# Patient Record
Sex: Male | Born: 1955 | Race: White | Hispanic: No | Marital: Single | Smoking: Former smoker
Health system: Southern US, Community
[De-identification: ages and names within clinical notes are randomized; demographics above are authoritative.]

## PROBLEM LIST (undated history)

## (undated) DIAGNOSIS — I2510 Atherosclerotic heart disease of native coronary artery without angina pectoris: Secondary | ICD-10-CM

## (undated) DIAGNOSIS — I5042 Chronic combined systolic (congestive) and diastolic (congestive) heart failure: Secondary | ICD-10-CM

## (undated) DIAGNOSIS — R911 Solitary pulmonary nodule: Secondary | ICD-10-CM

## (undated) DIAGNOSIS — K8020 Calculus of gallbladder without cholecystitis without obstruction: Secondary | ICD-10-CM

## (undated) DIAGNOSIS — G459 Transient cerebral ischemic attack, unspecified: Secondary | ICD-10-CM

## (undated) DIAGNOSIS — Z972 Presence of dental prosthetic device (complete) (partial): Secondary | ICD-10-CM

## (undated) DIAGNOSIS — I779 Disorder of arteries and arterioles, unspecified: Secondary | ICD-10-CM

## (undated) DIAGNOSIS — J449 Chronic obstructive pulmonary disease, unspecified: Secondary | ICD-10-CM

## (undated) DIAGNOSIS — E785 Hyperlipidemia, unspecified: Secondary | ICD-10-CM

## (undated) DIAGNOSIS — I10 Essential (primary) hypertension: Secondary | ICD-10-CM

## (undated) DIAGNOSIS — I447 Left bundle-branch block, unspecified: Secondary | ICD-10-CM

## (undated) DIAGNOSIS — I739 Peripheral vascular disease, unspecified: Secondary | ICD-10-CM

## (undated) DIAGNOSIS — I219 Acute myocardial infarction, unspecified: Secondary | ICD-10-CM

## (undated) HISTORY — DX: Chronic combined systolic (congestive) and diastolic (congestive) heart failure: I50.42

## (undated) HISTORY — DX: Hyperlipidemia, unspecified: E78.5

## (undated) HISTORY — PX: MULTIPLE TOOTH EXTRACTIONS: SHX2053

## (undated) HISTORY — DX: Atherosclerotic heart disease of native coronary artery without angina pectoris: I25.10

## (undated) HISTORY — DX: Left bundle-branch block, unspecified: I44.7

## (undated) HISTORY — DX: Peripheral vascular disease, unspecified: I73.9

## (undated) HISTORY — DX: Calculus of gallbladder without cholecystitis without obstruction: K80.20

## (undated) HISTORY — DX: Essential (primary) hypertension: I10

## (undated) HISTORY — DX: Solitary pulmonary nodule: R91.1

## (undated) HISTORY — DX: Chronic obstructive pulmonary disease, unspecified: J44.9

## (undated) HISTORY — DX: Transient cerebral ischemic attack, unspecified: G45.9

## (undated) HISTORY — PX: CORONARY ANGIOPLASTY WITH STENT PLACEMENT: SHX49

## (undated) HISTORY — DX: Acute myocardial infarction, unspecified: I21.9

## (undated) HISTORY — DX: Disorder of arteries and arterioles, unspecified: I77.9

---

## 2017-10-15 DIAGNOSIS — J449 Chronic obstructive pulmonary disease, unspecified: Secondary | ICD-10-CM

## 2017-10-15 DIAGNOSIS — K219 Gastro-esophageal reflux disease without esophagitis: Secondary | ICD-10-CM

## 2017-10-15 DIAGNOSIS — I208 Other forms of angina pectoris: Secondary | ICD-10-CM

## 2017-10-15 DIAGNOSIS — I2510 Atherosclerotic heart disease of native coronary artery without angina pectoris: Secondary | ICD-10-CM

## 2017-10-15 DIAGNOSIS — I10 Essential (primary) hypertension: Secondary | ICD-10-CM

## 2017-10-15 DIAGNOSIS — R0609 Other forms of dyspnea: Secondary | ICD-10-CM

## 2017-10-21 ENCOUNTER — Encounter: Payer: Self-pay | Admitting: *Deleted

## 2017-10-21 DIAGNOSIS — R079 Chest pain, unspecified: Secondary | ICD-10-CM

## 2017-10-21 DIAGNOSIS — J449 Chronic obstructive pulmonary disease, unspecified: Secondary | ICD-10-CM | POA: Insufficient documentation

## 2017-10-21 DIAGNOSIS — R0789 Other chest pain: Secondary | ICD-10-CM | POA: Insufficient documentation

## 2017-10-21 DIAGNOSIS — I219 Acute myocardial infarction, unspecified: Secondary | ICD-10-CM | POA: Insufficient documentation

## 2017-10-21 DIAGNOSIS — I509 Heart failure, unspecified: Secondary | ICD-10-CM | POA: Insufficient documentation

## 2017-10-21 DIAGNOSIS — Z972 Presence of dental prosthetic device (complete) (partial): Secondary | ICD-10-CM | POA: Insufficient documentation

## 2017-10-21 DIAGNOSIS — I2510 Atherosclerotic heart disease of native coronary artery without angina pectoris: Secondary | ICD-10-CM

## 2017-10-21 DIAGNOSIS — I25119 Atherosclerotic heart disease of native coronary artery with unspecified angina pectoris: Secondary | ICD-10-CM

## 2017-10-21 DIAGNOSIS — I110 Hypertensive heart disease with heart failure: Secondary | ICD-10-CM

## 2017-10-21 DIAGNOSIS — I10 Essential (primary) hypertension: Secondary | ICD-10-CM

## 2017-10-21 HISTORY — DX: Atherosclerotic heart disease of native coronary artery without angina pectoris: I25.10

## 2017-10-21 HISTORY — DX: Essential (primary) hypertension: I10

## 2017-10-21 HISTORY — DX: Chronic obstructive pulmonary disease, unspecified: J44.9

## 2017-10-21 HISTORY — DX: Atherosclerotic heart disease of native coronary artery with unspecified angina pectoris: I25.119

## 2017-10-21 HISTORY — DX: Hypertensive heart disease with heart failure: I11.0

## 2017-10-21 HISTORY — DX: Chest pain, unspecified: R07.9

## 2017-10-21 HISTORY — DX: Acute myocardial infarction, unspecified: I21.9

## 2017-10-21 HISTORY — DX: Other chest pain: R07.89

## 2017-10-26 ENCOUNTER — Encounter: Payer: Self-pay | Admitting: Cardiology

## 2017-10-26 DIAGNOSIS — I5042 Chronic combined systolic (congestive) and diastolic (congestive) heart failure: Secondary | ICD-10-CM | POA: Insufficient documentation

## 2017-10-26 DIAGNOSIS — I447 Left bundle-branch block, unspecified: Secondary | ICD-10-CM | POA: Insufficient documentation

## 2017-10-26 DIAGNOSIS — I429 Cardiomyopathy, unspecified: Secondary | ICD-10-CM

## 2017-10-26 HISTORY — DX: Chronic combined systolic (congestive) and diastolic (congestive) heart failure: I50.42

## 2017-10-26 HISTORY — DX: Cardiomyopathy, unspecified: I42.9

## 2017-10-26 NOTE — H&P (View-Only) (Signed)
Cardiology Office Note:    Date:  10/27/2017    ID:  Lance Ayala, DOB 10/08/1956, MRN [id number]  PCP:  [name], Manjeet Kaur, MD  Cardiologist:  Brian Munley, MD   Referring MD: No ref. provider found  ASSESSMENT:    1. Chronic systolic heart failure (HCC)   2. Coronary artery disease involving native coronary artery of native heart, angina presence unspecified   3. LBBB (left bundle branch block)   4. Cardiomyopathy, unspecified type (HCC)    PLAN:    In order of problems listed above:  1. Heart failure is presently compensated continue current medications he will undergo coronary angiography for ischemia evaluation and subsequently consideration of both CRT and ICD therapy. 2. Referred for coronary angiography 3. Stable pattern on EKG 4. Severe cardiomyopathy I requested copies of his coronary angiography reports from [facility].  Next appointment   Medication Adjustments/Labs and Tests Ordered: Current medicines are reviewed at length with the patient today.  Concerns regarding medicines are outlined above.  No orders of the defined types were placed in this encounter.  No orders of the defined types were placed in this encounter.    Chief Complaint  Patient presents with  . Follow-up    after recent [facility] admission  . Congestive Heart Failure  . Cardiomyopathy  . Coronary Artery Disease  2 weeks  History of Present Illness:    Lance Ayala is a 61 y.o. male who is being seen today for the evaluation of chest pain and decompensated heart failutre with EF 25% after [facility] admission 10/15/17.  at the request of [facility] hospitalist  Dr Prashant Singh. He was seen by cardiology at [facility] underwent a myocardial perfusion study showing no ischemia and was advised a LifeVest he declined and arrangements made for outpatient cardiology follow-up.  I reviewed [facility] records prior to the visit including EKG echo myocardial perfusion study labs  chest x-ray emergency room admission note cardiology consultation and discharge summary.  There are no records in our electronic health system from previous cardiology evaluation although he does have a diagnosis of coronary artery disease.  He is seen in the office post recent hospitalization.  I do not have access to records but he tells me he was last seen by me 3-4 years ago in 4-5 years ago at heart attack heart catheterization and PCI with multiple stents at [facility].  He had done well until recently when he did orthopnea dyspnea on exertion and subsequently dyspnea at rest.  He presented to [facility] with decompensated heart failure left bundle branch block and severely reduced ejection fraction.  He had no evidence of acute coronary syndrome but he does acknowledge when he is short of breath he would have chest tightness.  A myocardial perfusion study did not show ischemia.  He feels improved now but has just admit that he is weakened and not yet ready to return to work.  He is having no edema orthopnea PND chest pain palpitation or syncope.  After discussion he understands the gravity of his severe cardiomyopathy and heart failure with CAD.  I asked him to undergo coronary angiography as he is at risk for balance ischemia and I am not willing to accept a perfusion study is not adequate ischemia evaluation.  If he does not require further revascularization he should be referred for consideration of CRT ICD ICD therapy and for now we will continue his current medical treatment including beta-blocker ACE   and diuretic.  We will see him back in the office will address upgrading to ARNI.  He was given instructions and daily weights compliance of medications and sodium restriction Past Medical History:  Diagnosis Date  . CAD (coronary artery disease) 10/21/2017  . Chest pain 10/21/2017  . Chest tightness 10/21/2017  . Cholelithiasis   . Congestive heart failure (CHF) (HCC)  10/21/2017  . COPD (chronic obstructive pulmonary disease) (HCC) 10/21/2017  . Hypertension 10/21/2017  . Lung nodule   . Myocardial infarction (HCC) 10/21/2017    Past Surgical History:  Procedure Laterality Date  . CORONARY ANGIOPLASTY WITH STENT PLACEMENT     2 stents    Current Medications: Current Meds  Medication Sig  . aspirin 325 MG tablet Take 325 mg by mouth daily.  . atorvastatin (LIPITOR) 80 MG tablet Take 80 mg by mouth daily.  . carvedilol (COREG) 3.125 MG tablet Take 3.125 mg by mouth 2 (two) times daily with a meal.  . furosemide (LASIX) 40 MG tablet Take 40 mg by mouth daily.  . lisinopril (PRINIVIL,ZESTRIL) 10 MG tablet Take 10 mg by mouth at bedtime.  . nitroGLYCERIN (NITROSTAT) 0.4 MG SL tablet Place 0.4 mg under the tongue every 5 (five) minutes as needed for chest pain.  . spironolactone (ALDACTONE) 25 MG tablet Take 25 mg by mouth daily.     Allergies:   Patient has no known allergies.   Social History   Socioeconomic History  . Marital status: Unknown    Spouse name: None  . Number of children: None  . Years of education: None  . Highest education level: None  Social Needs  . Financial resource strain: None  . Food insecurity - worry: None  . Food insecurity - inability: None  . Transportation needs - medical: None  . Transportation needs - non-medical: None  Occupational History  . None  Tobacco Use  . Smoking status: Former Smoker  . Smokeless tobacco: Never Used  Substance and Sexual Activity  . Alcohol use: No    Frequency: Never  . Drug use: No  . Sexual activity: None  Other Topics Concern  . None  Social History Narrative  . None     Family History: The patient's family history includes Hypertension in his mother; Stroke in his mother.  ROS:   ROS Please see the history of present illness.     All other systems reviewed and are negative.  EKGs/Labs/Other Studies Reviewed:    The following studies were reviewed  today:  EKG today SRTH LBBB QRSD 138 msecs EKG:  EKG 10/15/17 SRTH LBBB QRSD 136 msecs Echo 10/16/17 EF 25%and mild MR MPI: EF 25%, fixed defect no ischemia Recent Labs: K 4.3, Cr 1.2,troponin 0.04-0.08, BNP 2920, Chol 195, LDL 124CBC is normal   Physical Exam:    VS:  BP 110/78 (BP Location: Right Arm, Patient Position: Sitting, Cuff Size: Normal)   Pulse 78   Ht 5' 6" (1.676 m)   Wt 170 lb (77.1 kg)   SpO2 98%   BMI 27.44 kg/m     Wt Readings from Last 3 Encounters:  10/27/17 170 lb (77.1 kg)     GEN:  Well nourished, well developed in no acute distress HEENT: Normal NECK: No JVD; No carotid bruits LYMPHATICS: No lymphadenopathy CARDIAC: Paradoxical second heart sound no S3 no murmur S1 is soft RRR, no murmurs, rubs, gallops RESPIRATORY:  Clear to auscultation without rales, wheezing or rhonchi  ABDOMEN: Soft, non-tender, non-distended MUSCULOSKELETAL:    No edema; No deformity  SKIN: Warm and dry NEUROLOGIC:  Alert and oriented x 3 PSYCHIATRIC:  Normal affect     Signed, Brian Munley, MD  10/27/2017 8:44 AM    [facility] 

## 2017-10-26 NOTE — Progress Notes (Signed)
Cardiology Office Note:    Date:  10/27/2017    ID:  Lance Ayala, DOB 22-May-1956, MRN [id number]  PCP:  Anselmo Pickler, MD  Cardiologist:  Norman Herrlich, MD   Referring MD: No ref. provider found  ASSESSMENT:    1. Chronic systolic heart failure (HCC)   2. Coronary artery disease involving native coronary artery of native heart, angina presence unspecified   3. LBBB (left bundle branch block)   4. Cardiomyopathy, unspecified type (HCC)    PLAN:    In order of problems listed above:  1. Heart failure is presently compensated continue current medications he will undergo coronary angiography for ischemia evaluation and subsequently consideration of both CRT and ICD therapy. 2. Referred for coronary angiography 3. Stable pattern on EKG 4. Severe cardiomyopathy I requested copies of his coronary angiography reports from [facility] hospital.  Next appointment   Medication Adjustments/Labs and Tests Ordered: Current medicines are reviewed at length with the patient today.  Concerns regarding medicines are outlined above.  No orders of the defined types were placed in this encounter.  No orders of the defined types were placed in this encounter.    Chief Complaint  Patient presents with  . Follow-up    after recent [facility] admission  . Congestive Heart Failure  . Cardiomyopathy  . Coronary Artery Disease  2 weeks  History of Present Illness:    Lance Ayala is a 61 y.o. male who is being seen today for the evaluation of chest pain and decompensated heart failutre with EF 25% after [facility] admission 10/15/17.  at the request of [facility] hospitalist  Dr Susa Raring. He was seen by cardiology at [facility] underwent a myocardial perfusion study showing no ischemia and was advised a LifeVest he declined and arrangements made for outpatient cardiology follow-up.  I reviewed [facility] health records prior to the visit including EKG echo myocardial perfusion study labs  chest x-ray emergency room admission note cardiology consultation and discharge summary.  There are no records in our electronic health system from previous cardiology evaluation although he does have a diagnosis of coronary artery disease.  He is seen in the office post recent hospitalization.  I do not have access to records but he tells me he was last seen by me 3-4 years ago in 4-5 years ago at heart attack heart catheterization and PCI with multiple stents at [facility] hospital.  He had done well until recently when he did orthopnea dyspnea on exertion and subsequently dyspnea at rest.  He presented to [facility] with decompensated heart failure left bundle branch block and severely reduced ejection fraction.  He had no evidence of acute coronary syndrome but he does acknowledge when he is short of breath he would have chest tightness.  A myocardial perfusion study did not show ischemia.  He feels improved now but has just admit that he is weakened and not yet ready to return to work.  He is having no edema orthopnea PND chest pain palpitation or syncope.  After discussion he understands the gravity of his severe cardiomyopathy and heart failure with CAD.  I asked him to undergo coronary angiography as he is at risk for balance ischemia and I am not willing to accept a perfusion study is not adequate ischemia evaluation.  If he does not require further revascularization he should be referred for consideration of CRT ICD ICD therapy and for now we will continue his current medical treatment including beta-blocker ACE  and diuretic.  We will see him back in the office will address upgrading to ARNI.  He was given instructions and daily weights compliance of medications and sodium restriction Past Medical History:  Diagnosis Date  . CAD (coronary artery disease) 10/21/2017  . Chest pain 10/21/2017  . Chest tightness 10/21/2017  . Cholelithiasis   . Congestive heart failure (CHF) (HCC)  10/21/2017  . COPD (chronic obstructive pulmonary disease) (HCC) 10/21/2017  . Hypertension 10/21/2017  . Lung nodule   . Myocardial infarction (HCC) 10/21/2017    Past Surgical History:  Procedure Laterality Date  . CORONARY ANGIOPLASTY WITH STENT PLACEMENT     2 stents    Current Medications: Current Meds  Medication Sig  . aspirin 325 MG tablet Take 325 mg by mouth daily.  Marland Kitchen. atorvastatin (LIPITOR) 80 MG tablet Take 80 mg by mouth daily.  . carvedilol (COREG) 3.125 MG tablet Take 3.125 mg by mouth 2 (two) times daily with a meal.  . furosemide (LASIX) 40 MG tablet Take 40 mg by mouth daily.  Marland Kitchen. lisinopril (PRINIVIL,ZESTRIL) 10 MG tablet Take 10 mg by mouth at bedtime.  . nitroGLYCERIN (NITROSTAT) 0.4 MG SL tablet Place 0.4 mg under the tongue every 5 (five) minutes as needed for chest pain.  Marland Kitchen. spironolactone (ALDACTONE) 25 MG tablet Take 25 mg by mouth daily.     Allergies:   Patient has no known allergies.   Social History   Socioeconomic History  . Marital status: Unknown    Spouse name: None  . Number of children: None  . Years of education: None  . Highest education level: None  Social Needs  . Financial resource strain: None  . Food insecurity - worry: None  . Food insecurity - inability: None  . Transportation needs - medical: None  . Transportation needs - non-medical: None  Occupational History  . None  Tobacco Use  . Smoking status: Former Games developermoker  . Smokeless tobacco: Never Used  Substance and Sexual Activity  . Alcohol use: No    Frequency: Never  . Drug use: No  . Sexual activity: None  Other Topics Concern  . None  Social History Narrative  . None     Family History: The patient's family history includes Hypertension in his mother; Stroke in his mother.  ROS:   ROS Please see the history of present illness.     All other systems reviewed and are negative.  EKGs/Labs/Other Studies Reviewed:    The following studies were reviewed  today:  EKG today SRTH LBBB QRSD 138 msecs EKG:  EKG 10/15/17 SRTH LBBB QRSD 136 msecs Echo 10/16/17 EF 25%and mild MR MPI: EF 25%, fixed defect no ischemia Recent Labs: K 4.3, Cr 1.2,troponin 0.04-0.08, BNP 2920, Chol 195, LDL 124CBC is normal   Physical Exam:    VS:  BP 110/78 (BP Location: Right Arm, Patient Position: Sitting, Cuff Size: Normal)   Pulse 78   Ht 5\' 6"  (1.676 m)   Wt 170 lb (77.1 kg)   SpO2 98%   BMI 27.44 kg/m     Wt Readings from Last 3 Encounters:  10/27/17 170 lb (77.1 kg)     GEN:  Well nourished, well developed in no acute distress HEENT: Normal NECK: No JVD; No carotid bruits LYMPHATICS: No lymphadenopathy CARDIAC: Paradoxical second heart sound no S3 no murmur S1 is soft RRR, no murmurs, rubs, gallops RESPIRATORY:  Clear to auscultation without rales, wheezing or rhonchi  ABDOMEN: Soft, non-tender, non-distended MUSCULOSKELETAL:  No edema; No deformity  SKIN: Warm and dry NEUROLOGIC:  Alert and oriented x 3 PSYCHIATRIC:  Normal affect     Signed, Norman Herrlich, MD  10/27/2017 8:44 AM    [facility]

## 2017-10-27 ENCOUNTER — Ambulatory Visit (HOSPITAL_BASED_OUTPATIENT_CLINIC_OR_DEPARTMENT_OTHER)
Admission: RE | Admit: 2017-10-27 | Discharge: 2017-10-27 | Disposition: A | Discharge status: Home / Self Care | Payer: Self-pay | Source: Ambulatory Visit | Attending: Cardiology | Admitting: Cardiology

## 2017-10-27 ENCOUNTER — Encounter: Payer: Self-pay | Admitting: Cardiology

## 2017-10-27 ENCOUNTER — Telehealth: Payer: Self-pay

## 2017-10-27 ENCOUNTER — Ambulatory Visit: Payer: Self-pay | Admitting: Cardiology

## 2017-10-27 VITALS — BP 110/70 | HR 78 | Ht 66.0 in | Wt 170.0 lb

## 2017-10-27 DIAGNOSIS — I429 Cardiomyopathy, unspecified: Secondary | ICD-10-CM | POA: Insufficient documentation

## 2017-10-27 DIAGNOSIS — Z01810 Encounter for preprocedural cardiovascular examination: Secondary | ICD-10-CM

## 2017-10-27 DIAGNOSIS — I2510 Atherosclerotic heart disease of native coronary artery without angina pectoris: Secondary | ICD-10-CM

## 2017-10-27 DIAGNOSIS — I447 Left bundle-branch block, unspecified: Secondary | ICD-10-CM

## 2017-10-27 DIAGNOSIS — I5022 Chronic systolic (congestive) heart failure: Secondary | ICD-10-CM | POA: Insufficient documentation

## 2017-10-27 MED ORDER — ASPIRIN EC 81 MG PO TBEC: 81.0000 mg | DELAYED_RELEASE_TABLET | Freq: Every day | ORAL | 1 refills | Status: DC

## 2017-10-27 NOTE — Patient Instructions (Addendum)
Medication Instructions:  Your physician has recommended you make the following change in your medication:  DECREASE aspirin to 81 mg daily  Labwork: Your physician recommends that you return for lab work in: today. BMP, BNP, CBC, pt/inr  Testing/Procedures: You had an EKG today.  A chest x-ray takes a picture of the organs and structures inside the chest, including the heart, lungs, and blood vessels. This test can show several things, including, whether the heart is enlarges; whether fluid is building up in the lungs; and whether pacemaker / defibrillator leads are still in place.  Your physician has requested that you have a cardiac catheterization. Cardiac catheterization is used to diagnose and/or treat various heart conditions. Doctors may recommend this procedure for a number of different reasons. The most common reason is to evaluate chest pain. Chest pain can be a symptom of coronary artery disease (CAD), and cardiac catheterization can show whether plaque is narrowing or blocking your heart's arteries. This procedure is also used to evaluate the valves, as well as measure the blood flow and oxygen levels in different parts of your heart. For further information please visit [email or web address] Please follow instruction sheet, as given.  Follow-Up: Your physician recommends that you schedule a follow-up appointment in: 2 weeks.  Any Other Special Instructions Will Be Listed Below (If Applicable).     If you need a refill on your cardiac medications before your next appointment, please call your pharmacy.      [facility] CARDIOVASCULAR DIVISION [facility] [place] [address], [address] [place] [address] Dept: [phone] Loc: [phone]  Lance Ayala  10/27/2017  You are scheduled for a Cardiac Catheterization on Thursday, November 29 with Dr. Tonny Bollman.  1. Please arrive at the [facility] (Main Entrance A) at  [facility]: [address] [place], [place] [address] at 5:30 AM (two hours before your procedure to ensure your preparation). Free valet parking service is available.   Special note: Every effort is made to have your procedure done on time. Please understand that emergencies sometimes delay scheduled procedures.  2. Diet: Do not eat or drink anything after midnight prior to your procedure except sips of water to take medications.  3. Labs: None needed. You are having them today.  4. Medication instructions in preparation for your procedure:  On the morning of your procedure, take your Aspirin and any morning medicines NOT listed above.  You may use sips of water.  5. Plan for one night stay--bring personal belongings. 6. Bring a current list of your medications and current insurance cards. 7. You MUST have a responsible person to drive you home. 8. Someone MUST be with you the first 24 hours after you arrive home or your discharge will be delayed. 9. Please wear clothes that are easy to get on and off and wear slip-on shoes.  Thank you for allowing [place] to care for you!   -- [facility]     Heart Failure  Weigh yourself every morning when you first wake up and record on a calender or note pad, bring this to your office visits. Using a pill tender can help with taking your medications consistently.  Limit your fluid intake to 2 liters daily  Limit your sodium intake to less than 2-3 grams daily. Ask if you need dietary teaching.  If you gain more than 3 pounds (from your dry weight ), double your dose of diuretic for  the day.  If you gain more than 5 pounds (from your dry weight), double your dose of lasix and call your heart failure doctor.  Please do not smoke tobacco since it is very bad for your heart.  Please do not drink alcohol since it can worsen your heart failure.Also avoid OTC nonsteroidal drugs, such as advil, aleve and  motrin.  Try to exercise for at least 30 minutes every day because this will help your heart be more efficient. You may be eligible for supervised cardiac rehab, ask your physician.    DASH Diet: Care Instructions Your Care Instructions The DASH diet is an eating plan that can help lower your blood pressure. DASH stands for Dietary Approaches to Stop Hypertension. Hypertension is high blood pressure. The DASH diet focuses on eating foods that are high in calcium, potassium, and magnesium. These nutrients can lower blood pressure. The foods that are highest in these nutrients are fruits, vegetables, low-fat dairy products, nuts, seeds, and legumes. But taking calcium, potassium, and magnesium supplements instead of eating foods that are high in those nutrients does not have the same effect. The DASH diet also includes whole grains, fish, and poultry. The DASH diet is one of several lifestyle changes your doctor may recommend to lower your high blood pressure. Your doctor may also want you to decrease the amount of sodium in your diet. Lowering sodium while following the DASH diet can lower blood pressure even further than just the DASH diet alone. Follow-up care is a key part of your treatment and safety. Be sure to make and go to all appointments, and call your doctor if you are having problems. It's also a good idea to know your test results and keep a list of the medicines you take. How can you care for yourself at home? Following the DASH diet  Eat 4 to 5 servings of fruit each day. A serving is 1 medium-sized piece of fruit,  cup chopped or canned fruit, 1/4 cup dried fruit, or 4 ounces ( cup) of fruit juice. Choose fruit more often than fruit juice.  Eat 4 to 5 servings of vegetables each day. A serving is 1 cup of lettuce or raw leafy vegetables,  cup of chopped or cooked vegetables, or 4 ounces ( cup) of vegetable juice. Choose vegetables more often than vegetable juice.  Get 2 to 3  servings of low-fat and fat-free dairy each day. A serving is 8 ounces of milk, 1 cup of yogurt, or 1  ounces of cheese.  Eat 6 to 8 servings of grains each day. A serving is 1 slice of bread, 1 ounce of dry cereal, or  cup of cooked rice, pasta, or cooked cereal. Try to choose whole-grain products as much as possible.  Limit lean meat, poultry, and fish to 2 servings each day. A serving is 3 ounces, about the size of a deck of cards.  Eat 4 to 5 servings of nuts, seeds, and legumes (cooked dried beans, lentils, and split peas) each week. A serving is 1/3 cup of nuts, 2 tablespoons of seeds, or  cup of cooked beans or peas.  Limit fats and oils to 2 to 3 servings each day. A serving is 1 teaspoon of vegetable oil or 2 tablespoons of salad dressing.  Limit sweets and added sugars to 5 servings or less a week. A serving is 1 tablespoon jelly or jam,  cup sorbet, or 1 cup of lemonade.  Eat less than 2,300 milligrams (mg)  of sodium a day. If you limit your sodium to 1,500 mg a day, you can lower your blood pressure even more. Tips for success  Start small. Do not try to make dramatic changes to your diet all at once. You might feel that you are missing out on your favorite foods and then be more likely to not follow the plan. Make small changes, and stick with them. Once those changes become habit, add a few more changes.  Try some of the following:  Make it a goal to eat a fruit or vegetable at every meal and at snacks. This will make it easy to get the recommended amount of fruits and vegetables each day.  Try yogurt topped with fruit and nuts for a snack or healthy dessert.  Add lettuce, tomato, cucumber, and onion to sandwiches.  Combine a ready-made pizza crust with low-fat mozzarella cheese and lots of vegetable toppings. Try using tomatoes, squash, spinach, broccoli, carrots, cauliflower, and onions.  Have a variety of cut-up vegetables with a low-fat dip as an appetizer instead of  chips and dip.  Sprinkle sunflower seeds or chopped almonds over salads. Or try adding chopped walnuts or almonds to cooked vegetables.  Try some vegetarian meals using beans and peas. Add garbanzo or kidney beans to salads. Make burritos and tacos with mashed pinto beans or black beans. Where can you learn more? Go to https://carlson-fletcher.info/. Enter 276-492-9697 in the search box to learn more about "DASH Diet: Care Instructions." Current as of: February 21, 2015 Content Version: 11.1  2006-2016 [employer]. Care instructions adapted under license by [facility]. If you have questions about a medical condition or this instruction, always ask your healthcare professional. [employer] disclaims any warranty or liability for your use of this information.

## 2017-10-27 NOTE — Telephone Encounter (Signed)
Detailed message left per DPR:  Patient contacted pre-catheterization at [facility] scheduled for:  10/29/2017 @ 0730 Verified arrival time and place:  [facility] @ 0530 Confirmed AM meds to be taken pre-cath with sip of water: Take ASA Hold lasix/spironolactone Patient must have responsible person to drive home post procedure and observe patient for 24 hours Addl concerns:  Left this nurse name and # for call back if any questions

## 2017-10-28 LAB — CBC WITH DIFFERENTIAL/PLATELET
BASOS ABS: 0 10*3/uL (ref 0.0–0.2)
Basos: 0 %
EOS (ABSOLUTE): 0.1 10*3/uL (ref 0.0–0.4)
Eos: 1 %
HEMOGLOBIN: 15 g/dL (ref 13.0–17.7)
Hematocrit: 43.9 % (ref 37.5–51.0)
Immature Grans (Abs): 0 10*3/uL (ref 0.0–0.1)
Immature Granulocytes: 0 %
LYMPHS ABS: 2.1 10*3/uL (ref 0.7–3.1)
Lymphs: 22 %
MCH: 31.3 pg (ref 26.6–33.0)
MCHC: 34.2 g/dL (ref 31.5–35.7)
MCV: 92 fL (ref 79–97)
MONOCYTES: 9 %
Monocytes Absolute: 0.8 10*3/uL (ref 0.1–0.9)
NEUTROS ABS: 6.4 10*3/uL (ref 1.4–7.0)
Neutrophils: 68 %
PLATELETS: 291 10*3/uL (ref 150–379)
RBC: 4.79 x10E6/uL (ref 4.14–5.80)
RDW: 13.1 % (ref 12.3–15.4)
WBC: 9.4 10*3/uL (ref 3.4–10.8)

## 2017-10-28 LAB — BASIC METABOLIC PANEL
BUN/Creatinine Ratio: 15 (ref 10–24)
BUN: 19 mg/dL (ref 8–27)
CHLORIDE: 97 mmol/L (ref 96–106)
CO2: 28 mmol/L (ref 20–29)
CREATININE: 1.28 mg/dL — AB (ref 0.76–1.27)
Calcium: 9.9 mg/dL (ref 8.6–10.2)
GFR calc Af Amer: 69 mL/min/{1.73_m2} (ref >59)
GFR calc non Af Amer: 60 mL/min/{1.73_m2} (ref >59)
GLUCOSE: 115 mg/dL — AB (ref 65–99)
Potassium: 4.2 mmol/L (ref 3.5–5.2)
SODIUM: 139 mmol/L (ref 134–144)

## 2017-10-28 LAB — PROTIME-INR
INR: 1 (ref 0.8–1.2)
PROTHROMBIN TIME: 10.3 s (ref 9.1–12.0)

## 2017-10-28 LAB — PRO B NATRIURETIC PEPTIDE: NT-PRO BNP: 1072 pg/mL — AB (ref 0–210)

## 2017-10-29 ENCOUNTER — Encounter (HOSPITAL_COMMUNITY): Payer: Self-pay | Admitting: Cardiovascular Disease

## 2017-10-29 ENCOUNTER — Encounter (HOSPITAL_COMMUNITY)
Admission: RE | Disposition: A | Discharge status: Home / Self Care | Payer: Self-pay | Source: Ambulatory Visit | Attending: Cardiovascular Disease

## 2017-10-29 ENCOUNTER — Ambulatory Visit (HOSPITAL_COMMUNITY)
Admission: RE | Admit: 2017-10-29 | Discharge: 2017-10-29 | Disposition: A | Discharge status: Home / Self Care | Payer: Medicaid Other | Source: Ambulatory Visit | Attending: Cardiovascular Disease | Admitting: Cardiovascular Disease

## 2017-10-29 DIAGNOSIS — I110 Hypertensive heart disease with heart failure: Secondary | ICD-10-CM | POA: Diagnosis not present

## 2017-10-29 DIAGNOSIS — I2584 Coronary atherosclerosis due to calcified coronary lesion: Secondary | ICD-10-CM | POA: Insufficient documentation

## 2017-10-29 DIAGNOSIS — I447 Left bundle-branch block, unspecified: Secondary | ICD-10-CM | POA: Insufficient documentation

## 2017-10-29 DIAGNOSIS — I252 Old myocardial infarction: Secondary | ICD-10-CM | POA: Diagnosis not present

## 2017-10-29 DIAGNOSIS — Z87891 Personal history of nicotine dependence: Secondary | ICD-10-CM | POA: Insufficient documentation

## 2017-10-29 DIAGNOSIS — Z7982 Long term (current) use of aspirin: Secondary | ICD-10-CM | POA: Diagnosis not present

## 2017-10-29 DIAGNOSIS — J449 Chronic obstructive pulmonary disease, unspecified: Secondary | ICD-10-CM | POA: Diagnosis not present

## 2017-10-29 DIAGNOSIS — Z955 Presence of coronary angioplasty implant and graft: Secondary | ICD-10-CM | POA: Insufficient documentation

## 2017-10-29 DIAGNOSIS — I2510 Atherosclerotic heart disease of native coronary artery without angina pectoris: Secondary | ICD-10-CM | POA: Insufficient documentation

## 2017-10-29 DIAGNOSIS — I509 Heart failure, unspecified: Secondary | ICD-10-CM

## 2017-10-29 DIAGNOSIS — I5022 Chronic systolic (congestive) heart failure: Secondary | ICD-10-CM | POA: Insufficient documentation

## 2017-10-29 DIAGNOSIS — I429 Cardiomyopathy, unspecified: Secondary | ICD-10-CM | POA: Insufficient documentation

## 2017-10-29 HISTORY — PX: LEFT HEART CATH AND CORONARY ANGIOGRAPHY: CATH118249

## 2017-10-29 SURGERY — LEFT HEART CATH AND CORONARY ANGIOGRAPHY
Anesthesia: LOCAL

## 2017-10-29 MED ORDER — SODIUM CHLORIDE 0.9% FLUSH: 3.0000 mL | Freq: Two times a day (BID) | INTRAVENOUS | Status: DC

## 2017-10-29 MED ORDER — VERAPAMIL HCL 2.5 MG/ML IV SOLN
INTRAVENOUS | Status: DC | PRN
  Administered 2017-10-29: 10 mL via INTRA_ARTERIAL

## 2017-10-29 MED ORDER — SODIUM CHLORIDE 0.9 % IV SOLN: 250.0000 mL | INTRAVENOUS | Status: DC | PRN

## 2017-10-29 MED ORDER — VERAPAMIL HCL 2.5 MG/ML IV SOLN
INTRAVENOUS | Status: AC
  Filled 2017-10-29: qty 2

## 2017-10-29 MED ORDER — FENTANYL CITRATE (PF) 100 MCG/2ML IJ SOLN
INTRAMUSCULAR | Status: DC | PRN
  Administered 2017-10-29: 25 ug via INTRAVENOUS

## 2017-10-29 MED ORDER — HEPARIN SODIUM (PORCINE) 1000 UNIT/ML IJ SOLN
INTRAMUSCULAR | Status: AC
  Filled 2017-10-29: qty 1

## 2017-10-29 MED ORDER — ASPIRIN 81 MG PO CHEW: 81.0000 mg | CHEWABLE_TABLET | ORAL | Status: DC

## 2017-10-29 MED ORDER — FENTANYL CITRATE (PF) 100 MCG/2ML IJ SOLN
INTRAMUSCULAR | Status: AC
  Filled 2017-10-29: qty 2

## 2017-10-29 MED ORDER — IOPAMIDOL (ISOVUE-370) INJECTION 76%
INTRAVENOUS | Status: DC | PRN
  Administered 2017-10-29: 55 mL via INTRA_ARTERIAL

## 2017-10-29 MED ORDER — HEPARIN (PORCINE) IN NACL 2-0.9 UNIT/ML-% IJ SOLN
INTRAMUSCULAR | Status: AC | PRN
  Administered 2017-10-29: 1000 mL

## 2017-10-29 MED ORDER — HEPARIN SODIUM (PORCINE) 1000 UNIT/ML IJ SOLN
INTRAMUSCULAR | Status: DC | PRN
  Administered 2017-10-29: 4000 [IU] via INTRAVENOUS

## 2017-10-29 MED ORDER — MIDAZOLAM HCL 2 MG/2ML IJ SOLN
INTRAMUSCULAR | Status: DC | PRN
  Administered 2017-10-29: 2 mg via INTRAVENOUS

## 2017-10-29 MED ORDER — IOPAMIDOL (ISOVUE-370) INJECTION 76%
INTRAVENOUS | Status: AC
  Filled 2017-10-29: qty 100

## 2017-10-29 MED ORDER — LIDOCAINE HCL (PF) 1 % IJ SOLN
INTRAMUSCULAR | Status: DC | PRN
  Administered 2017-10-29: 2 mL via SUBCUTANEOUS

## 2017-10-29 MED ORDER — MIDAZOLAM HCL 2 MG/2ML IJ SOLN
INTRAMUSCULAR | Status: AC
  Filled 2017-10-29: qty 2

## 2017-10-29 MED ORDER — SODIUM CHLORIDE 0.9 % IV SOLN
INTRAVENOUS | Status: DC
  Administered 2017-10-29: 07:00:00 via INTRAVENOUS

## 2017-10-29 MED ORDER — HEPARIN (PORCINE) IN NACL 2-0.9 UNIT/ML-% IJ SOLN
INTRAMUSCULAR | Status: AC
  Filled 2017-10-29: qty 1000

## 2017-10-29 MED ORDER — LIDOCAINE HCL (PF) 1 % IJ SOLN
INTRAMUSCULAR | Status: AC
  Filled 2017-10-29: qty 30

## 2017-10-29 MED ORDER — SODIUM CHLORIDE 0.9% FLUSH: 3.0000 mL | INTRAVENOUS | Status: DC | PRN

## 2017-10-29 SURGICAL SUPPLY — 10 items

## 2017-10-29 NOTE — Interval H&P Note (Signed)
Cath Lab Visit (complete for each Cath Lab visit)  Clinical Evaluation Leading to the Procedure:   ACS: No.  Non-ACS:    Anginal Classification: CCS III  Anti-ischemic medical therapy: Minimal Therapy (1 class of medications)  Non-Invasive Test Results: High-risk stress test findings: cardiac mortality >3%/year  Prior CABG: No previous CABG      History and Physical Interval Note:  10/29/2017 7:41 AM  Lance Ayala  has presented today for surgery, with the diagnosis of hf - cad - cm  The various methods of treatment have been discussed with the patient and family. After consideration of risks, benefits and other options for treatment, the patient has consented to  Procedure(s): LEFT HEART CATH AND CORONARY ANGIOGRAPHY (N/A) as a surgical intervention .  The patient's history has been reviewed, patient examined, no change in status, stable for surgery.  I have reviewed the patient's chart and labs.  Questions were answered to the patient's satisfaction.     Tonny Bollman

## 2017-10-29 NOTE — Discharge Instructions (Signed)

## 2017-11-10 ENCOUNTER — Ambulatory Visit: Payer: Self-pay | Admitting: Cardiology

## 2017-11-12 NOTE — Progress Notes (Signed)
Cardiology Office Note:    Date:  11/13/2017   ID:  Lance Ayala, DOB 1956-10-27, MRN [id number]  PCP:  Anselmo Pickler, MD  Cardiologist:  Norman Herrlich, MD    Referring MD: Anselmo Pickler, *please do a BMP and BNP at her office visit   ASSESSMENT:    1. Coronary artery disease involving native coronary artery of native heart with angina pectoris (HCC)   2. Chronic systolic heart failure (HCC)   3. Hypertensive heart disease with heart failure (HCC)   4. Dyslipidemia    PLAN:    In order of problems listed above:  1. Continue medical treatment for now.  With severe multivessel CAD and left ventricular dysfunction he is referred to CTS has an appointment for this week regarding bypass surgery.  The patient is at peace with this and understands the thought process and agrees to intervention.  His ejection fraction was recovered will provide guidance if he needs to be considered for ICD. 2. Stable compensated continue current medical treatment with loop diuretic beta-blocker ACE inhibitor MRA and if ejection fraction remains depressed after recovery from bypass surgery will transition to ARNI.  Require referral to cardiac rehabilitation both for heart failure and CAD/CABG 3. Stable continue current medical treatment 4. Stable continue high intensity statin with his CAD, I will reassess a lipid profile once he recovers goal LDL less than 50.   Next appointment: 6 weeks anticipated to be after bypass surgery   Medication Adjustments/Labs and Tests Ordered: Current medicines are reviewed at length with the patient today.  Concerns regarding medicines are outlined above.  No orders of the defined types were placed in this encounter.  No orders of the defined types were placed in this encounter.   No chief complaint on file.   History of Present Illness:    Lance Ayala is a 61 y.o. male with a hx of heart failure EF 25% last seen 3 weeks ago and referred for coronary  angiography.. Conclusion     Previously placed Ost RCA to Prox RCA stent (unknown type) is widely patent.  Mid RCA lesion is 60% stenosed.  Dist RCA lesion is 80% stenosed.  Ost LAD lesion is 60% stenosed.  Ost Ramus to Ramus lesion is 90% stenosed.  Ost 1st Mrg to 1st Mrg lesion is 90% stenosed.  Ost 1st Diag to 1st Diag lesion is 75% stenosed.  Prox LAD to Mid LAD lesion is 75% stenosed.  Mid LM to Dist LM lesion is 50% stenosed.  The left ventricular ejection fraction is 25-35% by visual estimate.  LV end diastolic pressure is normal.  There is severe left ventricular systolic dysfunction.   1. Severe multivessel CAD as outlined above with moderate distal left main stenosis, severe ramus/OM1 stenoses, severe RCA stenosis, and severe LAD/diagonal stenosis 2. Severe segmental LV systolic dysfunction 3. Normal LVEDP  Recommend: cardiac surgical consultation for consideration of CABG    Compliance with diet, lifestyle and medications: Yes  Symptomatically he is improved he had further no further anginal discomfort he is weak and fatigued but no shortness of breath edema orthopnea PND palpitations syncope or TIA Past Medical History:  Diagnosis Date  . CAD (coronary artery disease) 10/21/2017  . Chest pain 10/21/2017  . Chest tightness 10/21/2017  . Cholelithiasis   . Congestive heart failure (CHF) (HCC) 10/21/2017  . COPD (chronic obstructive pulmonary disease) (HCC) 10/21/2017  . Hypertension 10/21/2017  . Lung nodule   . Myocardial infarction (HCC) 10/21/2017  Past Surgical History:  Procedure Laterality Date  . CORONARY ANGIOPLASTY WITH STENT PLACEMENT     2 stents  . LEFT HEART CATH AND CORONARY ANGIOGRAPHY N/A 10/29/2017   Procedure: LEFT HEART CATH AND CORONARY ANGIOGRAPHY;  Surgeon: Tonny Bollmanooper, Michael, MD;  [facility] LAB;  Service: Cardiovascular;  Laterality: N/A;    Current Medications: No outpatient medications have been marked as  taking for the 11/13/17 encounter (Appointment) with Baldo DaubMunley, Jerene Yeager J, MD.     Allergies:   Patient has no known allergies.   Social History   Socioeconomic History  . Marital status: Single    Spouse name: Not on file  . Number of children: Not on file  . Years of education: Not on file  . Highest education level: Not on file  Social Needs  . Financial resource strain: Not on file  . Food insecurity - worry: Not on file  . Food insecurity - inability: Not on file  . Transportation needs - medical: Not on file  . Transportation needs - non-medical: Not on file  Occupational History  . Not on file  Tobacco Use  . Smoking status: Former Games developermoker  . Smokeless tobacco: Never Used  Substance and Sexual Activity  . Alcohol use: No    Frequency: Never  . Drug use: No  . Sexual activity: Not on file  Other Topics Concern  . Not on file  Social History Narrative  . Not on file     Family History: The patient's family history includes Hypertension in his mother; Stroke in his mother. ROS:   Please see the history of present illness.    All other systems reviewed and are negative.  EKGs/Labs/Other Studies Reviewed:    The following studies were reviewed today:   Recent Labs: 10/27/2017: BUN 19; Creatinine, Ser 1.28; Hemoglobin 15.0; NT-Pro BNP 1,072; Platelets 291; Potassium 4.2; Sodium 139  Recent Lipid Panel No results found for: CHOL, TRIG, HDL, CHOLHDL, VLDL, LDLCALC, LDLDIRECT  Physical Exam:    VS:  There were no vitals taken for this visit.    Wt Readings from Last 3 Encounters:  10/29/17 170 lb (77.1 kg)  10/27/17 170 lb (77.1 kg)     GEN:  Well nourished, well developed in no acute distress HEENT: Normal NECK: No JVD; No carotid bruits LYMPHATICS: No lymphadenopathy CARDIAC: Soft S1 no S3 present RRR, no murmurs, rubs, gallops RESPIRATORY:  Clear to auscultation without rales, wheezing or rhonchi  ABDOMEN: Soft, non-tender, non-distended MUSCULOSKELETAL:   No edema; No deformity  SKIN: Warm and dry NEUROLOGIC:  Alert and oriented x 3 PSYCHIATRIC:  Normal affect    Signed, Norman HerrlichBrian Laneah Luft, MD  11/13/2017 2:38 PM    [facility]

## 2017-11-13 ENCOUNTER — Encounter: Payer: Self-pay | Admitting: Cardiology

## 2017-11-13 ENCOUNTER — Ambulatory Visit (INDEPENDENT_AMBULATORY_CARE_PROVIDER_SITE_OTHER): Payer: Self-pay | Admitting: Cardiology

## 2017-11-13 VITALS — BP 110/68 | HR 86 | Ht 66.0 in | Wt 170.0 lb

## 2017-11-13 DIAGNOSIS — I110 Hypertensive heart disease with heart failure: Secondary | ICD-10-CM

## 2017-11-13 DIAGNOSIS — E785 Hyperlipidemia, unspecified: Secondary | ICD-10-CM | POA: Insufficient documentation

## 2017-11-13 DIAGNOSIS — I25119 Atherosclerotic heart disease of native coronary artery with unspecified angina pectoris: Secondary | ICD-10-CM

## 2017-11-13 DIAGNOSIS — I5022 Chronic systolic (congestive) heart failure: Secondary | ICD-10-CM

## 2017-11-13 MED ORDER — ATORVASTATIN CALCIUM 80 MG PO TABS: 80.0000 mg | ORAL_TABLET | Freq: Every evening | ORAL | 1 refills | Status: DC

## 2017-11-13 MED ORDER — LISINOPRIL 10 MG PO TABS: 10.0000 mg | ORAL_TABLET | Freq: Every day | ORAL | 1 refills | Status: DC

## 2017-11-13 MED ORDER — SPIRONOLACTONE 25 MG PO TABS: 25.0000 mg | ORAL_TABLET | Freq: Every day | ORAL | 1 refills | Status: DC

## 2017-11-13 MED ORDER — FUROSEMIDE 40 MG PO TABS: 40.0000 mg | ORAL_TABLET | Freq: Every day | ORAL | 1 refills | Status: DC

## 2017-11-13 MED ORDER — CARVEDILOL 3.125 MG PO TABS: 3.1250 mg | ORAL_TABLET | Freq: Two times a day (BID) | ORAL | 2 refills | Status: DC

## 2017-11-13 NOTE — Patient Instructions (Signed)
Medication Instructions:  Your physician recommends that you continue on your current medications as directed. Please refer to the Current Medication list given to you today. Refills sent  Labwork: Your physician recommends that you have the following labs drawn: BNP and BMP  Testing/Procedures: None  Follow-Up: Your physician recommends that you schedule a follow-up appointment in: 6 weeks  Any Other Special Instructions Will Be Listed Below (If Applicable).     If you need a refill on your cardiac medications before your next appointment, please call your pharmacy.   [facility]  Garey Ham, RN, BSN

## 2017-11-14 LAB — BASIC METABOLIC PANEL
BUN / CREAT RATIO: 17 (ref 10–24)
BUN: 24 mg/dL (ref 8–27)
CHLORIDE: 101 mmol/L (ref 96–106)
CO2: 23 mmol/L (ref 20–29)
Calcium: 9.5 mg/dL (ref 8.6–10.2)
Creatinine, Ser: 1.42 mg/dL — ABNORMAL HIGH (ref 0.76–1.27)
GFR calc Af Amer: 61 mL/min/{1.73_m2} (ref >59)
GFR calc non Af Amer: 53 mL/min/{1.73_m2} — ABNORMAL LOW (ref >59)
GLUCOSE: 114 mg/dL — AB (ref 65–99)
POTASSIUM: 4.3 mmol/L (ref 3.5–5.2)
SODIUM: 142 mmol/L (ref 134–144)

## 2017-11-14 LAB — PRO B NATRIURETIC PEPTIDE: NT-PRO BNP: 614 pg/mL — AB (ref 0–210)

## 2017-11-18 ENCOUNTER — Institutional Professional Consult (permissible substitution) (INDEPENDENT_AMBULATORY_CARE_PROVIDER_SITE_OTHER): Payer: Self-pay | Admitting: Cardiothoracic Surgery

## 2017-11-18 ENCOUNTER — Other Ambulatory Visit: Payer: Self-pay

## 2017-11-18 ENCOUNTER — Encounter: Payer: Self-pay | Admitting: Cardiothoracic Surgery

## 2017-11-18 ENCOUNTER — Other Ambulatory Visit: Payer: Self-pay | Admitting: *Deleted

## 2017-11-18 VITALS — BP 149/81 | HR 75 | Ht 66.0 in | Wt 170.0 lb

## 2017-11-18 DIAGNOSIS — I255 Ischemic cardiomyopathy: Secondary | ICD-10-CM

## 2017-11-18 DIAGNOSIS — I25118 Atherosclerotic heart disease of native coronary artery with other forms of angina pectoris: Secondary | ICD-10-CM

## 2017-11-18 DIAGNOSIS — Z01818 Encounter for other preprocedural examination: Secondary | ICD-10-CM

## 2017-11-18 NOTE — Progress Notes (Signed)
PCP is [name], [name], MD Referring Provider is Tonny Bollman, MD  Chief Complaint  Patient presents with  . New Patient (Initial Visit)    cath 10/29/2017    patient examined , angiograms and CT scan of chest personally revieed HPI:  61 year old nondiabetic reformed smoker with recent diagnosis f moderate to severe three-vessel CAD with EF 25% and LVEDP 16 mmHg. Has history of previous PCi at [facility]. Echocardiogram not available but no history of valular disease.   Patient's symptoms mainly of fatigue and poor exercise tolerance. Weight stable. No chest pain. No significant edema. No history of significant alcohol   CT of chest images demonstrate moderate changes of COPD , moderate hilar adenopaty nodes 1.  Past Medical History:  Diagnosis Date  . CAD (coronary artery disease) 10/21/2017  . Chest pain 10/21/2017  . Chest tightness 10/21/2017  . Cholelithiasis   . Congestive heart failure (CHF) (HCC) 10/21/2017  . COPD (chronic obstructive pulmonary disease) (HCC) 10/21/2017  . Hypertension 10/21/2017  . Lung nodule   . Myocardial infarction (HCC) 10/21/2017    Past Surgical History:  Procedure Laterality Date  . CORONARY ANGIOPLASTY WITH STENT PLACEMENT     2 stents  . LEFT HEART CATH AND CORONARY ANGIOGRAPHY N/A 10/29/2017   Procedure: LEFT HEART CATH AND CORONARY ANGIOGRAPHY;  Surgeon: Tonny Bollman, MD;  Location: [facility] INVASIVE CV LAB;  Service: Cardiovascular;  Laterality: N/A;    Family History  Problem Relation Age of Onset  . Hypertension Mother   . Stroke Mother     Social History Social History   Tobacco Use  . Smoking status: Former Smoker    Packs/day: 1.00    Years: 20.00    Pack years: 20.00    Last attempt to quit: 12/01/1998    Years since quitting: 18.9  . Smokeless tobacco: Never Used  Substance Use Topics  . Alcohol use: No    Frequency: Never  . Drug use: No    Current Outpatient Medications  Medication Sig Dispense Refill   . aspirin EC 81 MG tablet Take 81 mg by mouth daily.    Marland Kitchen atorvastatin (LIPITOR) 80 MG tablet Take 1 tablet (80 mg total) by mouth every evening. 90 tablet 1  . carvedilol (COREG) 3.125 MG tablet Take 1 tablet (3.125 mg total) by mouth 2 (two) times daily with a meal. 120 tablet 2  . furosemide (LASIX) 40 MG tablet Take 1 tablet (40 mg total) by mouth daily. 90 tablet 1  . lisinopril (PRINIVIL,ZESTRIL) 10 MG tablet Take 1 tablet (10 mg total) by mouth at bedtime. 90 tablet 1  . nitroGLYCERIN (NITROSTAT) 0.4 MG SL tablet Place 0.4 mg under the tongue every 5 (five) minutes as needed for chest pain.    Marland Kitchen spironolactone (ALDACTONE) 25 MG tablet Take 1 tablet (25 mg total) by mouth daily. 90 tablet 1   No current facility-administered medications for this visit.     No Known Allergies  Review of Systems        Review of Systems :  [ y ] = yes, [  ] = no        General :  Weight gain [   ]    Weight loss  [   ]  Fatigue [ y ]  Fever [  ]  Chills  [  ]  Weakness  Cove.Etienne[y  ]           HEENT    Headache [  ]  Dizziness [  ]  Blurred vision [  ] Glaucoma  [  ]                          Nosebleeds [  ] Painful or loose teeth Cove.Etienne[y ]        Cardiac :  Chest pain/ pressure [  ]  Resting SOB [  ] exertional SOB Cove.Etienne[y  ]                        Orthopnea [  ]  Pedal edema  [  ]  Palpitations [  ] Syncope/presyncope [ ]                         Paroxysmal nocturnal dyspnea [  ]         Pulmonary : cough [  ]  wheezing [  ]  Hemoptysis [  ] Sputum [  ] Snoring [  ]                              Pneumothorax [  ]  Sleep apnea [  ]        GI : Vomiting [  ]  Dysphagia [  ]  Melena  [  ]  Abdominal pain [  ] BRBPR [  ]              Heart burn [  ]  Constipation [ y ] Diarrhea  [  ] Colonoscopy [   ]        GU : Hematuria [  ]  Dysuria [  ]  Nocturia [  ] UTI's [  ]        Vascular : Claudication [  ]  Rest pain [  ]  DVT [  ] Vein stripping [  ] leg ulcers [  ]                           TIA [  ] Stroke Cove.Etienne[y  ]  Varicose veins [  ]        NEURO :  Headaches  Cove.Etienne[y  ] Seizures [  ] Vision changes [  ] Paresthesias [  ]                                       Seizures [  ]        Musculoskeletal :  Arthritis Cove.Etienne[y  ] Gout  [  ]  Back pain [  ]  Joint pain [  ]        Skin :  Rash [  ]  Melanoma [  ] Sores [  ]        Heme : Bleeding problems [  ]Clotting Disorders [  ] Anemia [  ]Blood Transfusion [ ]         Endocrine : Diabetes [  ] Heat or Cold intolerance [  ] Polyuria [  ]excessive thirst [ ]         Psych : Depression [  ]  Anxiety [  ]  Psych hospitalizations [  ] Memory change [  ]       Right hnd dominant        No history of previous thoracic trauma         No history of previous surgery                                         BP (!) 149/81 (BP Location: Left Arm, Patient Position: Sitting, Cuff Size: Normal)   Pulse 75   Ht 5\' 6"  (1.676 m)   Wt 170 lb (77.1 kg)   SpO2 96%   BMI 27.44 kg/m  Physical Exam       Physical Exam  General: short overweight WM NaD HEENT: Normocephalic pupils equal , dentition adequate Neck: Supple without JVD, adenopathy, loud right carotid bruit Chest: Clear to auscultation, symmetrical breath sounds, no rhonchi, no tenderness             or deformity Cardiovascular: Regular rate and rhythm, no murmur, no gallop, peripheral pulses             palpable in all extremities Abdomen:  Soft, nontender, no palpable mass or organomegaly Extremities: Warm, well-perfused, no clubbing cyanosis edema or tenderness,              no venous stasis changes of the legs Rectal/GU: Deferred Neuro: Grossly non--focal and symmetrical throughout Skin: Clean and dry without rash or ulceration       Diagnostic Tests: Three vessel CAD, graftable vessels EF .25  Impression: Will need echocardiogram and carotid dopplers the return to office to set date for CABG  Plan:  return early Jan  Mikey Bussing, MD [facility] [phone]

## 2017-11-27 ENCOUNTER — Ambulatory Visit (HOSPITAL_BASED_OUTPATIENT_CLINIC_OR_DEPARTMENT_OTHER): Payer: Medicaid Other

## 2017-11-27 ENCOUNTER — Ambulatory Visit (HOSPITAL_COMMUNITY)
Admission: RE | Admit: 2017-11-27 | Discharge: 2017-11-27 | Disposition: A | Discharge status: Home / Self Care | Payer: Medicaid Other | Source: Ambulatory Visit | Attending: Cardiothoracic Surgery | Admitting: Cardiothoracic Surgery

## 2017-11-27 ENCOUNTER — Other Ambulatory Visit: Payer: Self-pay

## 2017-11-27 DIAGNOSIS — I6523 Occlusion and stenosis of bilateral carotid arteries: Secondary | ICD-10-CM | POA: Diagnosis not present

## 2017-11-27 DIAGNOSIS — J449 Chronic obstructive pulmonary disease, unspecified: Secondary | ICD-10-CM | POA: Insufficient documentation

## 2017-11-27 DIAGNOSIS — I255 Ischemic cardiomyopathy: Secondary | ICD-10-CM | POA: Diagnosis not present

## 2017-11-27 DIAGNOSIS — I25118 Atherosclerotic heart disease of native coronary artery with other forms of angina pectoris: Secondary | ICD-10-CM

## 2017-11-27 DIAGNOSIS — Z01818 Encounter for other preprocedural examination: Secondary | ICD-10-CM

## 2017-11-27 DIAGNOSIS — I2510 Atherosclerotic heart disease of native coronary artery without angina pectoris: Secondary | ICD-10-CM | POA: Insufficient documentation

## 2017-11-27 DIAGNOSIS — R0789 Other chest pain: Secondary | ICD-10-CM | POA: Diagnosis not present

## 2017-11-27 DIAGNOSIS — I10 Essential (primary) hypertension: Secondary | ICD-10-CM | POA: Insufficient documentation

## 2017-11-27 DIAGNOSIS — R079 Chest pain, unspecified: Secondary | ICD-10-CM | POA: Diagnosis not present

## 2017-11-27 DIAGNOSIS — Z87891 Personal history of nicotine dependence: Secondary | ICD-10-CM | POA: Insufficient documentation

## 2017-11-27 LAB — VAS US CAROTID
LEFT ECA DIAS: -34 cm/s
Left CCA dist dias: 34 cm/s
Left CCA dist sys: 110 cm/s
Left CCA prox dias: 34 cm/s
Left CCA prox sys: 106 cm/s
Left ICA dist dias: -23 cm/s
Left ICA dist sys: -91 cm/s
Left ICA prox dias: -92 cm/s
Left ICA prox sys: -309 cm/s
RIGHT CCA MID DIAS: 30 cm/s
RIGHT ECA DIAS: -56 cm/s
Right CCA prox dias: 22 cm/s
Right CCA prox sys: 100 cm/s
Right cca dist sys: -79 cm/s

## 2017-11-27 MED ORDER — PERFLUTREN LIPID MICROSPHERE
1.0000 mL | INTRAVENOUS | Status: AC | PRN
  Administered 2017-11-27: 3 mL via INTRAVENOUS

## 2017-12-09 ENCOUNTER — Ambulatory Visit (INDEPENDENT_AMBULATORY_CARE_PROVIDER_SITE_OTHER): Payer: Self-pay | Admitting: Cardiothoracic Surgery

## 2017-12-09 ENCOUNTER — Encounter: Payer: Self-pay | Admitting: Cardiothoracic Surgery

## 2017-12-09 ENCOUNTER — Other Ambulatory Visit: Payer: Self-pay

## 2017-12-09 VITALS — BP 113/69 | HR 68 | Resp 18 | Ht 66.0 in | Wt 161.6 lb

## 2017-12-09 DIAGNOSIS — I6521 Occlusion and stenosis of right carotid artery: Secondary | ICD-10-CM

## 2017-12-09 DIAGNOSIS — I2510 Atherosclerotic heart disease of native coronary artery without angina pectoris: Secondary | ICD-10-CM

## 2017-12-09 NOTE — Progress Notes (Signed)
PCP is [name], [name], MD Referring Provider is Tonny Bollman, MD  Chief Complaint  Patient presents with  . Follow-up    Echo & Carotid US 11/27/2017, Discuss CABG    HPI: 62 year old patient returns to discuss scheduling CABG. The patient has history of ischemic cardiomyopathy with ejection fraction of 25-30 percent and shortness of breath with exertion. The patient is not having chest pain. The patient returns after a updated echocardiogram and pre-CABG carotid Doppler studies. The patient was noted to have a loud right carotid bruit on exam as well as a previous history of left sided weakness transient TIA.  The patient's echocardiogram shows EF of 30% without significant valvular disease. Overall LV function is satisfactory for surgical coronary revascularization operation.  The patient's carotid Doppler however shows a tight right ICA stenosis probably 95% with a diastolic velocity of 144.  The patient denies any recent TIAs.  The patient's coronary artery disease is not critical but moderate. He is a candidate for CABG because of his LV dysfunction and three-vessel CAD.  The patient will be referred for evaluation of carotid endarterectomy either prior to CABG or simultaneously with CABG.  The patient will continue his current medications until his vascular surgical evaluation. Past Medical History:  Diagnosis Date  . CAD (coronary artery disease) 10/21/2017  . Chest pain 10/21/2017  . Chest tightness 10/21/2017  . Cholelithiasis   . Congestive heart failure (CHF) (HCC) 10/21/2017  . COPD (chronic obstructive pulmonary disease) (HCC) 10/21/2017  . Hypertension 10/21/2017  . Lung nodule   . Myocardial infarction (HCC) 10/21/2017    Past Surgical History:  Procedure Laterality Date  . CORONARY ANGIOPLASTY WITH STENT PLACEMENT     2 stents  . LEFT HEART CATH AND CORONARY ANGIOGRAPHY N/A 10/29/2017   Procedure: LEFT HEART CATH AND CORONARY ANGIOGRAPHY;  Surgeon:  Tonny Bollman, MD;  Location: [facility] INVASIVE CV LAB;  Service: Cardiovascular;  Laterality: N/A;    Family History  Problem Relation Age of Onset  . Hypertension Mother   . Stroke Mother     Social History Social History   Tobacco Use  . Smoking status: Former Smoker    Packs/day: 1.00    Years: 20.00    Pack years: 20.00    Last attempt to quit: 12/01/1998    Years since quitting: 19.0  . Smokeless tobacco: Never Used  Substance Use Topics  . Alcohol use: No    Frequency: Never  . Drug use: No    Current Outpatient Medications  Medication Sig Dispense Refill  . aspirin EC 81 MG tablet Take 81 mg by mouth daily.    Marland Kitchen atorvastatin (LIPITOR) 80 MG tablet Take 1 tablet (80 mg total) by mouth every evening. 90 tablet 1  . carvedilol (COREG) 3.125 MG tablet Take 1 tablet (3.125 mg total) by mouth 2 (two) times daily with a meal. 120 tablet 2  . furosemide (LASIX) 40 MG tablet Take 1 tablet (40 mg total) by mouth daily. 90 tablet 1  . lisinopril (PRINIVIL,ZESTRIL) 10 MG tablet Take 1 tablet (10 mg total) by mouth at bedtime. 90 tablet 1  . nitroGLYCERIN (NITROSTAT) 0.4 MG SL tablet Place 0.4 mg under the tongue every 5 (five) minutes as needed for chest pain.    Marland Kitchen spironolactone (ALDACTONE) 25 MG tablet Take 1 tablet (25 mg total) by mouth daily. 90 tablet 1   No current facility-administered medications for this visit.     No Known Allergies  Review of Systems  Dyspnea on exertion No chest pain  BP 113/69 (BP Location: Right Arm, Patient Position: Sitting, Cuff Size: Normal)   Pulse 68   Resp 18   Ht 5\' 6"  (1.676 m)   Wt 161 lb 9.6 oz (73.3 kg)   SpO2 97% Comment: RA  BMI 26.08 kg/m  Physical Exam      Exam    General- alert and comfortable    Neck- right carotid bruit   Lungs- clear without rales, wheezes   Cor- regular rate and rhythm, no murmur , gallop   Abdomen- soft, non-tender   Extremities - warm, non-tender, minimal edema   Neuro- oriented, appropriate,  no focal weakness   Diagnostic Tests: Results of carotid ultrasound and echocardiogram discussed with patient. Images of echocardiogram personally reviewed.  Impression:  Patient has ischemic cardiomyopathy with moderate 3 vessel CAD and would benefit from surgical coronary revascularization for improved survival and preservation of LV function. However he may have a critical carotid stenosis which needs evaluation by vascular surgery to determine timing of CABG. l Plan: We'll review scheduling CABG with patient after recommendation is made by vascular surgery regarding right carotid artery stenosis.  Mikey Bussing, MD [facility] [phone]

## 2017-12-17 ENCOUNTER — Other Ambulatory Visit: Payer: Self-pay

## 2017-12-17 ENCOUNTER — Other Ambulatory Visit: Payer: Self-pay | Admitting: *Deleted

## 2017-12-17 ENCOUNTER — Ambulatory Visit (INDEPENDENT_AMBULATORY_CARE_PROVIDER_SITE_OTHER): Payer: Self-pay | Admitting: Vascular Surgery

## 2017-12-17 ENCOUNTER — Encounter: Payer: Self-pay | Admitting: Vascular Surgery

## 2017-12-17 ENCOUNTER — Encounter: Payer: Self-pay | Admitting: *Deleted

## 2017-12-17 VITALS — BP 130/74 | HR 76 | Temp 98.7°F | Resp 18 | Ht 66.0 in | Wt 159.0 lb

## 2017-12-17 DIAGNOSIS — I6523 Occlusion and stenosis of bilateral carotid arteries: Secondary | ICD-10-CM

## 2017-12-17 NOTE — Progress Notes (Signed)
Vitals:   12/17/17 0942 12/17/17 0943 12/17/17 0945  BP: (!) 154/71 (!) 147/76 130/74  Pulse: 75 75 76  Resp: 18    Temp: 98.7 F (37.1 C)    TempSrc: Oral    Weight: 159 lb (72.1 kg)    Height: 5\' 6"  (1.676 m)

## 2017-12-17 NOTE — Progress Notes (Signed)
[facility] [place]  Patient name: Lance Ayala MRN: [id number] DOB: [birth year] Sex: male  REASON FOR CONSULT: Valuation asymptomatic critical right carotid stenosis  HPI: Lance Ayala is a 62 y.o. male, who is here today for discussion of his critical carotid stenosis.  He is a very pleasant 62 year old gentleman who has history of heart failure.  He underwent cardiac catheterization revealing three-vessel disease.  No evidence of critical narrowing.  Has been recommended that he undergo coronary artery bypass grafting with Dr. Donata Clay.  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  He is right-handed.  He does report an episode around 2000 of left-sided weakness.  He reports a workup at that time including carotid duplex showed no cause.  He has had no new episodes in the nearly 20 years since then.  He quit smoking around this same period of time.  Does report a strong family history of premature cardiac disease and stroke.  Claudication  Past Medical History:  Diagnosis Date  . CAD (coronary artery disease) 10/21/2017  . Chest pain 10/21/2017  . Chest tightness 10/21/2017  . Cholelithiasis   . Congestive heart failure (CHF) (HCC) 10/21/2017  . COPD (chronic obstructive pulmonary disease) (HCC) 10/21/2017  . Hypertension 10/21/2017  . Lung nodule   . Myocardial infarction (HCC) 10/21/2017    Family History  Problem Relation Age of Onset  . Hypertension Mother   . Stroke Mother     SOCIAL HISTORY: Social History   Socioeconomic History  . Marital status: Single    Spouse name: Not on file  . Number of children: Not on file  . Years of education: Not on file  . Highest education level: Not on file  Social Needs  . Financial resource strain: Not on file  . Food insecurity - worry: Not on file  . Food insecurity - inability:  Not on file  . Transportation needs - medical: Not on file  . Transportation needs - non-medical: Not on file  Occupational History  . Not on file  Tobacco Use  . Smoking status: Former Smoker    Packs/day: 1.00    Years: 20.00    Pack years: 20.00    Last attempt to quit: 12/01/1998    Years since quitting: 19.0  . Smokeless tobacco: Never Used  Substance and Sexual Activity  . Alcohol use: No    Frequency: Never  . Drug use: No  . Sexual activity: Not on file  Other Topics Concern  . Not on file  Social History Narrative  . Not on file    No Known Allergies  Current Outpatient Medications  Medication Sig Dispense Refill  . aspirin EC 81 MG tablet Take 81 mg by mouth daily.    Marland Kitchen atorvastatin (LIPITOR) 80 MG tablet Take 1 tablet (80 mg total) by mouth every evening. 90 tablet 1  . carvedilol (COREG) 3.125 MG tablet Take 1 tablet (3.125 mg total) by mouth 2 (two) times daily with a meal. 120 tablet 2  . furosemide (LASIX) 40 MG tablet Take 1 tablet (40 mg total) by mouth daily. 90 tablet 1  . lisinopril (PRINIVIL,ZESTRIL) 10 MG tablet Take 1 tablet (10 mg total) by mouth at bedtime. 90 tablet 1  . nitroGLYCERIN (NITROSTAT) 0.4 MG SL tablet Place 0.4 mg under the tongue every 5 (five) minutes as needed for chest pain.    Marland Kitchen spironolactone (ALDACTONE) 25  MG tablet Take 1 tablet (25 mg total) by mouth daily. 90 tablet 1   No current facility-administered medications for this visit.     REVIEW OF SYSTEMS:  [X]  denotes positive finding, [ ]  denotes negative finding Cardiac  Comments:  Chest pain or chest pressure:    Shortness of breath upon exertion: x   Short of breath when lying flat:    Irregular heart rhythm:        Vascular    Pain in calf, thigh, or hip brought on by ambulation:    Pain in feet at night that wakes you up from your sleep:     Blood clot in your veins:    Leg swelling:         Pulmonary    Oxygen at home:    Productive cough:     Wheezing:           Neurologic    Sudden weakness in arms or legs:     Sudden numbness in arms or legs:     Sudden onset of difficulty speaking or slurred speech:    Temporary loss of vision in one eye:     Problems with dizziness:         Gastrointestinal    Blood in stool:     Vomited blood:         Genitourinary    Burning when urinating:     Blood in urine:        Psychiatric    Major depression:         Hematologic    Bleeding problems:    Problems with blood clotting too easily:        Skin    Rashes or ulcers:        Constitutional    Fever or chills:      PHYSICAL EXAM: Vitals:   12/17/17 0942 12/17/17 0943 12/17/17 0945  BP: (!) 154/71 (!) 147/76 130/74  Pulse: 75 75 76  Resp: 18    Temp: 98.7 F (37.1 C)    TempSrc: Oral    Weight: 159 lb (72.1 kg)    Height: 5\' 6"  (1.676 m)      GENERAL: The patient is a well-nourished male, in no acute distress. The vital signs are documented above. CARDIOVASCULAR: Right carotid bruit I do not appreciate a left carotid bruit.  He has 2+ dorsalis pedis pulses.  He has a palpable right dorsalis pedis and palpable left posterior tibial pulse PULMONARY: There is good air exchange  ABDOMEN: Soft and non-tender  MUSCULOSKELETAL: There are no major deformities or cyanosis. NEUROLOGIC: No focal weakness or paresthesias are detected. SKIN: There are no ulcers or rashes noted. PSYCHIATRIC: The patient has a normal affect.  DATA:  Reviewed his carotid duplex.  This reveals critical stenosis with markedly elevated end diastolic velocities right he does have a moderate stenosis on the left  MEDICAL ISSUES: I discussed this case with Dr. [name].  We feel that it is most appropriate for him to proceed with initial right carotid endarterectomy and then staged coronary artery bypass grafting for reduction of stroke risk and progressive cardiac difficulties.  I discussed this at length with the patient including the expected admission the day of  surgery and expected discharge the day following.  I discussed 1-2% risk of stroke with surgery and the potential for nerve injury.  He understands and wishes to schedule within the next 1-2 weeks.  We will coordinate this with the  patient   [name], MD [facility] Tel [phone] Pager [phone]

## 2017-12-17 NOTE — H&P (View-Only) (Signed)
  [facility] [place]  Patient name: Lance Ayala MRN: [id number] DOB: 04/12/1956 Sex: male  REASON FOR CONSULT: Valuation asymptomatic critical right carotid stenosis  HPI: Lance Ayala is a 62 y.o. male, who is here today for discussion of his critical carotid stenosis.  He is a very pleasant 62-year-old gentleman who has history of heart failure.  He underwent cardiac catheterization revealing three-vessel disease.  No evidence of critical narrowing.  Has been recommended that he undergo coronary artery bypass grafting with Dr. Van Trigt.  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  He is right-handed.  He does report an episode around 2000 of left-sided weakness.  He reports a workup at that time including carotid duplex showed no cause.  He has had no new episodes in the nearly 20 years since then.  He quit smoking around this same period of time.  Does report a strong family history of premature cardiac disease and stroke.  Claudication  Past Medical History:  Diagnosis Date  . CAD (coronary artery disease) 10/21/2017  . Chest pain 10/21/2017  . Chest tightness 10/21/2017  . Cholelithiasis   . Congestive heart failure (CHF) (HCC) 10/21/2017  . COPD (chronic obstructive pulmonary disease) (HCC) 10/21/2017  . Hypertension 10/21/2017  . Lung nodule   . Myocardial infarction (HCC) 10/21/2017    Family History  Problem Relation Age of Onset  . Hypertension Mother   . Stroke Mother     SOCIAL HISTORY: Social History   Socioeconomic History  . Marital status: Single    Spouse name: Not on file  . Number of children: Not on file  . Years of education: Not on file  . Highest education level: Not on file  Social Needs  . Financial resource strain: Not on file  . Food insecurity - worry: Not on file  . Food insecurity - inability:  Not on file  . Transportation needs - medical: Not on file  . Transportation needs - non-medical: Not on file  Occupational History  . Not on file  Tobacco Use  . Smoking status: Former Smoker    Packs/day: 1.00    Years: 20.00    Pack years: 20.00    Last attempt to quit: 12/01/1998    Years since quitting: 19.0  . Smokeless tobacco: Never Used  Substance and Sexual Activity  . Alcohol use: No    Frequency: Never  . Drug use: No  . Sexual activity: Not on file  Other Topics Concern  . Not on file  Social History Narrative  . Not on file    No Known Allergies  Current Outpatient Medications  Medication Sig Dispense Refill  . aspirin EC 81 MG tablet Take 81 mg by mouth daily.    . atorvastatin (LIPITOR) 80 MG tablet Take 1 tablet (80 mg total) by mouth every evening. 90 tablet 1  . carvedilol (COREG) 3.125 MG tablet Take 1 tablet (3.125 mg total) by mouth 2 (two) times daily with a meal. 120 tablet 2  . furosemide (LASIX) 40 MG tablet Take 1 tablet (40 mg total) by mouth daily. 90 tablet 1  . lisinopril (PRINIVIL,ZESTRIL) 10 MG tablet Take 1 tablet (10 mg total) by mouth at bedtime. 90 tablet 1  . nitroGLYCERIN (NITROSTAT) 0.4 MG SL tablet Place 0.4 mg under the tongue every 5 (five) minutes as needed for chest pain.    . spironolactone (ALDACTONE) 25   MG tablet Take 1 tablet (25 mg total) by mouth daily. 90 tablet 1   No current facility-administered medications for this visit.     REVIEW OF SYSTEMS:  [X] denotes positive finding, [ ] denotes negative finding Cardiac  Comments:  Chest pain or chest pressure:    Shortness of breath upon exertion: x   Short of breath when lying flat:    Irregular heart rhythm:        Vascular    Pain in calf, thigh, or hip brought on by ambulation:    Pain in feet at night that wakes you up from your sleep:     Blood clot in your veins:    Leg swelling:         Pulmonary    Oxygen at home:    Productive cough:     Wheezing:           Neurologic    Sudden weakness in arms or legs:     Sudden numbness in arms or legs:     Sudden onset of difficulty speaking or slurred speech:    Temporary loss of vision in one eye:     Problems with dizziness:         Gastrointestinal    Blood in stool:     Vomited blood:         Genitourinary    Burning when urinating:     Blood in urine:        Psychiatric    Major depression:         Hematologic    Bleeding problems:    Problems with blood clotting too easily:        Skin    Rashes or ulcers:        Constitutional    Fever or chills:      PHYSICAL EXAM: Vitals:   12/17/17 0942 12/17/17 0943 12/17/17 0945  BP: (!) 154/71 (!) 147/76 130/74  Pulse: 75 75 76  Resp: 18    Temp: 98.7 F (37.1 C)    TempSrc: Oral    Weight: 159 lb (72.1 kg)    Height: 5' 6" (1.676 m)      GENERAL: The patient is a well-nourished male, in no acute distress. The vital signs are documented above. CARDIOVASCULAR: Right carotid bruit I do not appreciate a left carotid bruit.  He has 2+ dorsalis pedis pulses.  He has a palpable right dorsalis pedis and palpable left posterior tibial pulse PULMONARY: There is good air exchange  ABDOMEN: Soft and non-tender  MUSCULOSKELETAL: There are no major deformities or cyanosis. NEUROLOGIC: No focal weakness or paresthesias are detected. SKIN: There are no ulcers or rashes noted. PSYCHIATRIC: The patient has a normal affect.  DATA:  Reviewed his carotid duplex.  This reveals critical stenosis with markedly elevated end diastolic velocities right he does have a moderate stenosis on the left  MEDICAL ISSUES: I discussed this case with Dr. Vantrigt.  We feel that it is most appropriate for him to proceed with initial right carotid endarterectomy and then staged coronary artery bypass grafting for reduction of stroke risk and progressive cardiac difficulties.  I discussed this at length with the patient including the expected admission the day of  surgery and expected discharge the day following.  I discussed 1-2% risk of stroke with surgery and the potential for nerve injury.  He understands and wishes to schedule within the next 1-2 weeks.  We will coordinate this with the   patient   Gertrude Bucks F. Lorin Gawron, MD FACS [facility] [place] Office Tel [phone] Pager [phone]    

## 2017-12-21 ENCOUNTER — Encounter: Payer: Self-pay | Admitting: Vascular Surgery

## 2017-12-24 NOTE — Pre-Procedure Instructions (Signed)
    Jhonatan Taing  12/24/2017      [facility] [place], [place] - [address] [place] [address] Phone: [phone] Fax: [phone]    Your procedure is scheduled on Monday, December 28, 2017  Report to [facility] Admitting at 5:30 A.M.  Call this number if you have problems the morning of surgery:  [phone]   Remember:  Do not eat food or drink liquids after midnight Sunday, December 27, 2017  Take these medicines the morning of surgery with A SIP OF WATER : aspirin,  carvedilol (COREG),  if needed: acetaminophen (TYLENOL) for pain,  nitroGLYCERIN (NITROSTAT) for chest pain Stop taking vitamins, fish oil and herbal medications. Do not take any NSAIDs ie: Ibuprofen, Advil, Naproxen (Aleve), Motrin, BC and Goody Powder; stop now.  Do not wear jewelry, make-up or nail polish.  Do not wear lotions, powders, or perfumes, or deodorant.  Do not shave 48 hours prior to surgery.  Men may shave face and neck.  Do not bring valuables to the hospital.  [facility] is not responsible for any belongings or valuables.  Contacts, dentures or bridgework may not be worn into surgery.  Leave your suitcase in the car.  After surgery it may be brought to your room. For patients admitted to the hospital, discharge time will be determined by your treatment team. Patients discharged the day of surgery will not be allowed to drive home.  Special instructions: Shower the night before surgery and the morning of surgery with CHG. Please read over the following fact sheets that you were given. Pain Booklet, Coughing and Deep Breathing, Blood Transfusion Information, MRSA Information and Surgical Site Infection Prevention

## 2017-12-25 ENCOUNTER — Encounter (HOSPITAL_COMMUNITY)
Admission: RE | Admit: 2017-12-25 | Discharge: 2017-12-25 | Disposition: A | Discharge status: Home / Self Care | Payer: Medicaid Other | Source: Ambulatory Visit | Attending: Vascular Surgery | Admitting: Vascular Surgery

## 2017-12-25 ENCOUNTER — Encounter (HOSPITAL_COMMUNITY): Payer: Self-pay

## 2017-12-25 ENCOUNTER — Ambulatory Visit: Payer: Self-pay | Admitting: Cardiology

## 2017-12-25 ENCOUNTER — Other Ambulatory Visit: Payer: Self-pay

## 2017-12-25 DIAGNOSIS — I6521 Occlusion and stenosis of right carotid artery: Secondary | ICD-10-CM | POA: Diagnosis not present

## 2017-12-25 DIAGNOSIS — Z01812 Encounter for preprocedural laboratory examination: Secondary | ICD-10-CM | POA: Insufficient documentation

## 2017-12-25 HISTORY — DX: Presence of dental prosthetic device (complete) (partial): Z97.2

## 2017-12-25 LAB — URINALYSIS, ROUTINE W REFLEX MICROSCOPIC
Bilirubin Urine: NEGATIVE
Glucose, UA: NEGATIVE mg/dL
Hgb urine dipstick: NEGATIVE
KETONES UR: NEGATIVE mg/dL
LEUKOCYTES UA: NEGATIVE
NITRITE: NEGATIVE
PROTEIN: NEGATIVE mg/dL
Specific Gravity, Urine: 1.023 (ref 1.005–1.030)
pH: 5 (ref 5.0–8.0)

## 2017-12-25 LAB — COMPREHENSIVE METABOLIC PANEL
ALK PHOS: 82 U/L (ref 38–126)
ALT: 21 U/L (ref 17–63)
AST: 21 U/L (ref 15–41)
Albumin: 4 g/dL (ref 3.5–5.0)
Anion gap: 14 (ref 5–15)
BUN: 21 mg/dL — ABNORMAL HIGH (ref 6–20)
CALCIUM: 9.3 mg/dL (ref 8.9–10.3)
CO2: 24 mmol/L (ref 22–32)
Chloride: 99 mmol/L — ABNORMAL LOW (ref 101–111)
Creatinine, Ser: 1.07 mg/dL (ref 0.61–1.24)
GFR calc non Af Amer: >60 mL/min (ref >60)
GLUCOSE: 109 mg/dL — AB (ref 65–99)
Potassium: 3.6 mmol/L (ref 3.5–5.1)
SODIUM: 137 mmol/L (ref 135–145)
Total Bilirubin: 0.6 mg/dL (ref 0.3–1.2)
Total Protein: 7.4 g/dL (ref 6.5–8.1)

## 2017-12-25 LAB — CBC
HCT: 43.9 % (ref 39.0–52.0)
HEMOGLOBIN: 14.5 g/dL (ref 13.0–17.0)
MCH: 31 pg (ref 26.0–34.0)
MCHC: 33 g/dL (ref 30.0–36.0)
MCV: 94 fL (ref 78.0–100.0)
Platelets: 201 10*3/uL (ref 150–400)
RBC: 4.67 MIL/uL (ref 4.22–5.81)
RDW: 13.1 % (ref 11.5–15.5)
WBC: 9.3 10*3/uL (ref 4.0–10.5)

## 2017-12-25 LAB — TYPE AND SCREEN
ABO/RH(D): A NEG
Antibody Screen: NEGATIVE

## 2017-12-25 LAB — SURGICAL PCR SCREEN
MRSA, PCR: NEGATIVE
STAPHYLOCOCCUS AUREUS: NEGATIVE

## 2017-12-25 LAB — ABO/RH: ABO/RH(D): A NEG

## 2017-12-25 LAB — PROTIME-INR
INR: 0.94
Prothrombin Time: 12.4 seconds (ref 11.4–15.2)

## 2017-12-25 LAB — APTT: aPTT: 29 seconds (ref 24–36)

## 2017-12-25 NOTE — Progress Notes (Signed)
Anesthesia Chart Review:  Pt is a 62 year old male scheduled for R CEA on 12/28/2017 with Gretta Began, MD  Note pt needs CEA prior to planned CABG based on Dr. Bosie Helper note 12/17/17 (in discussion with Dr. Donata Clay).   - PCP is Keane Police, MD - Cardiologist is Norman Herrlich, MD - CT surgeon is Kerin Perna, MD  PMH includes:  CAD, CHF, HTN, stroke, COPD, lung nodule. Former smoker. BMI 25.5  Medications include: ASA 81mg , lipitor, carvedilol, lasix, lisinopril, spironolactone  BP (!) 119/47 Comment: right arm  Pulse 67   Temp 37.1 C   Resp 18   Ht 5\' 6"  (1.676 m)   Wt 158 lb 1.6 oz (71.7 kg)   SpO2 98%   BMI 25.52 kg/m   Preoperative labs reviewed.    CXR 10/27/17:  - Findings most consistent with pleural-parenchymal scarring. No acute cardiopulmonary disease identified.  EKG 10/27/17: NSR. LBBB.   Echo 11/27/17:  - Left ventricle: The cavity size was normal. Wall thickness was normal. Systolic function was moderately reduced. The estimated ejection fraction was in the range of 35% to 40%. No mural thrombus with Definity contrast. Diffuse hypokinesis. Diffusely abnormal GLS at -12%. Doppler parameters are consistent with abnormal left ventricular relaxation (grade 1 diastolic dysfunction). The E/e&' ratio is >15, suggesting elevated LV filling pressure. - Aortic valve: Sclerosis without stenosis. Transvalvular velocity was minimally increased. - Mitral valve: Mildly thickened leaflets . There was trivial regurgitation. - Left atrium: The atrium was normal in size. - Inferior vena cava: The vessel was normal in size. The respirophasic diameter changes were in the normal range (= 50%), consistent with normal central venous pressure. - Impressions: LVEF 35-40%, normal wall thickness, global hypokinesis, no mural thrombus, abnormal GLS at -12%, grade 1 DD, elevated LV filling pressure, aortic valve sclerosis, trivial MR, normal LA size, normal IVC.  Carotid duplex 11/27/17:   1.  R ICA 80-99% stenosis. 2.  L ICA 60-79% stenosis. 3.  Bilateral external carotid arteries <50% stenosis. 4.  Velocities may be underestimated bilaterally due to acoustic shadowing  Cardiac cath 10/29/17:   Previously placed Ost RCA to Prox RCA stent (unknown type) is widely patent.  Mid RCA lesion is 60% stenosed.  Dist RCA lesion is 80% stenosed.  Ost LAD lesion is 60% stenosed.  Ost Ramus to Ramus lesion is 90% stenosed.  Ost 1st Mrg to 1st Mrg lesion is 90% stenosed.  Ost 1st Diag to 1st Diag lesion is 75% stenosed.  Prox LAD to Mid LAD lesion is 75% stenosed.  Mid LM to Dist LM lesion is 50% stenosed.  The left ventricular ejection fraction is 25-35% by visual estimate.  LV end diastolic pressure is normal.  There is severe left ventricular systolic dysfunction. 1. Severe multivessel CAD as outlined above with moderate distal left main stenosis, severe ramus/OM1 stenoses, severe RCA stenosis, and severe LAD/diagonal stenosis 2. Severe segmental LV systolic dysfunction 3. Normal LVEDP - Recommend: cardiac surgical consultation for consideration of CABG  If no changes, I anticipate pt can proceed with surgery as scheduled.   Rica Mast, FNP-BC [facility]/Anesthesiology Phone: [phone] 12/25/2017 12:13 PM

## 2017-12-25 NOTE — Progress Notes (Signed)
Pt denies any acute cardiopulmonary issues,. Pt under the care of Dr. Dulce Sellar, Cardiology. Pt denies recent labs. Anesthesia asked to review pt history.

## 2017-12-28 ENCOUNTER — Inpatient Hospital Stay (HOSPITAL_COMMUNITY): Payer: Medicaid Other | Admitting: Emergency Medicine

## 2017-12-28 ENCOUNTER — Encounter (HOSPITAL_COMMUNITY)
Admission: RE | Disposition: A | Discharge status: Home / Self Care | Payer: Self-pay | Source: Ambulatory Visit | Attending: Vascular Surgery

## 2017-12-28 ENCOUNTER — Encounter (HOSPITAL_COMMUNITY): Payer: Self-pay

## 2017-12-28 ENCOUNTER — Inpatient Hospital Stay (HOSPITAL_COMMUNITY)
Admission: RE | Admit: 2017-12-28 | Discharge: 2017-12-29 | DRG: 039 | Disposition: A | Discharge status: Home / Self Care | Payer: Medicaid Other | Source: Ambulatory Visit | Attending: Vascular Surgery | Admitting: Vascular Surgery

## 2017-12-28 ENCOUNTER — Telehealth: Payer: Self-pay | Admitting: Vascular Surgery

## 2017-12-28 ENCOUNTER — Inpatient Hospital Stay (HOSPITAL_COMMUNITY): Payer: Medicaid Other | Admitting: Certified Registered"

## 2017-12-28 DIAGNOSIS — Z87891 Personal history of nicotine dependence: Secondary | ICD-10-CM

## 2017-12-28 DIAGNOSIS — Z79899 Other long term (current) drug therapy: Secondary | ICD-10-CM

## 2017-12-28 DIAGNOSIS — Z8249 Family history of ischemic heart disease and other diseases of the circulatory system: Secondary | ICD-10-CM | POA: Diagnosis not present

## 2017-12-28 DIAGNOSIS — Z7982 Long term (current) use of aspirin: Secondary | ICD-10-CM | POA: Diagnosis not present

## 2017-12-28 DIAGNOSIS — I6529 Occlusion and stenosis of unspecified carotid artery: Secondary | ICD-10-CM | POA: Diagnosis present

## 2017-12-28 DIAGNOSIS — J449 Chronic obstructive pulmonary disease, unspecified: Secondary | ICD-10-CM | POA: Diagnosis present

## 2017-12-28 DIAGNOSIS — I252 Old myocardial infarction: Secondary | ICD-10-CM | POA: Diagnosis not present

## 2017-12-28 DIAGNOSIS — I509 Heart failure, unspecified: Secondary | ICD-10-CM | POA: Diagnosis present

## 2017-12-28 DIAGNOSIS — Z8673 Personal history of transient ischemic attack (TIA), and cerebral infarction without residual deficits: Secondary | ICD-10-CM | POA: Diagnosis not present

## 2017-12-28 DIAGNOSIS — I2510 Atherosclerotic heart disease of native coronary artery without angina pectoris: Secondary | ICD-10-CM | POA: Diagnosis present

## 2017-12-28 DIAGNOSIS — I110 Hypertensive heart disease with heart failure: Secondary | ICD-10-CM | POA: Diagnosis present

## 2017-12-28 DIAGNOSIS — I6521 Occlusion and stenosis of right carotid artery: Secondary | ICD-10-CM

## 2017-12-28 HISTORY — PX: ENDARTERECTOMY: SHX5162

## 2017-12-28 HISTORY — PX: PATCH ANGIOPLASTY: SHX6230

## 2017-12-28 HISTORY — DX: Occlusion and stenosis of unspecified carotid artery: I65.29

## 2017-12-28 LAB — CBC
HCT: 33.1 % — ABNORMAL LOW (ref 39.0–52.0)
HEMOGLOBIN: 11.2 g/dL — AB (ref 13.0–17.0)
MCH: 31.5 pg (ref 26.0–34.0)
MCHC: 33.8 g/dL (ref 30.0–36.0)
MCV: 93.2 fL (ref 78.0–100.0)
Platelets: 171 10*3/uL (ref 150–400)
RBC: 3.55 MIL/uL — AB (ref 4.22–5.81)
RDW: 13.2 % (ref 11.5–15.5)
WBC: 11.1 10*3/uL — ABNORMAL HIGH (ref 4.0–10.5)

## 2017-12-28 LAB — CREATININE, SERUM
CREATININE: 1.11 mg/dL (ref 0.61–1.24)
GFR calc Af Amer: >60 mL/min (ref >60)

## 2017-12-28 SURGERY — ENDARTERECTOMY, CAROTID
Anesthesia: General | Site: Neck | Laterality: Right

## 2017-12-28 MED ORDER — ONDANSETRON HCL 4 MG/2ML IJ SOLN
INTRAMUSCULAR | Status: DC | PRN
  Administered 2017-12-28: 4 mg via INTRAVENOUS

## 2017-12-28 MED ORDER — ALUM & MAG HYDROXIDE-SIMETH 200-200-20 MG/5ML PO SUSP: 15.0000 mL | ORAL | Status: DC | PRN

## 2017-12-28 MED ORDER — FENTANYL CITRATE (PF) 250 MCG/5ML IJ SOLN
INTRAMUSCULAR | Status: AC
  Filled 2017-12-28: qty 5

## 2017-12-28 MED ORDER — SODIUM CHLORIDE 0.9 % IV SOLN
INTRAVENOUS | Status: DC
  Administered 2017-12-28: 15:00:00 via INTRAVENOUS

## 2017-12-28 MED ORDER — DOCUSATE SODIUM 100 MG PO CAPS: 100.0000 mg | ORAL_CAPSULE | Freq: Every day | ORAL | Status: DC

## 2017-12-28 MED ORDER — PROPOFOL 10 MG/ML IV BOLUS
INTRAVENOUS | Status: DC | PRN
  Administered 2017-12-28: 130 mg via INTRAVENOUS
  Administered 2017-12-28: 20 mg via INTRAVENOUS

## 2017-12-28 MED ORDER — PROMETHAZINE HCL 25 MG/ML IJ SOLN: 6.2500 mg | INTRAMUSCULAR | Status: DC | PRN

## 2017-12-28 MED ORDER — LIDOCAINE 2% (20 MG/ML) 5 ML SYRINGE
INTRAMUSCULAR | Status: AC
  Filled 2017-12-28: qty 5

## 2017-12-28 MED ORDER — NITROGLYCERIN 0.4 MG SL SUBL: 0.4000 mg | SUBLINGUAL_TABLET | SUBLINGUAL | Status: DC | PRN

## 2017-12-28 MED ORDER — DEXTROSE 5 % IV SOLN
1.5000 g | Freq: Two times a day (BID) | INTRAVENOUS | Status: AC
  Administered 2017-12-28 – 2017-12-29 (×2): 1.5 g via INTRAVENOUS
  Filled 2017-12-28 (×2): qty 1.5

## 2017-12-28 MED ORDER — LIDOCAINE HCL (CARDIAC) 20 MG/ML IV SOLN
INTRAVENOUS | Status: DC | PRN
  Administered 2017-12-28: 80 mg via INTRAVENOUS
  Administered 2017-12-28: 20 mg via INTRAVENOUS

## 2017-12-28 MED ORDER — HYDROMORPHONE HCL 1 MG/ML IJ SOLN
INTRAMUSCULAR | Status: AC
  Filled 2017-12-28: qty 1

## 2017-12-28 MED ORDER — SODIUM CHLORIDE 0.9 % IV SOLN
500.0000 mL | Freq: Once | INTRAVENOUS | Status: AC | PRN
  Administered 2017-12-28 (×2): 500 mL via INTRAVENOUS

## 2017-12-28 MED ORDER — BISACODYL 5 MG PO TBEC: 5.0000 mg | DELAYED_RELEASE_TABLET | Freq: Every day | ORAL | Status: DC | PRN

## 2017-12-28 MED ORDER — CHLORHEXIDINE GLUCONATE 4 % EX LIQD: 60.0000 mL | Freq: Once | CUTANEOUS | Status: DC

## 2017-12-28 MED ORDER — CARVEDILOL 3.125 MG PO TABS
3.1250 mg | ORAL_TABLET | Freq: Two times a day (BID) | ORAL | Status: DC
  Administered 2017-12-28 – 2017-12-29 (×2): 3.125 mg via ORAL
  Filled 2017-12-28 (×2): qty 1

## 2017-12-28 MED ORDER — PROPOFOL 10 MG/ML IV BOLUS
INTRAVENOUS | Status: AC
  Filled 2017-12-28: qty 20

## 2017-12-28 MED ORDER — SENNOSIDES-DOCUSATE SODIUM 8.6-50 MG PO TABS: 1.0000 | ORAL_TABLET | Freq: Every evening | ORAL | Status: DC | PRN

## 2017-12-28 MED ORDER — MORPHINE SULFATE (PF) 4 MG/ML IV SOLN: 1.0000 mg | INTRAVENOUS | Status: DC | PRN

## 2017-12-28 MED ORDER — LACTATED RINGERS IV SOLN
INTRAVENOUS | Status: DC | PRN
  Administered 2017-12-28: 07:00:00 via INTRAVENOUS

## 2017-12-28 MED ORDER — HEPARIN SODIUM (PORCINE) 1000 UNIT/ML IJ SOLN
INTRAMUSCULAR | Status: AC
  Filled 2017-12-28: qty 2

## 2017-12-28 MED ORDER — SUGAMMADEX SODIUM 200 MG/2ML IV SOLN
INTRAVENOUS | Status: AC
  Filled 2017-12-28: qty 2

## 2017-12-28 MED ORDER — PHENYLEPHRINE HCL 10 MG/ML IJ SOLN
INTRAVENOUS | Status: DC | PRN
  Administered 2017-12-28: 40 ug/min via INTRAVENOUS

## 2017-12-28 MED ORDER — FUROSEMIDE 40 MG PO TABS
40.0000 mg | ORAL_TABLET | Freq: Every day | ORAL | Status: DC
  Administered 2017-12-28 – 2017-12-29 (×2): 40 mg via ORAL
  Filled 2017-12-28 (×2): qty 1

## 2017-12-28 MED ORDER — DEXAMETHASONE SODIUM PHOSPHATE 10 MG/ML IJ SOLN
INTRAMUSCULAR | Status: DC | PRN
  Administered 2017-12-28: 10 mg via INTRAVENOUS

## 2017-12-28 MED ORDER — SODIUM CHLORIDE 0.9 % IV SOLN
INTRAVENOUS | Status: DC | PRN
  Administered 2017-12-28: 07:00:00

## 2017-12-28 MED ORDER — 0.9 % SODIUM CHLORIDE (POUR BTL) OPTIME
TOPICAL | Status: DC | PRN
  Administered 2017-12-28: 2000 mL

## 2017-12-28 MED ORDER — PROTAMINE SULFATE 10 MG/ML IV SOLN
INTRAVENOUS | Status: DC | PRN
  Administered 2017-12-28 (×2): 5 mg via INTRAVENOUS
  Administered 2017-12-28: 4 mg via INTRAVENOUS
  Administered 2017-12-28 (×3): 5 mg via INTRAVENOUS
  Administered 2017-12-28: 2 mg via INTRAVENOUS
  Administered 2017-12-28: 4 mg via INTRAVENOUS
  Administered 2017-12-28 (×2): 5 mg via INTRAVENOUS

## 2017-12-28 MED ORDER — METOPROLOL TARTRATE 5 MG/5ML IV SOLN: 2.0000 mg | INTRAVENOUS | Status: DC | PRN

## 2017-12-28 MED ORDER — SUCCINYLCHOLINE CHLORIDE 200 MG/10ML IV SOSY
PREFILLED_SYRINGE | INTRAVENOUS | Status: AC
  Filled 2017-12-28: qty 10

## 2017-12-28 MED ORDER — LIDOCAINE HCL (PF) 1 % IJ SOLN
INTRAMUSCULAR | Status: AC
  Filled 2017-12-28: qty 30

## 2017-12-28 MED ORDER — DEXTROSE 5 % IV SOLN
INTRAVENOUS | Status: AC
  Filled 2017-12-28: qty 1.5

## 2017-12-28 MED ORDER — HYDRALAZINE HCL 20 MG/ML IJ SOLN: 5.0000 mg | INTRAMUSCULAR | Status: DC | PRN

## 2017-12-28 MED ORDER — PANTOPRAZOLE SODIUM 40 MG PO TBEC
40.0000 mg | DELAYED_RELEASE_TABLET | Freq: Every day | ORAL | Status: DC
  Administered 2017-12-28 – 2017-12-29 (×2): 40 mg via ORAL
  Filled 2017-12-28 (×2): qty 1

## 2017-12-28 MED ORDER — PHENYLEPHRINE 40 MCG/ML (10ML) SYRINGE FOR IV PUSH (FOR BLOOD PRESSURE SUPPORT)
PREFILLED_SYRINGE | INTRAVENOUS | Status: AC
  Filled 2017-12-28: qty 30

## 2017-12-28 MED ORDER — ONDANSETRON HCL 4 MG/2ML IJ SOLN: 4.0000 mg | Freq: Four times a day (QID) | INTRAMUSCULAR | Status: DC | PRN

## 2017-12-28 MED ORDER — PHENOL 1.4 % MT LIQD
1.0000 | OROMUCOSAL | Status: DC | PRN
Start: 2017-12-28 — End: 2017-12-29

## 2017-12-28 MED ORDER — ATORVASTATIN CALCIUM 80 MG PO TABS
80.0000 mg | ORAL_TABLET | Freq: Every evening | ORAL | Status: DC
  Administered 2017-12-28: 80 mg via ORAL
  Filled 2017-12-28: qty 1

## 2017-12-28 MED ORDER — ROCURONIUM BROMIDE 10 MG/ML (PF) SYRINGE
PREFILLED_SYRINGE | INTRAVENOUS | Status: AC
  Filled 2017-12-28: qty 5

## 2017-12-28 MED ORDER — FENTANYL CITRATE (PF) 100 MCG/2ML IJ SOLN
INTRAMUSCULAR | Status: DC | PRN
  Administered 2017-12-28: 100 ug via INTRAVENOUS
  Administered 2017-12-28: 50 ug via INTRAVENOUS

## 2017-12-28 MED ORDER — DEXAMETHASONE SODIUM PHOSPHATE 10 MG/ML IJ SOLN
INTRAMUSCULAR | Status: AC
  Filled 2017-12-28: qty 1

## 2017-12-28 MED ORDER — HEPARIN SODIUM (PORCINE) 1000 UNIT/ML IJ SOLN
INTRAMUSCULAR | Status: DC | PRN
  Administered 2017-12-28: 7000 [IU] via INTRAVENOUS

## 2017-12-28 MED ORDER — ACETAMINOPHEN 325 MG PO TABS
325.0000 mg | ORAL_TABLET | ORAL | Status: DC | PRN
  Administered 2017-12-28: 650 mg via ORAL
  Filled 2017-12-28: qty 2

## 2017-12-28 MED ORDER — SODIUM CHLORIDE 0.9 % IV SOLN: INTRAVENOUS | Status: DC

## 2017-12-28 MED ORDER — ROCURONIUM BROMIDE 100 MG/10ML IV SOLN
INTRAVENOUS | Status: DC | PRN
  Administered 2017-12-28: 10 mg via INTRAVENOUS
  Administered 2017-12-28: 60 mg via INTRAVENOUS

## 2017-12-28 MED ORDER — SPIRONOLACTONE 25 MG PO TABS
25.0000 mg | ORAL_TABLET | Freq: Every day | ORAL | Status: DC
  Administered 2017-12-28 – 2017-12-29 (×2): 25 mg via ORAL
  Filled 2017-12-28 (×2): qty 1

## 2017-12-28 MED ORDER — ASPIRIN EC 81 MG PO TBEC
81.0000 mg | DELAYED_RELEASE_TABLET | Freq: Every day | ORAL | Status: DC
  Administered 2017-12-29: 81 mg via ORAL
  Filled 2017-12-28: qty 1

## 2017-12-28 MED ORDER — DEXTROSE 5 % IV SOLN
1.5000 g | INTRAVENOUS | Status: AC
  Administered 2017-12-28: 1.5 g via INTRAVENOUS

## 2017-12-28 MED ORDER — ENOXAPARIN SODIUM 40 MG/0.4ML ~~LOC~~ SOLN: 40.0000 mg | SUBCUTANEOUS | Status: DC

## 2017-12-28 MED ORDER — POTASSIUM CHLORIDE CRYS ER 20 MEQ PO TBCR: 20.0000 meq | EXTENDED_RELEASE_TABLET | Freq: Every day | ORAL | Status: DC | PRN

## 2017-12-28 MED ORDER — HEPARIN SODIUM (PORCINE) 1000 UNIT/ML IJ SOLN
INTRAMUSCULAR | Status: AC
  Filled 2017-12-28: qty 1

## 2017-12-28 MED ORDER — PHENYLEPHRINE HCL 10 MG/ML IJ SOLN
INTRAMUSCULAR | Status: DC | PRN
  Administered 2017-12-28: 80 ug via INTRAVENOUS
  Administered 2017-12-28: 120 ug via INTRAVENOUS
  Administered 2017-12-28: 80 ug via INTRAVENOUS

## 2017-12-28 MED ORDER — PHENYLEPHRINE 40 MCG/ML (10ML) SYRINGE FOR IV PUSH (FOR BLOOD PRESSURE SUPPORT)
PREFILLED_SYRINGE | INTRAVENOUS | Status: AC
  Filled 2017-12-28: qty 10

## 2017-12-28 MED ORDER — HYDROMORPHONE HCL 1 MG/ML IJ SOLN
0.2500 mg | INTRAMUSCULAR | Status: DC | PRN
  Administered 2017-12-28: 0.5 mg via INTRAVENOUS

## 2017-12-28 MED ORDER — OXYCODONE HCL 5 MG PO TABS
5.0000 mg | ORAL_TABLET | ORAL | Status: DC | PRN
  Administered 2017-12-28: 10 mg via ORAL
  Administered 2017-12-29: 5 mg via ORAL
  Filled 2017-12-28: qty 1
  Filled 2017-12-28: qty 2

## 2017-12-28 MED ORDER — SUGAMMADEX SODIUM 200 MG/2ML IV SOLN
INTRAVENOUS | Status: DC | PRN
  Administered 2017-12-28: 140 mg via INTRAVENOUS

## 2017-12-28 MED ORDER — MAGNESIUM SULFATE 2 GM/50ML IV SOLN: 2.0000 g | Freq: Every day | INTRAVENOUS | Status: DC | PRN

## 2017-12-28 MED ORDER — LABETALOL HCL 5 MG/ML IV SOLN: 10.0000 mg | INTRAVENOUS | Status: DC | PRN

## 2017-12-28 MED ORDER — ACETAMINOPHEN 650 MG RE SUPP: 325.0000 mg | RECTAL | Status: DC | PRN

## 2017-12-28 MED ORDER — LISINOPRIL 10 MG PO TABS
10.0000 mg | ORAL_TABLET | Freq: Every day | ORAL | Status: DC
  Administered 2017-12-28: 10 mg via ORAL
  Filled 2017-12-28: qty 1

## 2017-12-28 MED ORDER — EPHEDRINE SULFATE 50 MG/ML IJ SOLN
INTRAMUSCULAR | Status: DC | PRN
  Administered 2017-12-28 (×3): 5 mg via INTRAVENOUS

## 2017-12-28 MED ORDER — ONDANSETRON HCL 4 MG/2ML IJ SOLN
INTRAMUSCULAR | Status: AC
  Filled 2017-12-28: qty 2

## 2017-12-28 MED ORDER — GUAIFENESIN-DM 100-10 MG/5ML PO SYRP: 15.0000 mL | ORAL_SOLUTION | ORAL | Status: DC | PRN

## 2017-12-28 SURGICAL SUPPLY — 44 items
CANISTER SUCT 3000ML PPV (MISCELLANEOUS) ×3 IMPLANT
CANNULA VESSEL 3MM 2 BLNT TIP (CANNULA) ×6 IMPLANT
CATH ROBINSON RED A/P 18FR (CATHETERS) ×3 IMPLANT
CLIP LIGATING EXTRA MED SLVR (CLIP) ×3 IMPLANT
CLIP LIGATING EXTRA SM BLUE (MISCELLANEOUS) ×3 IMPLANT
CRADLE DONUT ADULT HEAD (MISCELLANEOUS) ×3 IMPLANT
DECANTER SPIKE VIAL GLASS SM (MISCELLANEOUS) IMPLANT
DERMABOND ADVANCED (GAUZE/BANDAGES/DRESSINGS) ×2
DERMABOND ADVANCED .7 DNX12 (GAUZE/BANDAGES/DRESSINGS) ×1 IMPLANT
DRAIN HEMOVAC 1/8 X 5 (WOUND CARE) ×3 IMPLANT
ELECT REM PT RETURN 9FT ADLT (ELECTROSURGICAL) ×3
ELECTRODE REM PT RTRN 9FT ADLT (ELECTROSURGICAL) ×1 IMPLANT
EVACUATOR SILICONE 100CC (DRAIN) IMPLANT
GLOVE BIO SURGEON STRL SZ7.5 (GLOVE) ×3 IMPLANT
GLOVE BIOGEL PI IND STRL 6.5 (GLOVE) ×1 IMPLANT
GLOVE BIOGEL PI IND STRL 8 (GLOVE) ×1 IMPLANT
GLOVE BIOGEL PI INDICATOR 6.5 (GLOVE) ×2
GLOVE BIOGEL PI INDICATOR 8 (GLOVE) ×2
GLOVE SS BIOGEL STRL SZ 7.5 (GLOVE) ×1 IMPLANT
GLOVE SUPERSENSE BIOGEL SZ 7.5 (GLOVE) ×2
GLOVE SURG SS PI 7.0 STRL IVOR (GLOVE) ×3 IMPLANT
GOWN STRL REUS W/ TWL LRG LVL3 (GOWN DISPOSABLE) ×5 IMPLANT
GOWN STRL REUS W/TWL LRG LVL3 (GOWN DISPOSABLE) ×10
GOWN STRL REUS W/TWL XL LVL3 (GOWN DISPOSABLE) ×3 IMPLANT
KIT BASIN OR (CUSTOM PROCEDURE TRAY) ×3 IMPLANT
KIT ROOM TURNOVER OR (KITS) ×3 IMPLANT
KIT SHUNT ARGYLE CAROTID ART 6 (VASCULAR PRODUCTS) IMPLANT
NEEDLE 22X1 1/2 (OR ONLY) (NEEDLE) IMPLANT
NS IRRIG 1000ML POUR BTL (IV SOLUTION) ×6 IMPLANT
PACK CAROTID (CUSTOM PROCEDURE TRAY) ×3 IMPLANT
PAD ARMBOARD 7.5X6 YLW CONV (MISCELLANEOUS) ×6 IMPLANT
PATCH HEMASHIELD 8X75 (Vascular Products) ×3 IMPLANT
SHUNT CAROTID BYPASS 10 (VASCULAR PRODUCTS) IMPLANT
SHUNT CAROTID BYPASS 12FRX15.5 (VASCULAR PRODUCTS) IMPLANT
SUT ETHILON 3 0 PS 1 (SUTURE) IMPLANT
SUT PROLENE 6 0 CC (SUTURE) ×6 IMPLANT
SUT SILK 3 0 (SUTURE)
SUT SILK 3-0 18XBRD TIE 12 (SUTURE) IMPLANT
SUT VIC AB 3-0 SH 27 (SUTURE) ×4
SUT VIC AB 3-0 SH 27X BRD (SUTURE) ×2 IMPLANT
SUT VICRYL 4-0 PS2 18IN ABS (SUTURE) ×3 IMPLANT
SYR CONTROL 10ML LL (SYRINGE) IMPLANT
TOWEL GREEN STERILE (TOWEL DISPOSABLE) ×3 IMPLANT
WATER STERILE IRR 1000ML POUR (IV SOLUTION) ×3 IMPLANT

## 2017-12-28 NOTE — Discharge Instructions (Signed)
   [facility] [place]  Discharge Instructions   Carotid Endarterectomy (CEA)  Please refer to the following instructions for your post-procedure care. Your surgeon or physician assistant will discuss any changes with you.  Activity  You are encouraged to walk as much as you can. You can slowly return to normal activities but must avoid strenuous activity and heavy lifting until your doctor tell you it's OK. Avoid activities such as vacuuming or swinging a golf club. You can drive after one week if you are comfortable and you are no longer taking prescription pain medications. It is normal to feel tired for serval weeks after your surgery. It is also normal to have difficulty with sleep habits, eating, and bowel movements after surgery. These will go away with time.  Bathing/Showering  You may shower after you come home. Do not soak in a bathtub, hot tub, or swim until the incision heals completely.  Incision Care  Shower every day. Clean your incision with mild soap and water. Pat the area dry with a clean towel. You do not need a bandage unless otherwise instructed. Do not apply any ointments or creams to your incision. You may have skin glue on your incision. Do not peel it off. It will come off on its own in about one week. Your incision may feel thickened and raised for several weeks after your surgery. This is normal and the skin will soften over time. For Men Only: It's OK to shave around the incision but do not shave the incision itself for 2 weeks. It is common to have numbness under your chin that could last for several months.  Diet  Resume your normal diet. There are no special food restrictions following this procedure. A low fat/low cholesterol diet is recommended for all patients with vascular disease. In order to heal from your surgery, it is CRITICAL to get adequate nutrition. Your body requires vitamins, minerals, and protein. Vegetables are the best  source of vitamins and minerals. Vegetables also provide the perfect balance of protein. Processed food has little nutritional value, so try to avoid this.        Medications  Resume taking all of your medications unless your doctor or physician assistant tells you not to. If your incision is causing pain, you may take over-the- counter pain relievers such as acetaminophen (Tylenol). If you were prescribed a stronger pain medication, please be aware these medications can cause nausea and constipation. Prevent nausea by taking the medication with a snack or meal. Avoid constipation by drinking plenty of fluids and eating foods with a high amount of fiber, such as fruits, vegetables, and grains. Do not take Tylenol if you are taking prescription pain medications.  Follow Up  Our office will schedule a follow up appointment 2-3 weeks following discharge.  Please call us immediately for any of the following conditions  Increased pain, redness, drainage (pus) from your incision site. Fever of 101 degrees or higher. If you should develop stroke (slurred speech, difficulty swallowing, weakness on one side of your body, loss of vision) you should call 911 and go to the nearest emergency room.  Reduce your risk of vascular disease:  Stop smoking. If you would like help call [facility] at 1-800-QUIT-NOW (1-800-784-8669) or [place] at 336-586-4000. Manage your cholesterol Maintain a desired weight Control your diabetes Keep your blood pressure down  If you have any questions, please call the office at [phone].   

## 2017-12-28 NOTE — Op Note (Signed)
    OPERATIVE REPORT  DATE OF SURGERY: 12/28/2017  PATIENT: Lance Ayala, 62 y.o. male MRN: [id number]  DOB: December 16, 1955  PRE-OPERATIVE DIAGNOSIS: Severe asymptomatic right internal carotid artery stenosis  POST-OPERATIVE DIAGNOSIS:  Same  PROCEDURE: Right carotid endarterectomy and Dacron patch angioplasty  SURGEON:  Gretta Began, M.D.  PHYSICIAN ASSISTANT: Dr. Woodfin Ganja, Lianne Cure PA-C  ANESTHESIA: General  EBL: 25 ml  Total I/O In: 864.8 [I.V.:814.8; IV Piggyback:50] Out: 25 [Blood:25]  BLOOD ADMINISTERED: None  DRAINS: None  SPECIMEN: None  COUNTS CORRECT:  YES  PLAN OF CARE: PACU  PATIENT DISPOSITION:  PACU - hemodynamically stable  PROCEDURE DETAILS: The patient was taken to the operating placed in supine position where the area of the right next prepped and draped you sterile fashion.  Incision was made anterior cervical sternocleidomastoid and carried down through the platysma with electrocautery.  Sternocleidomastoid reflected posteriorly and the carotid sheath was opened.  The facial vein was ligated with 2-0 silk ties and divided.  The vagus and hypoglossal nerves were identified and preserved.  Dissection was continued onto the bifurcation.  The superior thyroid artery was encircled with a 2-0 silk Potts tie.  The external carotid was encircled with a blue vessel loop and the internal and common carotid arteries were encircled with umbilical tape and Rummel tourniquet.  The patient was given 7000 units of intravenous heparin.  After adequate circulation time the internal/external and common carotid artery was opened with an 11 blade and extended longitudinally with Potts scissors onto the internal carotid artery with Potts scissors.  The patient did have a critical stenosis in the internal carotid artery with a very irregular plaque.  A 10 shunt was passed up the internal carotid and allowed to backbleed and then down the common carotid where it was secured with  a Rummel tourniquet.  The endarterectomy was again on the common carotid artery and the plaque was divided proximally with Potts scissors.  The endarterectomy was continued onto the bifurcation.  The external carotid was endarterectomized with an eversion technique in the internal carotid in an open fashion.  Remaining atheromatous debris was removed from the carotid.  A Finesse Hemashield Dacron patch was brought into the field and was sewn as a patch angioplasty with a running 6-0 Prolene suture.  Prior to completion of the closure the shunt was removed in the usual flushing maneuvers of the anastomosis was then completed and flow was restored first to the external then the internal carotid artery.  Excellent flow characteristics were noted with hand-held Doppler in the internal and external carotid artery.  The patient was given 50 mg of protamine to reverse the heparin.  The wounds irrigated with saline.  Hemostasis obtained with cautery.  Wounds were closed with several 3-0 Vicryl sutures to reapproximate the sternocleidomastoid over the carotid sheath.  The platysma was then run with a 3-0 Vicryl suture and the skin was closed with a 4-0 subcuticular Vicryl stitch.  The patient was awake and neurologically intact in the operating room and was transferred to the recovery room in stable condition   [name], M.D., [facility] 12/28/2017 11:34 AM

## 2017-12-28 NOTE — Progress Notes (Signed)
[facility] [place]  Subjective  - Doing well.   Objective (!) 109/43 (!) 58 (!) 97.5 F (36.4 C) 10 95%  Intake/Output Summary (Last 24 hours) at 12/28/2017 1151 Last data filed at 12/28/2017 0942 Gross per 24 hour  Intake 864.75 ml  Output 25 ml  Net 839.75 ml    No tongue deviation and smile is symmetric without mandibular nerve neuropraxia.   Right palpable radial and grip 5/5 grossly Right neck incision soft wihtout hematoma  Assessment/Planning: PACU visit post op right CEA  Disposition stable neurovascularly intact. Pending 4E room.  Lance Ayala 12/28/2017 11:51 AM --  Laboratory Lab Results: Recent Labs    12/28/17 1100  WBC 11.1*  HGB 11.2*  HCT 33.1*  PLT 171   BMET No results for input(s): NA, K, CL, CO2, GLUCOSE, BUN, CREATININE, CALCIUM in the last 72 hours.  COAG Lab Results  Component Value Date   INR 0.94 12/25/2017   INR 1.0 10/27/2017   No results found for: PTT

## 2017-12-28 NOTE — Transfer of Care (Signed)
Immediate Anesthesia Transfer of Care Note  Patient: Lance Ayala  Procedure(s) Performed: RIGHT CAROTID ENDARTERECTOMY (Right Neck) WITH  HEMASHIELD  0.3 X 3 IN  USE AS PATCH ANGIOPLASTY (Right Neck)  Patient Location: PACU  Anesthesia Type:General  Level of Consciousness: awake, oriented and patient cooperative  Airway & Oxygen Therapy: Patient Spontanous Breathing and Patient connected to face mask oxygen  Post-op Assessment: Report given to RN, Post -op Vital signs reviewed and stable and Patient moving all extremities  Post vital signs: Reviewed and stable  Last Vitals:  Vitals:   12/28/17 0542 12/28/17 0938  BP: (!) 115/49 (!) 102/44  Pulse: 65 78  Resp: 20 18  Temp: 36.5 C (!) 36.2 C  SpO2: 99% 98%    Last Pain:  Vitals:   12/28/17 0542  TempSrc: Oral      Patients Stated Pain Goal: 0 (12/28/17 0553)  Complications: No apparent anesthesia complications

## 2017-12-28 NOTE — Telephone Encounter (Signed)
-----   Message from Sharee Pimple, RN sent at 12/28/2017  9:48 AM EST ----- Regarding: 2-3 weeks post CEA   ----- Message ----- From: Lars Mage, PA-C Sent: 12/28/2017   8:58 AM To: Vvs Charge Pool  F/U with Dr. Arbie Cookey in 2-3 weeks s/p right CEA

## 2017-12-28 NOTE — Interval H&P Note (Signed)
History and Physical Interval Note:  12/28/2017 7:14 AM  Lance Ayala  has presented today for surgery, with the diagnosis of RIGHT CAROTID STENOSIS  The various methods of treatment have been discussed with the patient and family. After consideration of risks, benefits and other options for treatment, the patient has consented to  Procedure(s): ENDARTERECTOMY CAROTID RIGHT (Right) as a surgical intervention .  The patient's history has been reviewed, patient examined, no change in status, stable for surgery.  I have reviewed the patient's chart and labs.  Questions were answered to the patient's satisfaction.     Gretta Began

## 2017-12-28 NOTE — Anesthesia Preprocedure Evaluation (Signed)
Anesthesia Evaluation  Patient identified by MRN, date of birth, ID band Patient awake    Reviewed: Allergy & Precautions, NPO status , Patient's Chart, lab work & pertinent test results  Airway Mallampati: II  TM Distance: >3 FB Neck ROM: Full    Dental no notable dental hx.    Pulmonary COPD, former smoker,    Pulmonary exam normal breath sounds clear to auscultation + decreased breath sounds      Cardiovascular hypertension, + CAD, + Past MI and +CHF  Normal cardiovascular exam Rhythm:Regular Rate:Normal  Cath:  There is severe left ventricular systolic dysfunction. LV end diastolic pressure is normal. The left ventricular ejection fraction is 25-35% by visual estimate. There are LV function abnormalities due to segmental dysfunction  ECHO: LVEF 35-40%, normal wall thickness, global hypokinesis, no mural   thrombus, abnormal GLS at -12%, grade 1 DD, elevated LV filling   pressure, aortic valve sclerosis, trivial MR, normal LA size,   normal IVC.   Neuro/Psych CVA negative psych ROS   GI/Hepatic negative GI ROS, Neg liver ROS,   Endo/Other  negative endocrine ROS  Renal/GU negative Renal ROS  negative genitourinary   Musculoskeletal negative musculoskeletal ROS (+)   Abdominal   Peds negative pediatric ROS (+)  Hematology negative hematology ROS (+)   Anesthesia Other Findings   Reproductive/Obstetrics negative OB ROS                             Anesthesia Physical Anesthesia Plan  ASA: III  Anesthesia Plan: General   Post-op Pain Management:    Induction: Intravenous  PONV Risk Score and Plan: 2 and Ondansetron and Dexamethasone  Airway Management Planned: Oral ETT  Additional Equipment: Arterial line  Intra-op Plan:   Post-operative Plan: Extubation in OR  Informed Consent: I have reviewed the patients History and Physical, chart, labs and discussed the procedure  including the risks, benefits and alternatives for the proposed anesthesia with the patient or authorized representative who has indicated his/her understanding and acceptance.   Dental advisory given  Plan Discussed with: CRNA and Surgeon  Anesthesia Plan Comments:         Anesthesia Quick Evaluation

## 2017-12-28 NOTE — Anesthesia Procedure Notes (Signed)
Arterial Line Insertion Start/End1/28/2019 7:00 AM, 12/28/2017 7:10 AM Performed by: Shireen Quan, CRNA, CRNA  Patient location: Pre-op. Preanesthetic checklist: patient identified, IV checked, site marked, risks and benefits discussed, surgical consent, monitors and equipment checked, pre-op evaluation, timeout performed and anesthesia consent Lidocaine 1% used for infiltration Left, radial was placed Catheter size: 20 G Hand hygiene performed , maximum sterile barriers used  and Seldinger technique used  Attempts: 1 Procedure performed without using ultrasound guided technique. Following insertion, dressing applied and Biopatch. Post procedure assessment: normal  Patient tolerated the procedure well with no immediate complications. Additional procedure comments: T Reed SRNA place Arterial line under supervision of D Corporate treasurer.

## 2017-12-28 NOTE — Anesthesia Procedure Notes (Addendum)
Procedure Name: Intubation Date/Time: 12/28/2017 7:38 AM Performed by: Moshe Salisbury, CRNA Pre-anesthesia Checklist: Patient identified, Emergency Drugs available, Suction available and Patient being monitored Patient Re-evaluated:Patient Re-evaluated prior to induction Oxygen Delivery Method: Circle system utilized Preoxygenation: Pre-oxygenation with 100% oxygen Induction Type: IV induction Ventilation: Mask ventilation without difficulty Laryngoscope Size: Mac and 4 Grade View: Grade I Tube type: Oral Tube size: 7.5 mm Number of attempts: 1 Airway Equipment and Method: Stylet Placement Confirmation: ETT inserted through vocal cords under direct vision,  positive ETCO2,  breath sounds checked- equal and bilateral and CO2 detector Secured at: 21 (lip) cm Tube secured with: Tape Dental Injury: Teeth and Oropharynx as per pre-operative assessment  Comments: DVL and intubation performed by Sherie Don, SRNA

## 2017-12-28 NOTE — Telephone Encounter (Signed)
Sched appt 01/19/18 at 3:15. Lm on hm# to inform pt of appt.

## 2017-12-28 NOTE — Anesthesia Postprocedure Evaluation (Signed)
Anesthesia Post Note  Patient: Lance Ayala  Procedure(s) Performed: RIGHT CAROTID ENDARTERECTOMY (Right Neck) WITH  HEMASHIELD  0.3 X 3 IN  USE AS PATCH ANGIOPLASTY (Right Neck)     Patient location during evaluation: PACU Anesthesia Type: General Level of consciousness: awake and alert Pain management: pain level controlled Vital Signs Assessment: post-procedure vital signs reviewed and stable Respiratory status: spontaneous breathing, nonlabored ventilation, respiratory function stable and patient connected to nasal cannula oxygen Cardiovascular status: blood pressure returned to baseline and stable Postop Assessment: no apparent nausea or vomiting Anesthetic complications: no    Last Vitals:  Vitals:   12/28/17 0953 12/28/17 1007  BP: (!) 93/50 (!) 112/44  Pulse: 71 65  Resp: 11 13  Temp:    SpO2: 98% 95%    Last Pain:  Vitals:   12/28/17 0542  TempSrc: Oral    LLE Motor Response: Purposeful movement;Responds to commands (12/28/17 1007) LLE Sensation: Full sensation (12/28/17 1007) RLE Motor Response: Purposeful movement;Responds to commands (12/28/17 1007) RLE Sensation: Full sensation (12/28/17 1007)      Aston Lieske S

## 2017-12-29 ENCOUNTER — Encounter (HOSPITAL_COMMUNITY): Payer: Self-pay | Admitting: Vascular Surgery

## 2017-12-29 LAB — BASIC METABOLIC PANEL
Anion gap: 10 (ref 5–15)
BUN: 19 mg/dL (ref 6–20)
CALCIUM: 8.5 mg/dL — AB (ref 8.9–10.3)
CO2: 21 mmol/L — ABNORMAL LOW (ref 22–32)
CREATININE: 1.13 mg/dL (ref 0.61–1.24)
Chloride: 106 mmol/L (ref 101–111)
Glucose, Bld: 129 mg/dL — ABNORMAL HIGH (ref 65–99)
Potassium: 4.3 mmol/L (ref 3.5–5.1)
SODIUM: 137 mmol/L (ref 135–145)

## 2017-12-29 LAB — CBC
HCT: 36.2 % — ABNORMAL LOW (ref 39.0–52.0)
Hemoglobin: 12.1 g/dL — ABNORMAL LOW (ref 13.0–17.0)
MCH: 31.7 pg (ref 26.0–34.0)
MCHC: 33.4 g/dL (ref 30.0–36.0)
MCV: 94.8 fL (ref 78.0–100.0)
PLATELETS: 182 10*3/uL (ref 150–400)
RBC: 3.82 MIL/uL — ABNORMAL LOW (ref 4.22–5.81)
RDW: 12.9 % (ref 11.5–15.5)
WBC: 13.3 10*3/uL — ABNORMAL HIGH (ref 4.0–10.5)

## 2017-12-29 MED ORDER — OXYCODONE HCL 5 MG PO TABS: 5.0000 mg | ORAL_TABLET | Freq: Four times a day (QID) | ORAL | 0 refills | Status: DC | PRN

## 2017-12-29 NOTE — Progress Notes (Addendum)
[facility]  Subjective  - Doing well, ambulated, tolerating PO's and voided.   Objective (!) 123/50 62 98.4 F (36.9 C) (Oral) 18 98%  Intake/Output Summary (Last 24 hours) at 12/29/2017 0707 Last data filed at 12/29/2017 0423 Gross per 24 hour  Intake 1274.75 ml  Output 905 ml  Net 369.75 ml    Right neck incision healing well without hematoma No tongue deviation and smile  Grip 5/5 B, sensation intact Heart RRR Lungs non labored breathing Gen NAD  Assessment/Planning: POD # 1 right CEA  Doing well, no complaints other than a little sore around the incision. Plan discharge home today f/u in 2-3 weeks with Dr. Arbie Cookey Left ICA 60-79%.  We will continue close observation on B carotids on a regular basis.  Right ICA stenosis found during cardiac work up with remote history of left sided weakness 20 years ago and no other episodes.    Mosetta Pigeon 12/29/2017 7:07 AM --  Laboratory Lab Results: Recent Labs    12/28/17 1100 12/29/17 0236  WBC 11.1* 13.3*  HGB 11.2* 12.1*  HCT 33.1* 36.2*  PLT 171 182   BMET Recent Labs    12/28/17 1100 12/29/17 0236  NA  --  137  K  --  4.3  CL  --  106  CO2  --  21*  GLUCOSE  --  129*  BUN  --  19  CREATININE 1.11 1.13  CALCIUM  --  8.5*    COAG Lab Results  Component Value Date   INR 0.94 12/25/2017   INR 1.0 10/27/2017   No results found for: PTT

## 2017-12-29 NOTE — Care Management Note (Signed)
Case Management Note Donn Pierini RN, BSN Unit 4E-Case Manager [phone]  Patient Details  Name: Lance Ayala MRN: [id number] Date of Birth: 1956/07/07  Subjective/Objective:  Pt admitted s/p CEA                  Action/Plan: PTA pt lived at home alone- independent- plan to return home, son to be with pt on discharge to assist- no CM needs noted for transition to home  Expected Discharge Date:  12/29/17               Expected Discharge Plan:  Home/Self Care  In-House Referral:  NA  Discharge planning Services  NA  Post Acute Care Choice:  NA Choice offered to:     DME Arranged:    DME Agency:     HH Arranged:    HH Agency:     Status of Service:  Completed, signed off  If discussed at Long Length of Stay Meetings, dates discussed:    Discharge Disposition: home/self care   Additional Comments:  Darrold Span, RN 12/29/2017, 10:45 AM

## 2017-12-29 NOTE — Progress Notes (Signed)
Pt discharged per MD order. Discharge instructions reviewed with pt and son. Verbalized understanding and has no questions at this time. Upcoming appts discussed. Oxycodone script given to son. IV and tele removed. Pt discharged to personal home accompanied by son.

## 2017-12-30 NOTE — Discharge Summary (Signed)
[facility] Discharge Summary   Patient ID:  Lance Ayala MRN: [id number] DOB/AGE: [birth year] 62 y.o.  Admit date: 12/28/2017 Discharge date: 12/29/2017 Date of Surgery: 12/28/2017 Surgeon: Surgeon(s): Early, Kristen Loader, MD Chuck Hint, MD  Admission Diagnosis: RIGHT CAROTID STENOSIS  Discharge Diagnoses:  RIGHT CAROTID STENOSIS  Secondary Diagnoses: Past Medical History:  Diagnosis Date  . CAD (coronary artery disease) 10/21/2017  . Chest pain 10/21/2017  . Chest tightness 10/21/2017  . Cholelithiasis   . Congestive heart failure (CHF) (HCC) 10/21/2017  . COPD (chronic obstructive pulmonary disease) (HCC) 10/21/2017  . Hypertension 10/21/2017  . Lung nodule   . Myocardial infarction (HCC) 10/21/2017  . Stroke [name]    " Mini stroke in 2000"  . Wears dentures     Procedure(s): RIGHT CAROTID ENDARTERECTOMY WITH  HEMASHIELD  0.3 X 3 IN  USE AS PATCH ANGIOPLASTY  Discharged Condition: good  HPI:  62 y/o male who under went preoperative evaluation for up coming coronary artery bypass grafting with Dr. Donata Clay revealed critical stenosis of his right internal carotid artery and moderate stenosis of his left carotid artery.   Dr. Arbie Cookey and Dr. Alexander Mt decided the most appropriate course was to proceed with right CEA prior to his coronary bypass surgery.    Hospital Course:  Lance Ayala is a 62 y.o. male is S/P Procedure(s): RIGHT CAROTID ENDARTERECTOMY WITH  HEMASHIELD  0.3 X 3 IN  USE AS PATCH ANGIOPLASTY POD # 1 right CEA  Doing well, no complaints other than a little sore around the incision. Plan discharge home today f/u in 2-3 weeks with Dr. Arbie Cookey Left ICA 60-79%.  We will continue close observation on B carotids on a regular basis.  Right ICA stenosis found during cardiac work up with remote history of left sided weakness 20 years ago and no other episodes.      Significant Diagnostic Studies: CBC Lab Results  Component Value  Date   WBC 13.3 (H) 12/29/2017   HGB 12.1 (L) 12/29/2017   HCT 36.2 (L) 12/29/2017   MCV 94.8 12/29/2017   PLT 182 12/29/2017    BMET    Component Value Date/Time   NA 137 12/29/2017 0236   NA 142 11/13/2017 1649   K 4.3 12/29/2017 0236   CL 106 12/29/2017 0236   CO2 21 (L) 12/29/2017 0236   GLUCOSE 129 (H) 12/29/2017 0236   BUN 19 12/29/2017 0236   BUN 24 11/13/2017 1649   CREATININE 1.13 12/29/2017 0236   CALCIUM 8.5 (L) 12/29/2017 0236   GFRNONAA >60 12/29/2017 0236   GFRAA >60 12/29/2017 0236   COAG Lab Results  Component Value Date   INR 0.94 12/25/2017   INR 1.0 10/27/2017     Disposition:  Discharge to :Home Discharge Instructions    Call MD for:  redness, tenderness, or signs of infection (pain, swelling, bleeding, redness, odor or green/yellow discharge around incision site)   Complete by:  As directed    Call MD for:  severe or increased pain, loss or decreased feeling  in affected limb(s)   Complete by:  As directed    Call MD for:  temperature >100.5   Complete by:  As directed    Discharge patient   Complete by:  As directed    Discharge disposition:  01-Home or Self Care   Discharge patient date:  12/29/2017   Resume previous diet   Complete by:  As directed      Allergies as of  12/29/2017   No Known Allergies     Medication List    TAKE these medications   acetaminophen 325 MG tablet Commonly known as:  TYLENOL Take 325-650 mg by mouth every 6 (six) hours as needed for moderate pain or headache.   aspirin EC 81 MG tablet Take 81 mg by mouth daily.   atorvastatin 80 MG tablet Commonly known as:  LIPITOR Take 1 tablet (80 mg total) by mouth every evening.   carvedilol 3.125 MG tablet Commonly known as:  COREG Take 1 tablet (3.125 mg total) by mouth 2 (two) times daily with a meal.   furosemide 40 MG tablet Commonly known as:  LASIX Take 1 tablet (40 mg total) by mouth daily.   lisinopril 10 MG tablet Commonly known as:   PRINIVIL,ZESTRIL Take 1 tablet (10 mg total) by mouth at bedtime.   nitroGLYCERIN 0.4 MG SL tablet Commonly known as:  NITROSTAT Place 0.4 mg under the tongue every 5 (five) minutes as needed for chest pain.   oxyCODONE 5 MG immediate release tablet Commonly known as:  Oxy IR/ROXICODONE Take 1 tablet (5 mg total) by mouth every 6 (six) hours as needed for moderate pain.   spironolactone 25 MG tablet Commonly known as:  ALDACTONE Take 1 tablet (25 mg total) by mouth daily.      Verbal and written Discharge instructions given to the patient. Wound care per Discharge AVS Follow-up Information    Kerin Perna, MD On 01/13/2018.   Specialty:  Cardiothoracic Surgery Why:  Office will call you to arrange your appt (sent) aapt. at 10:30 am Contact information: [address] [place] [address] [phone]        [name], MD In 2 weeks.   Specialties:  Vascular Surgery, Cardiology Why:  Office will call you to arrange your appt (sent) Contact information: [address] [place] [address] [phone]           Signed: Mosetta Pigeon 12/30/2017, 10:14 AM --- For VQI Registry use --- Instructions: Press F2 to tab through selections.  Delete question if not applicable.   Modified Rankin score at D/C (0-6): Rankin Score=0  IV medication needed for:  1. Hypertension: No 2. Hypotension: No  Post-op Complications: No  1. Post-op CVA or TIA: No  If yes: Event classification (right eye, left eye, right cortical, left cortical, verterobasilar, other):   If yes: Timing of event (intra-op, <6 hrs post-op, >=6 hrs post-op, unknown):   2. CN injury: No  If yes: CN  injuried   3. Myocardial infarction: No  If yes: Dx by (EKG or clinical, Troponin):   4.  CHF: No  5.  Dysrhythmia (new): No  6. Wound infection: No  7. Reperfusion symptoms: No  8. Return to OR: No  If yes: return to OR for (bleeding, neurologic, other CEA  incision, other):   Discharge medications: Statin use:  Yes ASA use:  Yes Beta blocker use:  Yes ACE-Inhibitor use:  Yes P2Y12 Antagonist use: [x ] None, [ ]  Plavix, [ ]  Plasugrel, [ ]  Ticlopinine, [ ]  Ticagrelor, [ ]  Other, [ ]  No for medical reason, [ ]  Non-compliant, [x ] Not-indicated Anti-coagulant use:  [x ] None, [ ]  Warfarin, [ ]  Rivaroxaban, [ ]  Dabigatran, [ ]  Other, [ ]  No for medical reason, [ ]  Non-compliant, [x ] Not-indicated

## 2018-01-11 NOTE — Progress Notes (Signed)
Cardiology Office Note:    Date:  01/12/2018   ID:  Lance Ayala, DOB [birth year], MRN [id number]  PCP:  Anselmo Pickler, MD  Cardiologist:  Norman Herrlich, MD    Referring MD: Anselmo Pickler, *    ASSESSMENT:    1. Coronary artery disease involving native coronary artery of native heart with angina pectoris (HCC)   2. Hypertensive heart disease with heart failure (HCC)   3. Chronic systolic heart failure (HCC)   4. Stenosis of right carotid artery    PLAN:    In order of problems listed above:  1. He will continue current medical treatment pending elective bypass surgery he is doing to be seen by CTS in the next week now that he has had successful carotid endarterectomy 2. Stable continue current diuretic beta-blocker ACE inhibitor and MRA pending bypass surgery.  Postoperatively if EF remains reduced I will transition him to ARNI 3. Stable compensated continue current diuretic 4. Improved recent successful CEA   Next appointment: After bypass surgery   Medication Adjustments/Labs and Tests Ordered: Current medicines are reviewed at length with the patient today.  Concerns regarding medicines are outlined above.  No orders of the defined types were placed in this encounter.  No orders of the defined types were placed in this encounter.   Chief Complaint  Patient presents with  . Follow-up    6 week flup appt   . Congestive Heart Failure  . Coronary Artery Disease    History of Present Illness:    Lance Ayala is a 62 y.o. male with a hx of heart failure, EF 25%, Severe multivessel CAD  with moderate distal left main stenosis, severe ramus/OM1 stenoses, severe RCA stenosis, and severe LAD/diagonal stenosis awaiting CABG after CEA right 12/28/17 last seen by me  11/13/17..  Compliance with diet, lifestyle and medications: yes  he has recovered nicely from carotid endarterectomy Is not short of breath no chest pain palpitation or syncope but feels fatigued.   He is awaiting CABG.  If EF remains less than 35 he will need to be considered for ICD after convalescence from bypass surgery Past Medical History:  Diagnosis Date  . CAD (coronary artery disease) 10/21/2017  . Chest pain 10/21/2017  . Chest tightness 10/21/2017  . Cholelithiasis   . Congestive heart failure (CHF) (HCC) 10/21/2017  . COPD (chronic obstructive pulmonary disease) (HCC) 10/21/2017  . Hypertension 10/21/2017  . Lung nodule   . Myocardial infarction (HCC) 10/21/2017  . Stroke [facility])    " Mini stroke in 2000"  . Wears dentures     Past Surgical History:  Procedure Laterality Date  . CORONARY ANGIOPLASTY WITH STENT PLACEMENT     2 stents  . ENDARTERECTOMY Right 12/28/2017   Procedure: RIGHT CAROTID ENDARTERECTOMY;  Surgeon: [name], MD;  Location: [facility] OR;  Service: Vascular;  Laterality: Right;  . LEFT HEART CATH AND CORONARY ANGIOGRAPHY N/A 10/29/2017   Procedure: LEFT HEART CATH AND CORONARY ANGIOGRAPHY;  Surgeon: Tonny Bollman, MD;  Location: [facility] INVASIVE CV LAB;  Service: Cardiovascular;  Laterality: N/A;  . MULTIPLE TOOTH EXTRACTIONS    . PATCH ANGIOPLASTY Right 12/28/2017   Procedure: WITH  HEMASHIELD  0.3 X 3 IN  USE AS PATCH ANGIOPLASTY;  Surgeon: [name], MD;  Location: [facility] OR;  Service: Vascular;  Laterality: Right;    Current Medications: Current Meds  Medication Sig  . acetaminophen (TYLENOL) 325 MG tablet Take 325-650 mg by mouth every 6 (six) hours  as needed for moderate pain or headache.  Marland Kitchen aspirin EC 81 MG tablet Take 81 mg by mouth daily.  Marland Kitchen atorvastatin (LIPITOR) 80 MG tablet Take 1 tablet (80 mg total) by mouth every evening.  . carvedilol (COREG) 3.125 MG tablet Take 1 tablet (3.125 mg total) by mouth 2 (two) times daily with a meal.  . furosemide (LASIX) 40 MG tablet Take 1 tablet (40 mg total) by mouth daily.  Marland Kitchen lisinopril (PRINIVIL,ZESTRIL) 10 MG tablet Take 1 tablet (10 mg total) by mouth at bedtime.  . nitroGLYCERIN (NITROSTAT) 0.4 MG  SL tablet Place 0.4 mg under the tongue every 5 (five) minutes as needed for chest pain.  Marland Kitchen spironolactone (ALDACTONE) 25 MG tablet Take 1 tablet (25 mg total) by mouth daily.     Allergies:   Patient has no known allergies.   Social History   Socioeconomic History  . Marital status: Single    Spouse name: None  . Number of children: None  . Years of education: None  . Highest education level: None  Social Needs  . Financial resource strain: None  . Food insecurity - worry: None  . Food insecurity - inability: None  . Transportation needs - medical: None  . Transportation needs - non-medical: None  Occupational History  . None  Tobacco Use  . Smoking status: Former Smoker    Packs/day: 1.00    Years: 20.00    Pack years: 20.00    Last attempt to quit: 12/01/1998    Years since quitting: 19.1  . Smokeless tobacco: Never Used  Substance and Sexual Activity  . Alcohol use: No    Frequency: Never  . Drug use: No  . Sexual activity: None  Other Topics Concern  . None  Social History Narrative  . None     Family History: The patient's family history includes Heart attack in his father; Hypertension in his mother; Lung disease in his brother; Stroke in his mother. ROS:   Please see the history of present illness.    All other systems reviewed and are negative.  EKGs/Labs/Other Studies Reviewed:    The following studies were reviewed today:    Recent Labs: 11/13/2017: NT-Pro BNP 614 12/25/2017: ALT 21 12/29/2017: BUN 19; Creatinine, Ser 1.13; Hemoglobin 12.1; Platelets 182; Potassium 4.3; Sodium 137  Recent Lipid Panel No results found for: CHOL, TRIG, HDL, CHOLHDL, VLDL, LDLCALC, LDLDIRECT  Physical Exam:    VS:  BP 134/88 (BP Location: Right Arm, Patient Position: Sitting, Cuff Size: Normal)   Pulse 86   Ht 5\' 6"  (1.676 m)   Wt 159 lb 6.4 oz (72.3 kg)   SpO2 98%   BMI 25.73 kg/m     Wt Readings from Last 3 Encounters:  01/12/18 159 lb 6.4 oz (72.3 kg)    12/25/17 158 lb 1.6 oz (71.7 kg)  12/17/17 159 lb (72.1 kg)    GEN:  Well nourished, well developed in no acute distress HEENT: Normal NECK: No JVD; No carotid bruits LYMPHATICS: No lymphadenopathy CARDIAC: RRR, no murmurs, rubs, gallops RESPIRATORY:  Clear to auscultation without rales, wheezing or rhonchi  ABDOMEN: Soft, non-tender, non-distended MUSCULOSKELETAL:  No edema; No deformity  SKIN: Warm and dry NEUROLOGIC:  Alert and oriented x 3 PSYCHIATRIC:  Normal affect    Signed, Norman Herrlich, MD  01/12/2018 2:23 PM    [facility]

## 2018-01-12 ENCOUNTER — Ambulatory Visit: Payer: Self-pay | Admitting: Cardiology

## 2018-01-12 ENCOUNTER — Encounter: Payer: Self-pay | Admitting: Cardiology

## 2018-01-12 VITALS — BP 134/88 | HR 86 | Ht 66.0 in | Wt 159.4 lb

## 2018-01-12 DIAGNOSIS — I6521 Occlusion and stenosis of right carotid artery: Secondary | ICD-10-CM

## 2018-01-12 DIAGNOSIS — I5022 Chronic systolic (congestive) heart failure: Secondary | ICD-10-CM

## 2018-01-12 DIAGNOSIS — I110 Hypertensive heart disease with heart failure: Secondary | ICD-10-CM

## 2018-01-12 DIAGNOSIS — I25119 Atherosclerotic heart disease of native coronary artery with unspecified angina pectoris: Secondary | ICD-10-CM

## 2018-01-12 NOTE — Patient Instructions (Signed)
Medication Instructions:  Your physician recommends that you continue on your current medications as directed. Please refer to the Current Medication list given to you today.  Labwork: None  Testing/Procedures: None  Follow-Up: Your physician recommends that you schedule a follow-up appointment as needed after CABG.  Any Other Special Instructions Will Be Listed Below (If Applicable).     If you need a refill on your cardiac medications before your next appointment, please call your pharmacy.

## 2018-01-13 ENCOUNTER — Ambulatory Visit (INDEPENDENT_AMBULATORY_CARE_PROVIDER_SITE_OTHER): Payer: Self-pay | Admitting: Cardiothoracic Surgery

## 2018-01-13 ENCOUNTER — Encounter: Payer: Self-pay | Admitting: Cardiothoracic Surgery

## 2018-01-13 ENCOUNTER — Other Ambulatory Visit: Payer: Self-pay | Admitting: *Deleted

## 2018-01-13 ENCOUNTER — Other Ambulatory Visit: Payer: Self-pay

## 2018-01-13 VITALS — BP 95/61 | HR 84 | Resp 18 | Ht 66.0 in | Wt 159.6 lb

## 2018-01-13 DIAGNOSIS — I25119 Atherosclerotic heart disease of native coronary artery with unspecified angina pectoris: Secondary | ICD-10-CM

## 2018-01-13 DIAGNOSIS — I2510 Atherosclerotic heart disease of native coronary artery without angina pectoris: Secondary | ICD-10-CM

## 2018-01-13 NOTE — Progress Notes (Signed)
PCP is [name], [name], MD Referring Provider is Tonny Bollman, MD  Chief Complaint  Patient presents with  . Coronary Artery Disease    f/u after carotid surgery, CABG evaluation and scheduling     HPI: The patient is a 62 year old reformed smoker with known 3 vessel CAD and moderate LV dysfunction with exertional chest pain. He was evaluated for CABG weight was found have a tight right 90% carotid stenosis. He underwent endarterectomy 2 weeks ago by Dr. early and did well. He is now recovered and ready to schedule his CABG.  The patient is nondiabetic former smoker with ejection fraction of approximately 30 %. He is in sinus rhythm. He had previous PCI at [facility] a few years ago. His coronary arteriograms show 75-80% stenosis the LAD diagonal, 80% stenosis of the distal RCA and 8090% stenosis of the ramus and circumflex marginal. LVEDP is 16 mmHg.  Echocardiogram images show moderate global dysfunction EF 35-40% without significant valvular disease. RV function is satisfactory.  CT scan shows moderate changes of COPD with mild mediastinal adenopathy  Past Medical History:  Diagnosis Date  . CAD (coronary artery disease) 10/21/2017  . Chest pain 10/21/2017  . Chest tightness 10/21/2017  . Cholelithiasis   . Congestive heart failure (CHF) (HCC) 10/21/2017  . COPD (chronic obstructive pulmonary disease) (HCC) 10/21/2017  . Hypertension 10/21/2017  . Lung nodule   . Myocardial infarction (HCC) 10/21/2017  . Stroke [facility])    " Mini stroke in 2000"  . Wears dentures     Past Surgical History:  Procedure Laterality Date  . CORONARY ANGIOPLASTY WITH STENT PLACEMENT     2 stents  . ENDARTERECTOMY Right 12/28/2017   Procedure: RIGHT CAROTID ENDARTERECTOMY;  Surgeon: [name], MD;  Location: [facility] OR;  Service: Vascular;  Laterality: Right;  . LEFT HEART CATH AND CORONARY ANGIOGRAPHY N/A 10/29/2017   Procedure: LEFT HEART CATH AND CORONARY ANGIOGRAPHY;  Surgeon:  Tonny Bollman, MD;  Location: [facility] INVASIVE CV LAB;  Service: Cardiovascular;  Laterality: N/A;  . MULTIPLE TOOTH EXTRACTIONS    . PATCH ANGIOPLASTY Right 12/28/2017   Procedure: WITH  HEMASHIELD  0.3 X 3 IN  USE AS PATCH ANGIOPLASTY;  Surgeon: [name], MD;  Location: [facility] OR;  Service: Vascular;  Laterality: Right;    Family History  Problem Relation Age of Onset  . Hypertension Mother   . Stroke Mother   . Heart attack Father   . Lung disease Brother     Social History Social History   Tobacco Use  . Smoking status: Former Smoker    Packs/day: 1.00    Years: 20.00    Pack years: 20.00    Last attempt to quit: 12/01/1998    Years since quitting: 19.1  . Smokeless tobacco: Never Used  Substance Use Topics  . Alcohol use: No    Frequency: Never  . Drug use: No    Current Outpatient Medications  Medication Sig Dispense Refill  . acetaminophen (TYLENOL) 325 MG tablet Take 325-650 mg by mouth every 6 (six) hours as needed for moderate pain or headache.    Marland Kitchen aspirin EC 81 MG tablet Take 81 mg by mouth daily.    Marland Kitchen atorvastatin (LIPITOR) 80 MG tablet Take 1 tablet (80 mg total) by mouth every evening. 90 tablet 1  . carvedilol (COREG) 3.125 MG tablet Take 1 tablet (3.125 mg total) by mouth 2 (two) times daily with a meal. 120 tablet 2  . furosemide (LASIX)  40 MG tablet Take 1 tablet (40 mg total) by mouth daily. 90 tablet 1  . lisinopril (PRINIVIL,ZESTRIL) 10 MG tablet Take 1 tablet (10 mg total) by mouth at bedtime. 90 tablet 1  . nitroGLYCERIN (NITROSTAT) 0.4 MG SL tablet Place 0.4 mg under the tongue every 5 (five) minutes as needed for chest pain.    Marland Kitchen spironolactone (ALDACTONE) 25 MG tablet Take 1 tablet (25 mg total) by mouth daily. 90 tablet 1   No current facility-administered medications for this visit.     No Known Allergies  Review of Systems  No weight loss or fever No headaches or blurred vision after carotid endarterectomy Shortness of breath with exertion no  resting symptoms of chest pain or shortness of breath No nausea vomiting [him No nocturia or hematuria Positive history of arthritis in his hands, no bleeding problems with carotid surgery. Patient is right-hand dominant  BP 95/61 (BP Location: Right Arm, Patient Position: Sitting, Cuff Size: Normal)   Pulse 84   Resp 18   Ht 5\' 6"  (1.676 m)   Wt 159 lb 9.6 oz (72.4 kg)   SpO2 95% Comment: RA  BMI 25.76 kg/m  Physical Exam      Physical Exam  General: Short or weight middle-aged male no acute distress HEENT: Normocephalic pupils equal , dentition adequate Neck: Supple without JVD, adenopathy, or bruit           Right neck incision is well-healed. Chest: Clear to auscultation, symmetrical breath sounds, no rhonchi, no tenderness             or deformity Cardiovascular: Regular rate and rhythm, no murmur, no gallop, peripheral pulses             palpable in all extremities Abdomen:  Soft, nontender, no palpable mass or organomegaly Extremities: Warm, well-perfused, no clubbing cyanosis edema or tenderness,              no venous stasis changes of the legs Rectal/GU: Deferred Neuro: Grossly non--focal and symmetrical throughout Skin: Clean and dry without rash or ulceration    Diagnostic Tests: Coronary angiogram showed three-vessel CAD with moderate LV dysfunction Echocardiogram shows no significant valvular disease  Impression: Three-vessel CAD with moderate LV dysfunction  Plan: Patient be scheduled for multivessel CABG on Monday, February 25 at [facility] hospital. I discussed the indications benefits and risks associated with surgery and he understands and agrees to proceed.  Mikey Bussing, MD [facility] [phone]

## 2018-01-19 ENCOUNTER — Encounter: Payer: Self-pay | Admitting: Vascular Surgery

## 2018-01-19 ENCOUNTER — Ambulatory Visit (INDEPENDENT_AMBULATORY_CARE_PROVIDER_SITE_OTHER): Payer: Self-pay | Admitting: Vascular Surgery

## 2018-01-19 ENCOUNTER — Other Ambulatory Visit: Payer: Self-pay

## 2018-01-19 VITALS — BP 107/70 | HR 79 | Temp 97.6°F | Resp 16 | Ht 66.0 in | Wt 162.2 lb

## 2018-01-19 DIAGNOSIS — I6523 Occlusion and stenosis of bilateral carotid arteries: Secondary | ICD-10-CM

## 2018-01-19 NOTE — Progress Notes (Signed)
    Postoperative Visit   History of Present Illness   Lance Ayala is a 62 y.o. male who presents for postoperative follow-up for: right CEA (Date: 12/28/17).  The patient's neck incision is well healed.  The patient has had not stroke or TIA symptoms such as slurring speech, changes in vision, or one sided weakness.  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  He is scheduled for coronary bypass grafting by Dr. Donata Clay on 01/25/18.  He is taking his aspirin and statin daily.  He denies tobacco use.  Current Outpatient Medications  Medication Sig Dispense Refill  . acetaminophen (TYLENOL) 325 MG tablet Take 325-650 mg by mouth every 6 (six) hours as needed for moderate pain or headache.    Marland Kitchen aspirin EC 81 MG tablet Take 81 mg by mouth daily.    Marland Kitchen atorvastatin (LIPITOR) 80 MG tablet Take 1 tablet (80 mg total) by mouth every evening. 90 tablet 1  . carvedilol (COREG) 3.125 MG tablet Take 1 tablet (3.125 mg total) by mouth 2 (two) times daily with a meal. 120 tablet 2  . furosemide (LASIX) 40 MG tablet Take 1 tablet (40 mg total) by mouth daily. 90 tablet 1  . lisinopril (PRINIVIL,ZESTRIL) 10 MG tablet Take 1 tablet (10 mg total) by mouth at bedtime. 90 tablet 1  . nitroGLYCERIN (NITROSTAT) 0.4 MG SL tablet Place 0.4 mg under the tongue every 5 (five) minutes as needed for chest pain.    Marland Kitchen spironolactone (ALDACTONE) 25 MG tablet Take 1 tablet (25 mg total) by mouth daily. 90 tablet 1   No current facility-administered medications for this visit.     For VQI Use Only   PRE-ADM LIVING: Home  AMB STATUS: Ambulatory   Physical Examination   Vitals:   01/19/18 1519 01/19/18 1520  BP: 136/72 107/70  Pulse: 79   Resp: 16   Temp: 97.6 F (36.4 C)   TempSrc: Oral   SpO2: 97%   Weight: 162 lb 3.2 oz (73.6 kg)   Height: 5\' 6"  (1.676 m)     right Neck: Incision is well healed without dehiscence or sign of infection  Neuro: CN 2-12 are intact, Motor strength is 5/5  bilaterally,  sensation is grossly intact   Medical Decision Making   Lance Ayala is a 62 y.o. male who presents s/p right CEA.   Well healed R CEA surgical site  Follow up in 6 months with bilateral carotid duplex per protocol  Continue aspirin and statin therapy  Patient is scheduled for coronary bypass surgery with Dr. Donata Clay 01/25/18  Emilie Rutter, PA-C [facility] Office: [phone]  I have examined the patient, reviewed and agree with above.  Uneventful recovery from right carotid endarterectomy for asymptomatic disease.  Is stable for coronary bypass grafting as planned for this coming Monday  Gretta Began, MD 01/19/2018 4:06 PM

## 2018-01-20 NOTE — Addendum Note (Signed)
Addended by: Nickola Lenig A on: 01/20/2018 12:28 PM   Modules accepted: Orders  

## 2018-01-20 NOTE — Pre-Procedure Instructions (Signed)
Lance Ayala  01/20/2018      [facility] [place], [place] - [address] [place] [address] Phone: [phone] Fax: [phone]    Your procedure is scheduled on Feb 25  Report to [facility] Admitting at 530 A.M.  Call this number if you have problems the morning of surgery:  [phone]   Remember:  Do not eat food or drink liquids after midnight.  Take these medicines the morning of surgery with A SIP OF WATER Carvedilol (Coreg), Nitrostat If needed  Stop taking aspirin as directed by your Dr. Stop taking BC's, Goody's, Herbal medications, Fish Oil, Ibuprofen, Advil, Motrin, Aleve   Do not wear jewelry, make-up or nail polish.  Do not wear lotions, powders, or perfumes, or deodorant.  Do not shave 48 hours prior to surgery.  Men may shave face and neck.  Do not bring valuables to the hospital.  [facility] is not responsible for any belongings or valuables.  Contacts, dentures or bridgework may not be worn into surgery.  Leave your suitcase in the car.  After surgery it may be brought to your room.  For patients admitted to the hospital, discharge time will be determined by your treatment team.  Patients discharged the day of surgery will not be allowed to drive home.    Special instructions:  [facility] - Preparing for Surgery  Before surgery, you can play an important role.  Because skin is not sterile, your skin needs to be as free of germs as possible.  You can reduce the number of germs on you skin by washing with CHG (chlorahexidine gluconate) soap before surgery.  CHG is an antiseptic cleaner which kills germs and bonds with the skin to continue killing germs even after washing.  Please DO NOT use if you have an allergy to CHG or antibacterial soaps.  If your skin becomes reddened/irritated stop using the CHG and inform your nurse when you arrive at Short Stay.  Do not shave (including legs and  underarms) for at least 48 hours prior to the first CHG shower.  You may shave your face.  Please follow these instructions carefully:   1.  Shower with CHG Soap the night before surgery and the   morning of Surgery.  2.  If you choose to wash your hair, wash your hair first as usual with your normal shampoo.  3.  After you shampoo, rinse your hair and body thoroughly to remove the   Shampoo.  4.  Use CHG as you would any other liquid soap.  You can apply chg directly to the skin and wash gently with scrungie or a clean washcloth.  5.  Apply the CHG Soap to your body ONLY FROM THE NECK DOWN.  Do not use on open wounds or open sores.  Avoid contact with your eyes,  ears, mouth and genitals (private parts).  Wash genitals (private parts)       with your normal soap.  6.  Wash thoroughly, paying special attention to the area where your surgery  will be performed.  7.  Thoroughly rinse your body with warm water from the neck down.  8.  DO NOT shower/wash with your normal soap after using and rinsing off the CHG Soap.  9.  Pat yourself dry with a clean towel.            10.  Wear clean pajamas.  11.  Place clean sheets on your bed the night of your first shower and do not  sleep with pets.  Day of Surgery  Do not apply any lotions/deoderants the morning of surgery.  Please wear clean clothes to the hospital/surgery center.     Please read over the following fact sheets that you were given. Pain Booklet, Coughing and Deep Breathing, MRSA Information and Surgical Site Infection Prevention

## 2018-01-21 ENCOUNTER — Ambulatory Visit (HOSPITAL_COMMUNITY)
Admission: RE | Admit: 2018-01-21 | Discharge: 2018-01-21 | Disposition: A | Discharge status: Home / Self Care | Payer: Medicaid Other | Source: Ambulatory Visit | Attending: Cardiothoracic Surgery | Admitting: Cardiothoracic Surgery

## 2018-01-21 ENCOUNTER — Encounter (HOSPITAL_COMMUNITY): Payer: Self-pay

## 2018-01-21 ENCOUNTER — Ambulatory Visit (HOSPITAL_BASED_OUTPATIENT_CLINIC_OR_DEPARTMENT_OTHER)
Admission: RE | Admit: 2018-01-21 | Discharge: 2018-01-21 | Disposition: A | Discharge status: Home / Self Care | Payer: Medicaid Other | Source: Ambulatory Visit | Attending: Cardiothoracic Surgery | Admitting: Cardiothoracic Surgery

## 2018-01-21 ENCOUNTER — Other Ambulatory Visit: Payer: Self-pay

## 2018-01-21 ENCOUNTER — Encounter (HOSPITAL_COMMUNITY)
Admission: RE | Admit: 2018-01-21 | Discharge: 2018-01-21 | Disposition: A | Discharge status: Home / Self Care | Payer: Medicaid Other | Source: Ambulatory Visit | Attending: Cardiothoracic Surgery | Admitting: Cardiothoracic Surgery

## 2018-01-21 DIAGNOSIS — I6522 Occlusion and stenosis of left carotid artery: Secondary | ICD-10-CM | POA: Insufficient documentation

## 2018-01-21 DIAGNOSIS — Z01818 Encounter for other preprocedural examination: Secondary | ICD-10-CM | POA: Insufficient documentation

## 2018-01-21 DIAGNOSIS — J984 Other disorders of lung: Secondary | ICD-10-CM | POA: Insufficient documentation

## 2018-01-21 DIAGNOSIS — R942 Abnormal results of pulmonary function studies: Secondary | ICD-10-CM | POA: Insufficient documentation

## 2018-01-21 DIAGNOSIS — J449 Chronic obstructive pulmonary disease, unspecified: Secondary | ICD-10-CM | POA: Insufficient documentation

## 2018-01-21 DIAGNOSIS — I2510 Atherosclerotic heart disease of native coronary artery without angina pectoris: Secondary | ICD-10-CM

## 2018-01-21 DIAGNOSIS — I447 Left bundle-branch block, unspecified: Secondary | ICD-10-CM | POA: Diagnosis not present

## 2018-01-21 DIAGNOSIS — R9431 Abnormal electrocardiogram [ECG] [EKG]: Secondary | ICD-10-CM | POA: Insufficient documentation

## 2018-01-21 LAB — COMPREHENSIVE METABOLIC PANEL
ALT: 22 U/L (ref 17–63)
AST: 20 U/L (ref 15–41)
Albumin: 3.8 g/dL (ref 3.5–5.0)
Alkaline Phosphatase: 87 U/L (ref 38–126)
Anion gap: 10 (ref 5–15)
BUN: 24 mg/dL — ABNORMAL HIGH (ref 6–20)
CO2: 22 mmol/L (ref 22–32)
Calcium: 9.2 mg/dL (ref 8.9–10.3)
Chloride: 104 mmol/L (ref 101–111)
Creatinine, Ser: 1.14 mg/dL (ref 0.61–1.24)
GFR calc Af Amer: >60 mL/min (ref >60)
GFR calc non Af Amer: >60 mL/min (ref >60)
Glucose, Bld: 118 mg/dL — ABNORMAL HIGH (ref 65–99)
Potassium: 4.7 mmol/L (ref 3.5–5.1)
Sodium: 136 mmol/L (ref 135–145)
Total Bilirubin: 0.7 mg/dL (ref 0.3–1.2)
Total Protein: 6.6 g/dL (ref 6.5–8.1)

## 2018-01-21 LAB — URINALYSIS, ROUTINE W REFLEX MICROSCOPIC
Bilirubin Urine: NEGATIVE
Glucose, UA: NEGATIVE mg/dL
Hgb urine dipstick: NEGATIVE
Ketones, ur: NEGATIVE mg/dL
Leukocytes, UA: NEGATIVE
Nitrite: NEGATIVE
Protein, ur: NEGATIVE mg/dL
Specific Gravity, Urine: 1.023 (ref 1.005–1.030)
pH: 5 (ref 5.0–8.0)

## 2018-01-21 LAB — CBC
HCT: 38.7 % — ABNORMAL LOW (ref 39.0–52.0)
Hemoglobin: 12.9 g/dL — ABNORMAL LOW (ref 13.0–17.0)
MCH: 31.2 pg (ref 26.0–34.0)
MCHC: 33.3 g/dL (ref 30.0–36.0)
MCV: 93.5 fL (ref 78.0–100.0)
Platelets: 235 10*3/uL (ref 150–400)
RBC: 4.14 MIL/uL — ABNORMAL LOW (ref 4.22–5.81)
RDW: 13.1 % (ref 11.5–15.5)
WBC: 9.7 10*3/uL (ref 4.0–10.5)

## 2018-01-21 LAB — PULMONARY FUNCTION TEST
DL/VA % pred: 68 %
DL/VA: 2.98 ml/min/mmHg/L
DLCO cor % pred: 54 %
DLCO cor: 14.71 ml/min/mmHg
DLCO unc % pred: 51 %
DLCO unc: 13.96 ml/min/mmHg
FEF 25-75 Post: 0.69 L/s
FEF 25-75 Pre: 0.51 L/s
FEF2575-%Change-Post: 35 %
FEF2575-%Pred-Post: 27 %
FEF2575-%Pred-Pre: 20 %
FEV1-%Change-Post: 16 %
FEV1-%Pred-Post: 52 %
FEV1-%Pred-Pre: 44 %
FEV1-Post: 1.59 L
FEV1-Pre: 1.37 L
FEV1FVC-%Change-Post: 4 %
FEV1FVC-%Pred-Pre: 59 %
FEV6-%Change-Post: 9 %
FEV6-%Pred-Post: 77 %
FEV6-%Pred-Pre: 70 %
FEV6-Post: 2.97 L
FEV6-Pre: 2.71 L
FEV6FVC-%Change-Post: -1 %
FEV6FVC-%Pred-Post: 91 %
FEV6FVC-%Pred-Pre: 92 %
FVC-%Change-Post: 11 %
FVC-%Pred-Post: 84 %
FVC-%Pred-Pre: 75 %
FVC-Post: 3.43 L
FVC-Pre: 3.08 L
Post FEV1/FVC ratio: 46 %
Post FEV6/FVC ratio: 87 %
Pre FEV1/FVC ratio: 44 %
Pre FEV6/FVC Ratio: 88 %
RV % pred: 204 %
RV: 4.21 L
TLC % pred: 122 %
TLC: 7.58 L

## 2018-01-21 LAB — BLOOD GAS, ARTERIAL
Acid-Base Excess: 1 mmol/L (ref 0.0–2.0)
Bicarbonate: 24.9 mmol/L (ref 20.0–28.0)
Drawn by: 421801
FIO2: 21
O2 Saturation: 97.5 %
Patient temperature: 98.6
pCO2 arterial: 38.3 mmHg (ref 32.0–48.0)
pH, Arterial: 7.428 (ref 7.350–7.450)
pO2, Arterial: 98.5 mmHg (ref 83.0–108.0)

## 2018-01-21 LAB — PROTIME-INR
INR: 0.97
Prothrombin Time: 12.8 seconds (ref 11.4–15.2)

## 2018-01-21 LAB — APTT: aPTT: 23 s — ABNORMAL LOW (ref 24–36)

## 2018-01-21 LAB — HEMOGLOBIN A1C
Hgb A1c MFr Bld: 5.6 % (ref 4.8–5.6)
Mean Plasma Glucose: 114.02 mg/dL

## 2018-01-21 MED ORDER — ALBUTEROL SULFATE (2.5 MG/3ML) 0.083% IN NEBU
2.5000 mg | INHALATION_SOLUTION | Freq: Once | RESPIRATORY_TRACT | Status: AC
  Administered 2018-01-21: 2.5 mg via RESPIRATORY_TRACT

## 2018-01-21 NOTE — Progress Notes (Signed)
Pre-op Cardiac Surgery  Carotid Findings:  Right CEA is patent. Left 60-79% ICA stenosis.  Right vertebral artery flow presents "bunny sign." Left vertebral artery flow is antegrade.   Upper Extremity Right Left  Brachial Pressures 102B 126T  Radial Waveforms B B  Ulnar Waveforms B T  Palmar Arch (Allen's Test) Doppler signal remains normal with radial compression and obliterates with ulnar compression Doppler signal remains normal with radial compression and obliterates with ulnar compression   Findings:      Lower  Extremity Right Left  Dorsalis Pedis    Anterior Tibial T T  Posterior Tibial T T  Ankle/Brachial Indices      Findings:

## 2018-01-21 NOTE — Progress Notes (Addendum)
PCP is Dr Leonette Monarch Cardiologist is Dr. Dulce Sellar Denies chest pain, Cough, or fever. Echo 11-27-17 Stress test 10-16-17 Card cath 10-29-17 States he will hold aspirin on the day of surgery.  See Marylene Land Kabbe's note 12-25-17

## 2018-01-21 NOTE — Progress Notes (Signed)
Ryan at Dr Zenaida Niece Trigt's office called and informed to have Dr Donata Clay look over CXR report.

## 2018-01-22 ENCOUNTER — Other Ambulatory Visit: Payer: Self-pay | Admitting: Cardiology

## 2018-01-22 NOTE — Progress Notes (Signed)
Anesthesia Chart Review: Patient is a 62 year old male scheduled for CABG on 01/25/2018 by Dr. Kathlee Nations Trigt. He is s/p right CEA 12/28/17. See anesthesia note by Rica Mast, NP on 12/25/17 which outlines history and recent cardiac studies.   BP (!) 118/50   Pulse 74   Temp 36.7 C   Resp 18   Ht 5\' 6"  (1.676 m)   Wt 160 lb (72.6 kg)   SpO2 98%   BMI 25.82 kg/m   Meds include ASA 81 mg, Lipitor, Coreg, Lasix, lisinopril, Nitro.  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  Carotid U/S (Preliminary) 01/21/18: Carotid Findings:  Right CEA is patent. Left 60-79% ICA stenosis.  Right vertebral artery flow presents "bunny sign." Left vertebral artery flow is antegrade.   CXR 01/21/18: IMPRESSION: Mild left base subsegmental atelectasis and/or scarring exam is otherwise negative.  CT Chest 10/15/17 [facility]): Impression: 1.  Resolution of much of the previously noted groundglass densities within the right greater than left lungs.  There are at least 2 pulmonary nodules on the right, one within the middle lobe and the other within the lower lobe, the larger of the 2 lesions measures 8 mm and is unchanged.  Future CT at 18-24 months is considered optional for lobe risk patients, but is recommended for high risk patients. 2.  Slight interval increase in left axillary mediastinal adenopathy since the previous CT.  Findings are still nonspecific and could be reactive, if lymphoproliferative abnormality suspected, correlation with PET-CT could be considered. 3.  Small bilateral pleural effusions. 4.  Small gallstones.  Preoperative labs noted. Cr 1.14. Glucose 118. H/H 12.9/38.7. PLT 235. PT 12.8, INR 0.97, PTT 23. A1c 5.6.   Anesthesiologist to evaluate on the day of surgery.  Velna Ochs [facility] Short Stay Center/Anesthesiology Phone [phone] 01/22/2018 5:32 PM

## 2018-01-23 MED ORDER — HEPARIN SODIUM (PORCINE) 1000 UNIT/ML IJ SOLN
INTRAMUSCULAR | Status: DC
  Filled 2018-01-23: qty 30

## 2018-01-23 MED ORDER — SODIUM CHLORIDE 0.9 % IV SOLN
INTRAVENOUS | Status: AC
  Administered 2018-01-25: 1 [IU]/h via INTRAVENOUS
  Filled 2018-01-23: qty 1

## 2018-01-23 MED ORDER — SODIUM CHLORIDE 0.9 % IV SOLN
1.5000 g | INTRAVENOUS | Status: AC
  Administered 2018-01-25: 1.5 g via INTRAVENOUS
  Filled 2018-01-23: qty 1.5

## 2018-01-23 MED ORDER — TRANEXAMIC ACID (OHS) BOLUS VIA INFUSION
15.0000 mg/kg | INTRAVENOUS | Status: AC
  Administered 2018-01-25: 1089 mg via INTRAVENOUS
  Filled 2018-01-23: qty 1089

## 2018-01-23 MED ORDER — EPINEPHRINE PF 1 MG/ML IJ SOLN
0.0000 ug/min | INTRAMUSCULAR | Status: DC
  Filled 2018-01-23: qty 4

## 2018-01-23 MED ORDER — SODIUM CHLORIDE 0.9 % IV SOLN
750.0000 mg | INTRAVENOUS | Status: AC
  Administered 2018-01-25: .75 g via INTRAVENOUS
  Filled 2018-01-23: qty 750

## 2018-01-23 MED ORDER — PLASMA-LYTE 148 IV SOLN
INTRAVENOUS | Status: DC
  Filled 2018-01-23: qty 2.5

## 2018-01-23 MED ORDER — TRANEXAMIC ACID 1000 MG/10ML IV SOLN
1.5000 mg/kg/h | INTRAVENOUS | Status: AC
  Administered 2018-01-25: 1.5 mg/kg/h via INTRAVENOUS
  Filled 2018-01-23: qty 25

## 2018-01-23 MED ORDER — TRANEXAMIC ACID (OHS) PUMP PRIME SOLUTION
2.0000 mg/kg | INTRAVENOUS | Status: DC
  Filled 2018-01-23: qty 1.45

## 2018-01-23 MED ORDER — DEXMEDETOMIDINE HCL IN NACL 400 MCG/100ML IV SOLN
0.1000 ug/kg/h | INTRAVENOUS | Status: AC
  Administered 2018-01-25: .5 ug/kg/h via INTRAVENOUS
  Filled 2018-01-23: qty 100

## 2018-01-23 MED ORDER — DOPAMINE-DEXTROSE 3.2-5 MG/ML-% IV SOLN
0.0000 ug/kg/min | INTRAVENOUS | Status: DC
  Filled 2018-01-23: qty 250

## 2018-01-23 MED ORDER — MAGNESIUM SULFATE 50 % IJ SOLN
40.0000 meq | INTRAMUSCULAR | Status: DC
  Filled 2018-01-23 (×2): qty 9.85

## 2018-01-23 MED ORDER — VANCOMYCIN HCL 10 G IV SOLR
1250.0000 mg | INTRAVENOUS | Status: AC
  Administered 2018-01-25: 1250 mg via INTRAVENOUS
  Filled 2018-01-23: qty 1250

## 2018-01-23 MED ORDER — NITROGLYCERIN IN D5W 200-5 MCG/ML-% IV SOLN
2.0000 ug/min | INTRAVENOUS | Status: DC
  Filled 2018-01-23: qty 250

## 2018-01-23 MED ORDER — POTASSIUM CHLORIDE 2 MEQ/ML IV SOLN
80.0000 meq | INTRAVENOUS | Status: DC
  Filled 2018-01-23: qty 40

## 2018-01-23 MED ORDER — PHENYLEPHRINE HCL 10 MG/ML IJ SOLN
30.0000 ug/min | INTRAMUSCULAR | Status: DC
  Filled 2018-01-23: qty 2

## 2018-01-24 NOTE — Anesthesia Preprocedure Evaluation (Addendum)
Anesthesia Evaluation  Patient identified by MRN, date of birth, ID band Patient awake    Reviewed: Allergy & Precautions, H&P , NPO status , Patient's Chart, lab work & pertinent test results, reviewed documented beta blocker date and time   Airway Mallampati: III  TM Distance: >3 FB Neck ROM: Full    Dental  (+) Edentulous Upper, Missing, Poor Dentition,    Pulmonary COPD (Patient denies), former smoker,    Pulmonary exam normal breath sounds clear to auscultation       Cardiovascular hypertension, Pt. on medications and Pt. on home beta blockers + angina + CAD, + Cardiac Stents (x 2. Last 4-5 years ago), + Peripheral Vascular Disease and +CHF  Normal cardiovascular exam Rhythm:Regular Rate:Normal  ECG: NSR, rate 70  ECHO: Left ventricle: The cavity size was normal. Wall thickness was normal. Systolic function was moderately reduced. The estimated ejection fraction was in the range of 35% to 40%. No mural thrombus with Definity contrast. Diffuse hypokinesis. Diffusely abnormal GLS at -12%. Doppler parameters are consistent with abnormal left ventricular relaxation (grade 1 diastolic dysfunction). The E/e&' ratio is >15, suggesting elevated LV filling pressure. Aortic valve: Sclerosis without stenosis. Transvalvular velocity was minimally increased. Mitral valve: Mildly thickened leaflets . There was trivial regurgitation. Left atrium: The atrium was normal in size. Inferior vena cava: The vessel was normal in size. The respirophasic diameter changes were in the normal range (= 50%), consistent with normal central venous pressure.  LVEF 35-40%, normal wall thickness, global hypokinesis, no mural thrombus, abnormal GLS at -12%, grade 1 DD, elevated LV filling pressure, aortic valve sclerosis, trivial MR, normal LA size, normal IVC.  Sees cardiologist [facility])  Previously placed Ost RCA to Prox RCA stent (unknown type) is widely  patent. Mid RCA lesion is 60% stenosed. Dist RCA lesion is 80% stenosed. Ost LAD lesion is 60% stenosed. Ost Ramus to Ramus lesion is 90% stenosed. Ost 1st Mrg to 1st Mrg lesion is 90% stenosed. Ost 1st Diag to 1st Diag lesion is 75% stenosed. Prox LAD to Mid LAD lesion is 75% stenosed. Mid LM to Dist LM lesion is 50% stenosed. The left ventricular ejection fraction is 25-35% by visual estimate. LV end diastolic pressure is normal. There is severe left ventricular systolic dysfunction.   1. Severe multivessel CAD as outlined above with moderate distal left main stenosis, severe ramus/OM1 stenoses, severe RCA stenosis, and severe LAD/diagonal stenosis 2. Severe segmental LV systolic dysfunction 3. 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), No Residual Symptoms negative psych ROS   GI/Hepatic negative GI ROS, Neg liver ROS,   Endo/Other  negative endocrine ROS  Renal/GU negative Renal ROS     Musculoskeletal   Abdominal   Peds  Hematology  (+) anemia , HLD   Anesthesia Other Findings   Reproductive/Obstetrics                            Anesthesia Physical Anesthesia Plan  ASA: IV  Anesthesia Plan: General   Post-op Pain Management:    Induction: Intravenous  PONV Risk Score and Plan: 2 and Treatment may vary due to age or medical condition  Airway Management Planned: Oral ETT  Additional Equipment: Arterial line, CVP, PA Cath, TEE and Ultrasound Guidance Line Placement  Intra-op Plan:   Post-operative Plan: Post-operative intubation/ventilation  Informed Consent: I have reviewed the patients History and Physical, chart, labs and discussed  the procedure including the risks, benefits and alternatives for the proposed anesthesia with the patient or authorized representative who has indicated his/her understanding and acceptance.   Dental advisory given  Plan Discussed with:  CRNA  Anesthesia Plan Comments:         Anesthesia Quick Evaluation

## 2018-01-25 ENCOUNTER — Inpatient Hospital Stay (HOSPITAL_COMMUNITY): Payer: Medicaid Other | Admitting: Anesthesiology

## 2018-01-25 ENCOUNTER — Inpatient Hospital Stay (HOSPITAL_COMMUNITY): Payer: Medicaid Other

## 2018-01-25 ENCOUNTER — Inpatient Hospital Stay (HOSPITAL_COMMUNITY)
Admission: RE | Disposition: A | Discharge status: Home / Self Care | Payer: Self-pay | Source: Ambulatory Visit | Attending: Cardiothoracic Surgery

## 2018-01-25 ENCOUNTER — Inpatient Hospital Stay (HOSPITAL_COMMUNITY): Payer: Medicaid Other | Admitting: Vascular Surgery

## 2018-01-25 ENCOUNTER — Encounter (HOSPITAL_COMMUNITY): Payer: Self-pay | Admitting: *Deleted

## 2018-01-25 ENCOUNTER — Other Ambulatory Visit: Payer: Self-pay

## 2018-01-25 ENCOUNTER — Inpatient Hospital Stay (HOSPITAL_COMMUNITY)
Admission: RE | Admit: 2018-01-25 | Discharge: 2018-01-31 | DRG: 236 | Disposition: A | Discharge status: Home / Self Care | Payer: Medicaid Other | Source: Ambulatory Visit | Attending: Cardiothoracic Surgery | Admitting: Cardiothoracic Surgery

## 2018-01-25 DIAGNOSIS — I110 Hypertensive heart disease with heart failure: Secondary | ICD-10-CM | POA: Diagnosis present

## 2018-01-25 DIAGNOSIS — Z8249 Family history of ischemic heart disease and other diseases of the circulatory system: Secondary | ICD-10-CM | POA: Diagnosis not present

## 2018-01-25 DIAGNOSIS — I2510 Atherosclerotic heart disease of native coronary artery without angina pectoris: Principal | ICD-10-CM | POA: Diagnosis present

## 2018-01-25 DIAGNOSIS — E785 Hyperlipidemia, unspecified: Secondary | ICD-10-CM | POA: Diagnosis present

## 2018-01-25 DIAGNOSIS — I471 Supraventricular tachycardia: Secondary | ICD-10-CM | POA: Diagnosis not present

## 2018-01-25 DIAGNOSIS — I454 Nonspecific intraventricular block: Secondary | ICD-10-CM | POA: Diagnosis present

## 2018-01-25 DIAGNOSIS — E8770 Fluid overload, unspecified: Secondary | ICD-10-CM | POA: Diagnosis present

## 2018-01-25 DIAGNOSIS — I5022 Chronic systolic (congestive) heart failure: Secondary | ICD-10-CM | POA: Diagnosis present

## 2018-01-25 DIAGNOSIS — Z951 Presence of aortocoronary bypass graft: Secondary | ICD-10-CM

## 2018-01-25 DIAGNOSIS — D62 Acute posthemorrhagic anemia: Secondary | ICD-10-CM | POA: Diagnosis not present

## 2018-01-25 DIAGNOSIS — J948 Other specified pleural conditions: Secondary | ICD-10-CM | POA: Diagnosis not present

## 2018-01-25 DIAGNOSIS — I739 Peripheral vascular disease, unspecified: Secondary | ICD-10-CM | POA: Diagnosis present

## 2018-01-25 DIAGNOSIS — Z823 Family history of stroke: Secondary | ICD-10-CM

## 2018-01-25 DIAGNOSIS — Z87891 Personal history of nicotine dependence: Secondary | ICD-10-CM

## 2018-01-25 DIAGNOSIS — R600 Localized edema: Secondary | ICD-10-CM | POA: Diagnosis not present

## 2018-01-25 DIAGNOSIS — Z9861 Coronary angioplasty status: Secondary | ICD-10-CM | POA: Diagnosis not present

## 2018-01-25 DIAGNOSIS — I252 Old myocardial infarction: Secondary | ICD-10-CM | POA: Diagnosis not present

## 2018-01-25 HISTORY — PX: TEE WITHOUT CARDIOVERSION: SHX5443

## 2018-01-25 HISTORY — DX: Presence of aortocoronary bypass graft: Z95.1

## 2018-01-25 HISTORY — PX: CORONARY ARTERY BYPASS GRAFT: SHX141

## 2018-01-25 LAB — POCT I-STAT 4, (NA,K, GLUC, HGB,HCT)
Glucose, Bld: 131 mg/dL — ABNORMAL HIGH (ref 65–99)
HCT: 28 % — ABNORMAL LOW (ref 39.0–52.0)
Hemoglobin: 9.5 g/dL — ABNORMAL LOW (ref 13.0–17.0)
Potassium: 4.8 mmol/L (ref 3.5–5.1)
Sodium: 138 mmol/L (ref 135–145)

## 2018-01-25 LAB — POCT I-STAT 3, ART BLOOD GAS (G3+)
Acid-base deficit: 2 mmol/L (ref 0.0–2.0)
Acid-base deficit: 1 mmol/L (ref 0.0–2.0)
Acid-base deficit: 3 mmol/L — ABNORMAL HIGH (ref 0.0–2.0)
Acid-base deficit: 2 mmol/L (ref 0.0–2.0)
Bicarbonate: 23.5 mmol/L (ref 20.0–28.0)
Bicarbonate: 22.3 mmol/L (ref 20.0–28.0)
Bicarbonate: 24.9 mmol/L (ref 20.0–28.0)
Bicarbonate: 24.9 mmol/L (ref 20.0–28.0)
O2 Saturation: 100 %
O2 Saturation: 99 %
O2 Saturation: 99 %
O2 Saturation: 99 %
Patient temperature: 36.6
Patient temperature: 36.8
Patient temperature: 37
Patient temperature: 37
TCO2: 25 mmol/L (ref 22–32)
TCO2: 24 mmol/L (ref 22–32)
TCO2: 26 mmol/L (ref 22–32)
TCO2: 26 mmol/L (ref 22–32)
pCO2 arterial: 43.7 mmHg (ref 32.0–48.0)
pCO2 arterial: 42.5 mmHg (ref 32.0–48.0)
pCO2 arterial: 46.6 mmHg (ref 32.0–48.0)
pCO2 arterial: 50.2 mmHg — ABNORMAL HIGH (ref 32.0–48.0)
pH, Arterial: 7.336 — ABNORMAL LOW (ref 7.350–7.450)
pH, Arterial: 7.329 — ABNORMAL LOW (ref 7.350–7.450)
pH, Arterial: 7.335 — ABNORMAL LOW (ref 7.350–7.450)
pH, Arterial: 7.304 — ABNORMAL LOW (ref 7.350–7.450)
pO2, Arterial: 191 mmHg — ABNORMAL HIGH (ref 83.0–108.0)
pO2, Arterial: 127 mmHg — ABNORMAL HIGH (ref 83.0–108.0)
pO2, Arterial: 136 mmHg — ABNORMAL HIGH (ref 83.0–108.0)
pO2, Arterial: 137 mmHg — ABNORMAL HIGH (ref 83.0–108.0)

## 2018-01-25 LAB — CBC
HCT: 30.1 % — ABNORMAL LOW (ref 39.0–52.0)
HCT: 27.4 % — ABNORMAL LOW (ref 39.0–52.0)
Hemoglobin: 9.9 g/dL — ABNORMAL LOW (ref 13.0–17.0)
Hemoglobin: 9.3 g/dL — ABNORMAL LOW (ref 13.0–17.0)
MCH: 30.9 pg (ref 26.0–34.0)
MCH: 31.7 pg (ref 26.0–34.0)
MCHC: 32.9 g/dL (ref 30.0–36.0)
MCHC: 33.9 g/dL (ref 30.0–36.0)
MCV: 94.1 fL (ref 78.0–100.0)
MCV: 93.5 fL (ref 78.0–100.0)
Platelets: 146 10*3/uL — ABNORMAL LOW (ref 150–400)
Platelets: 156 10*3/uL (ref 150–400)
RBC: 3.2 MIL/uL — ABNORMAL LOW (ref 4.22–5.81)
RBC: 2.93 MIL/uL — ABNORMAL LOW (ref 4.22–5.81)
RDW: 13.1 % (ref 11.5–15.5)
RDW: 13.1 % (ref 11.5–15.5)
WBC: 16 10*3/uL — AB (ref 4.0–10.5)
WBC: 16.9 10*3/uL — ABNORMAL HIGH (ref 4.0–10.5)

## 2018-01-25 LAB — CREATININE, SERUM
Creatinine, Ser: 1.2 mg/dL (ref 0.61–1.24)
GFR calc Af Amer: >60 mL/min (ref >60)
GFR calc non Af Amer: >60 mL/min (ref >60)

## 2018-01-25 LAB — GLUCOSE, CAPILLARY
Glucose-Capillary: 113 mg/dL — ABNORMAL HIGH (ref 65–99)
Glucose-Capillary: 145 mg/dL — ABNORMAL HIGH (ref 65–99)
Glucose-Capillary: 120 mg/dL — ABNORMAL HIGH (ref 65–99)
Glucose-Capillary: 122 mg/dL — ABNORMAL HIGH (ref 65–99)
Glucose-Capillary: 141 mg/dL — ABNORMAL HIGH (ref 65–99)
Glucose-Capillary: 132 mg/dL — ABNORMAL HIGH (ref 65–99)
Glucose-Capillary: 120 mg/dL — ABNORMAL HIGH (ref 65–99)
Glucose-Capillary: 100 mg/dL — ABNORMAL HIGH (ref 65–99)

## 2018-01-25 LAB — POCT I-STAT, CHEM 8
BUN: 19 mg/dL (ref 6–20)
Calcium, Ion: 1.18 mmol/L (ref 1.15–1.40)
Chloride: 104 mmol/L (ref 101–111)
Creatinine, Ser: 1.1 mg/dL (ref 0.61–1.24)
Glucose, Bld: 125 mg/dL — ABNORMAL HIGH (ref 65–99)
HCT: 23 % — ABNORMAL LOW (ref 39.0–52.0)
Hemoglobin: 7.8 g/dL — ABNORMAL LOW (ref 13.0–17.0)
Potassium: 4.9 mmol/L (ref 3.5–5.1)
Sodium: 137 mmol/L (ref 135–145)
TCO2: 25 mmol/L (ref 22–32)

## 2018-01-25 LAB — PROTIME-INR
INR: 1.37
PROTHROMBIN TIME: 16.7 s — AB (ref 11.4–15.2)

## 2018-01-25 LAB — HEMOGLOBIN AND HEMATOCRIT, BLOOD
HCT: 24.6 % — ABNORMAL LOW (ref 39.0–52.0)
Hemoglobin: 8.2 g/dL — ABNORMAL LOW (ref 13.0–17.0)

## 2018-01-25 LAB — PREPARE RBC (CROSSMATCH)

## 2018-01-25 LAB — PLATELET COUNT: Platelets: 134 10*3/uL — ABNORMAL LOW (ref 150–400)

## 2018-01-25 LAB — MAGNESIUM: Magnesium: 3.2 mg/dL — ABNORMAL HIGH (ref 1.7–2.4)

## 2018-01-25 LAB — APTT: APTT: 27 s (ref 24–36)

## 2018-01-25 SURGERY — CORONARY ARTERY BYPASS GRAFTING (CABG)
Anesthesia: General | Site: Chest

## 2018-01-25 MED ORDER — SODIUM CHLORIDE 0.45 % IV SOLN: INTRAVENOUS | Status: DC | PRN

## 2018-01-25 MED ORDER — ONDANSETRON HCL 4 MG/2ML IJ SOLN
4.0000 mg | Freq: Four times a day (QID) | INTRAMUSCULAR | Status: DC | PRN
  Administered 2018-01-25: 4 mg via INTRAVENOUS
  Filled 2018-01-25: qty 2

## 2018-01-25 MED ORDER — LACTATED RINGERS IV SOLN: INTRAVENOUS | Status: DC

## 2018-01-25 MED ORDER — TRAMADOL HCL 50 MG PO TABS
50.0000 mg | ORAL_TABLET | ORAL | Status: DC | PRN
  Administered 2018-01-31: 100 mg via ORAL
  Filled 2018-01-25: qty 2

## 2018-01-25 MED ORDER — ROCURONIUM BROMIDE 10 MG/ML (PF) SYRINGE: PREFILLED_SYRINGE | INTRAVENOUS | Status: DC | PRN

## 2018-01-25 MED ORDER — SODIUM CHLORIDE 0.9 % IV SOLN
INTRAVENOUS | Status: AC
  Filled 2018-01-25 (×2): qty 1

## 2018-01-25 MED ORDER — ACETAMINOPHEN 160 MG/5ML PO SOLN: 650.0000 mg | Freq: Once | ORAL | Status: AC

## 2018-01-25 MED ORDER — SODIUM CHLORIDE 0.9 % IV SOLN
INTRAVENOUS | Status: DC
  Administered 2018-01-25: 14:00:00 via INTRAVENOUS

## 2018-01-25 MED ORDER — ASPIRIN 81 MG PO CHEW
324.0000 mg | CHEWABLE_TABLET | Freq: Every day | ORAL | Status: DC
  Filled 2018-01-25: qty 4

## 2018-01-25 MED ORDER — EPINEPHRINE PF 1 MG/ML IJ SOLN
2.0000 ug/min | INTRAVENOUS | Status: DC
  Administered 2018-01-26: 2 ug/min via INTRAVENOUS
  Filled 2018-01-25 (×2): qty 4

## 2018-01-25 MED ORDER — OXYCODONE HCL 5 MG PO TABS
5.0000 mg | ORAL_TABLET | ORAL | Status: DC | PRN
  Administered 2018-01-25 – 2018-01-26 (×2): 10 mg via ORAL
  Administered 2018-01-27 – 2018-01-29 (×2): 5 mg via ORAL
  Administered 2018-01-31: 10 mg via ORAL
  Filled 2018-01-25: qty 2
  Filled 2018-01-25: qty 1
  Filled 2018-01-25 (×2): qty 2
  Filled 2018-01-25: qty 1
  Filled 2018-01-25: qty 2

## 2018-01-25 MED ORDER — ATORVASTATIN CALCIUM 80 MG PO TABS
80.0000 mg | ORAL_TABLET | Freq: Every evening | ORAL | Status: DC
  Administered 2018-01-26 – 2018-01-30 (×5): 80 mg via ORAL
  Filled 2018-01-25 (×5): qty 1

## 2018-01-25 MED ORDER — PANTOPRAZOLE SODIUM 40 MG PO TBEC
40.0000 mg | DELAYED_RELEASE_TABLET | Freq: Every day | ORAL | Status: DC
  Administered 2018-01-27 – 2018-01-31 (×5): 40 mg via ORAL
  Filled 2018-01-25 (×5): qty 1

## 2018-01-25 MED ORDER — MORPHINE SULFATE (PF) 2 MG/ML IV SOLN: 1.0000 mg | INTRAVENOUS | Status: DC | PRN

## 2018-01-25 MED ORDER — LACTATED RINGERS IV SOLN
INTRAVENOUS | Status: DC | PRN
  Administered 2018-01-25: 08:00:00 via INTRAVENOUS

## 2018-01-25 MED ORDER — PROTAMINE SULFATE 10 MG/ML IV SOLN
INTRAVENOUS | Status: DC | PRN
  Administered 2018-01-25: 260 mg via INTRAVENOUS

## 2018-01-25 MED ORDER — METOPROLOL TARTRATE 5 MG/5ML IV SOLN
2.5000 mg | INTRAVENOUS | Status: DC | PRN
  Administered 2018-01-28: 5 mg via INTRAVENOUS
  Filled 2018-01-25: qty 5

## 2018-01-25 MED ORDER — ACETAMINOPHEN 160 MG/5ML PO SOLN: 1000.0000 mg | Freq: Four times a day (QID) | ORAL | Status: AC

## 2018-01-25 MED ORDER — ACETAMINOPHEN 650 MG RE SUPP
650.0000 mg | Freq: Once | RECTAL | Status: AC
  Administered 2018-01-25: 650 mg via RECTAL

## 2018-01-25 MED ORDER — NOREPINEPHRINE BITARTRATE 1 MG/ML IV SOLN
0.0000 ug/min | INTRAVENOUS | Status: DC
  Administered 2018-01-25: 2 ug/min via INTRAVENOUS
  Filled 2018-01-25: qty 4

## 2018-01-25 MED ORDER — SODIUM CHLORIDE 0.9 % IV SOLN
0.0000 ug/kg/h | INTRAVENOUS | Status: DC
  Administered 2018-01-25: 0.7 ug/kg/h via INTRAVENOUS
  Filled 2018-01-25 (×2): qty 2

## 2018-01-25 MED ORDER — CHLORHEXIDINE GLUCONATE 0.12 % MT SOLN
15.0000 mL | OROMUCOSAL | Status: AC
  Administered 2018-01-25: 15 mL via OROMUCOSAL

## 2018-01-25 MED ORDER — MIDAZOLAM HCL 2 MG/2ML IJ SOLN: 2.0000 mg | INTRAMUSCULAR | Status: DC | PRN

## 2018-01-25 MED ORDER — HEMOSTATIC AGENTS (NO CHARGE) OPTIME
TOPICAL | Status: DC | PRN
  Administered 2018-01-25 (×2): 1 via TOPICAL

## 2018-01-25 MED ORDER — BISACODYL 5 MG PO TBEC
10.0000 mg | DELAYED_RELEASE_TABLET | Freq: Every day | ORAL | Status: DC
  Administered 2018-01-26 – 2018-01-30 (×4): 10 mg via ORAL
  Filled 2018-01-25 (×6): qty 2

## 2018-01-25 MED ORDER — ROCURONIUM BROMIDE 100 MG/10ML IV SOLN
INTRAVENOUS | Status: DC | PRN
  Administered 2018-01-25 (×2): 50 mg via INTRAVENOUS
  Administered 2018-01-25: 70 mg via INTRAVENOUS
  Administered 2018-01-25: 30 mg via INTRAVENOUS
  Administered 2018-01-25: 50 mg via INTRAVENOUS

## 2018-01-25 MED ORDER — METOPROLOL TARTRATE 12.5 MG HALF TABLET: 12.5000 mg | ORAL_TABLET | Freq: Once | ORAL | Status: DC

## 2018-01-25 MED ORDER — INSULIN REGULAR BOLUS VIA INFUSION
0.0000 [IU] | Freq: Three times a day (TID) | INTRAVENOUS | Status: DC
  Filled 2018-01-25: qty 10

## 2018-01-25 MED ORDER — VANCOMYCIN HCL IN DEXTROSE 1-5 GM/200ML-% IV SOLN
1000.0000 mg | Freq: Once | INTRAVENOUS | Status: AC
  Administered 2018-01-25: 1000 mg via INTRAVENOUS
  Filled 2018-01-25: qty 200

## 2018-01-25 MED ORDER — METOPROLOL TARTRATE 25 MG/10 ML ORAL SUSPENSION: 12.5000 mg | Freq: Two times a day (BID) | ORAL | Status: DC

## 2018-01-25 MED ORDER — SODIUM CHLORIDE 0.9% FLUSH: 3.0000 mL | INTRAVENOUS | Status: DC | PRN

## 2018-01-25 MED ORDER — FENTANYL CITRATE (PF) 250 MCG/5ML IJ SOLN
INTRAMUSCULAR | Status: DC | PRN
  Administered 2018-01-25: 50 ug via INTRAVENOUS
  Administered 2018-01-25: 100 ug via INTRAVENOUS
  Administered 2018-01-25: 150 ug via INTRAVENOUS
  Administered 2018-01-25: 650 ug via INTRAVENOUS
  Administered 2018-01-25 (×2): 50 ug via INTRAVENOUS

## 2018-01-25 MED ORDER — POTASSIUM CHLORIDE 10 MEQ/50ML IV SOLN: 10.0000 meq | INTRAVENOUS | Status: AC

## 2018-01-25 MED ORDER — CHLORHEXIDINE GLUCONATE 0.12% ORAL RINSE (MEDLINE KIT)
15.0000 mL | Freq: Two times a day (BID) | OROMUCOSAL | Status: DC
  Administered 2018-01-25: 15 mL via OROMUCOSAL

## 2018-01-25 MED ORDER — CHLORHEXIDINE GLUCONATE 0.12 % MT SOLN
OROMUCOSAL | Status: AC
  Administered 2018-01-25: 15 mL via OROMUCOSAL
  Filled 2018-01-25: qty 15

## 2018-01-25 MED ORDER — MORPHINE SULFATE (PF) 2 MG/ML IV SOLN: 2.0000 mg | INTRAVENOUS | Status: DC | PRN

## 2018-01-25 MED ORDER — CHLORHEXIDINE GLUCONATE 0.12 % MT SOLN
15.0000 mL | Freq: Once | OROMUCOSAL | Status: AC
  Administered 2018-01-25: 15 mL via OROMUCOSAL

## 2018-01-25 MED ORDER — CHLORHEXIDINE GLUCONATE 4 % EX LIQD: 30.0000 mL | CUTANEOUS | Status: DC

## 2018-01-25 MED ORDER — DEXTROSE 5 % IV SOLN
INTRAVENOUS | Status: DC | PRN
  Administered 2018-01-25: 2 ug/min via INTRAVENOUS

## 2018-01-25 MED ORDER — PHENYLEPHRINE HCL 10 MG/ML IJ SOLN
INTRAMUSCULAR | Status: DC | PRN
  Administered 2018-01-25: 80 ug via INTRAVENOUS
  Administered 2018-01-25 (×4): 120 ug via INTRAVENOUS
  Administered 2018-01-25: 80 ug via INTRAVENOUS

## 2018-01-25 MED ORDER — ORAL CARE MOUTH RINSE
15.0000 mL | OROMUCOSAL | Status: DC
  Administered 2018-01-25 (×2): 15 mL via OROMUCOSAL

## 2018-01-25 MED ORDER — PLASMA-LYTE 148 IV SOLN
INTRAVENOUS | Status: DC | PRN
  Administered 2018-01-25: 500 mL via INTRAVASCULAR

## 2018-01-25 MED ORDER — FAMOTIDINE 20 MG IN NS 100 ML IVPB
20.0000 mg | Freq: Two times a day (BID) | INTRAVENOUS | Status: DC
  Filled 2018-01-25: qty 100

## 2018-01-25 MED ORDER — MAGNESIUM SULFATE 4 GM/100ML IV SOLN
4.0000 g | Freq: Once | INTRAVENOUS | Status: AC
  Administered 2018-01-25: 4 g via INTRAVENOUS
  Filled 2018-01-25: qty 100

## 2018-01-25 MED ORDER — 0.9 % SODIUM CHLORIDE (POUR BTL) OPTIME
TOPICAL | Status: DC | PRN
Start: 2018-01-25 — End: 2018-01-25
  Administered 2018-01-25: 5000 mL

## 2018-01-25 MED ORDER — ASPIRIN EC 325 MG PO TBEC
325.0000 mg | DELAYED_RELEASE_TABLET | Freq: Every day | ORAL | Status: DC
  Administered 2018-01-26 – 2018-01-31 (×6): 325 mg via ORAL
  Filled 2018-01-25 (×6): qty 1

## 2018-01-25 MED ORDER — SODIUM CHLORIDE 0.9 % IV SOLN
0.0000 ug/min | INTRAVENOUS | Status: DC
  Administered 2018-01-25: 70 ug/min via INTRAVENOUS
  Administered 2018-01-25: 80 ug/min via INTRAVENOUS
  Filled 2018-01-25 (×3): qty 2

## 2018-01-25 MED ORDER — PHENYLEPHRINE HCL 10 MG/ML IJ SOLN: INTRAVENOUS | Status: DC | PRN

## 2018-01-25 MED ORDER — PROPOFOL 10 MG/ML IV BOLUS
INTRAVENOUS | Status: DC | PRN
  Administered 2018-01-25: 50 mg via INTRAVENOUS

## 2018-01-25 MED ORDER — LACTATED RINGERS IV SOLN
INTRAVENOUS | Status: DC | PRN
  Administered 2018-01-25 (×2): via INTRAVENOUS

## 2018-01-25 MED ORDER — SODIUM CHLORIDE 0.9 % IV SOLN
250.0000 mL | INTRAVENOUS | Status: DC
  Administered 2018-01-26: 250 mL via INTRAVENOUS

## 2018-01-25 MED ORDER — MIDAZOLAM HCL 10 MG/2ML IJ SOLN
INTRAMUSCULAR | Status: AC
  Filled 2018-01-25: qty 2

## 2018-01-25 MED ORDER — SODIUM CHLORIDE 0.9% FLUSH
3.0000 mL | Freq: Two times a day (BID) | INTRAVENOUS | Status: DC
  Administered 2018-01-26 – 2018-01-31 (×5): 3 mL via INTRAVENOUS

## 2018-01-25 MED ORDER — HEPARIN SODIUM (PORCINE) 1000 UNIT/ML IJ SOLN
INTRAMUSCULAR | Status: DC | PRN
  Administered 2018-01-25: 3000 [IU] via INTRAVENOUS
  Administered 2018-01-25: 23000 [IU] via INTRAVENOUS

## 2018-01-25 MED ORDER — DOCUSATE SODIUM 100 MG PO CAPS
200.0000 mg | ORAL_CAPSULE | Freq: Every day | ORAL | Status: DC
  Administered 2018-01-26 – 2018-01-31 (×6): 200 mg via ORAL
  Filled 2018-01-25 (×6): qty 2

## 2018-01-25 MED ORDER — CEFUROXIME SODIUM 1.5 G IV SOLR
1.5000 g | Freq: Two times a day (BID) | INTRAVENOUS | Status: DC
  Administered 2018-01-25 – 2018-01-26 (×3): 1.5 g via INTRAVENOUS
  Filled 2018-01-25 (×4): qty 1.5

## 2018-01-25 MED ORDER — MILRINONE LACTATE IN DEXTROSE 20-5 MG/100ML-% IV SOLN
0.1250 ug/kg/min | INTRAVENOUS | Status: AC
  Administered 2018-01-25: 0.25 ug/kg/min via INTRAVENOUS
  Filled 2018-01-25: qty 100

## 2018-01-25 MED ORDER — ACETAMINOPHEN 500 MG PO TABS
1000.0000 mg | ORAL_TABLET | Freq: Four times a day (QID) | ORAL | Status: AC
  Administered 2018-01-26 – 2018-01-30 (×20): 1000 mg via ORAL
  Filled 2018-01-25 (×19): qty 2

## 2018-01-25 MED ORDER — ORAL CARE MOUTH RINSE
15.0000 mL | Freq: Two times a day (BID) | OROMUCOSAL | Status: DC
  Administered 2018-01-26 – 2018-01-30 (×6): 15 mL via OROMUCOSAL

## 2018-01-25 MED ORDER — FENTANYL CITRATE (PF) 250 MCG/5ML IJ SOLN
INTRAMUSCULAR | Status: AC
  Filled 2018-01-25: qty 25

## 2018-01-25 MED ORDER — MIDAZOLAM HCL 5 MG/5ML IJ SOLN
INTRAMUSCULAR | Status: DC | PRN
  Administered 2018-01-25: 2 mg via INTRAVENOUS
  Administered 2018-01-25: 3 mg via INTRAVENOUS
  Administered 2018-01-25: 2 mg via INTRAVENOUS

## 2018-01-25 MED ORDER — NITROGLYCERIN IN D5W 200-5 MCG/ML-% IV SOLN: 0.0000 ug/min | INTRAVENOUS | Status: DC

## 2018-01-25 MED ORDER — ROCURONIUM BROMIDE 10 MG/ML (PF) SYRINGE
PREFILLED_SYRINGE | INTRAVENOUS | Status: AC
  Filled 2018-01-25: qty 15

## 2018-01-25 MED ORDER — GELATIN ABSORBABLE MT POWD
OROMUCOSAL | Status: DC | PRN
  Administered 2018-01-25 (×3): 4 mL via TOPICAL

## 2018-01-25 MED ORDER — BISACODYL 10 MG RE SUPP
10.0000 mg | Freq: Every day | RECTAL | Status: DC
  Filled 2018-01-25: qty 1

## 2018-01-25 MED ORDER — MILRINONE LACTATE IN DEXTROSE 20-5 MG/100ML-% IV SOLN
0.1250 ug/kg/min | INTRAVENOUS | Status: DC
  Administered 2018-01-25: 0.25 ug/kg/min via INTRAVENOUS
  Administered 2018-01-26: 0.125 ug/kg/min via INTRAVENOUS
  Filled 2018-01-25 (×2): qty 100

## 2018-01-25 MED ORDER — ALBUMIN HUMAN 5 % IV SOLN
250.0000 mL | INTRAVENOUS | Status: AC | PRN
  Administered 2018-01-25 (×2): 250 mL via INTRAVENOUS

## 2018-01-25 MED ORDER — METOPROLOL TARTRATE 12.5 MG HALF TABLET
12.5000 mg | ORAL_TABLET | Freq: Two times a day (BID) | ORAL | Status: DC
  Administered 2018-01-26: 12.5 mg via ORAL
  Filled 2018-01-25: qty 1

## 2018-01-25 MED ORDER — LACTATED RINGERS IV SOLN: 500.0000 mL | Freq: Once | INTRAVENOUS | Status: DC | PRN

## 2018-01-25 MED ORDER — PROPOFOL 10 MG/ML IV BOLUS
INTRAVENOUS | Status: AC
  Filled 2018-01-25: qty 20

## 2018-01-25 MED ORDER — PHENYLEPHRINE HCL 10 MG/ML IJ SOLN
INTRAMUSCULAR | Status: DC | PRN
  Administered 2018-01-25: 50 ug/min via INTRAVENOUS

## 2018-01-25 SURGICAL SUPPLY — 97 items
ADAPTER CARDIO PERF ANTE/RETRO (ADAPTER) ×4 IMPLANT
BAG DECANTER FOR FLEXI CONT (MISCELLANEOUS) ×4 IMPLANT
BANDAGE ACE 4X5 VEL STRL LF (GAUZE/BANDAGES/DRESSINGS) ×4 IMPLANT
BANDAGE ACE 6X5 VEL STRL LF (GAUZE/BANDAGES/DRESSINGS) ×4 IMPLANT
BASKET HEART  (ORDER IN 25'S) (MISCELLANEOUS) ×1
BASKET HEART (ORDER IN 25'S) (MISCELLANEOUS) ×1
BASKET HEART (ORDER IN 25S) (MISCELLANEOUS) ×2 IMPLANT
BLADE CLIPPER SURG (BLADE) ×4 IMPLANT
BLADE STERNUM SYSTEM 6 (BLADE) ×4 IMPLANT
BLADE SURG 10 STRL SS (BLADE) ×4 IMPLANT
BLADE SURG 12 STRL SS (BLADE) ×4 IMPLANT
BNDG GAUZE ELAST 4 BULKY (GAUZE/BANDAGES/DRESSINGS) ×4 IMPLANT
CANISTER SUCT 3000ML PPV (MISCELLANEOUS) ×4 IMPLANT
CANNULA GUNDRY RCSP 15FR (MISCELLANEOUS) ×4 IMPLANT
CATH CPB KIT VANTRIGT (MISCELLANEOUS) ×4 IMPLANT
CATH ROBINSON RED A/P 18FR (CATHETERS) ×12 IMPLANT
CATH THORACIC 36FR RT ANG (CATHETERS) ×4 IMPLANT
CLIP FOGARTY SPRING 6M (CLIP) ×4 IMPLANT
CLIP VESOCCLUDE SM WIDE 24/CT (CLIP) ×4 IMPLANT
CRADLE DONUT ADULT HEAD (MISCELLANEOUS) ×4 IMPLANT
DRAIN CHANNEL 32F RND 10.7 FF (WOUND CARE) ×4 IMPLANT
DRAPE CARDIOVASCULAR INCISE (DRAPES) ×2
DRAPE SLUSH/WARMER DISC (DRAPES) ×4 IMPLANT
DRAPE SRG 135X102X78XABS (DRAPES) ×2 IMPLANT
DRSG AQUACEL AG ADV 3.5X14 (GAUZE/BANDAGES/DRESSINGS) ×4 IMPLANT
ELECT BLADE 4.0 EZ CLEAN MEGAD (MISCELLANEOUS) ×4
ELECT BLADE 6.5 EXT (BLADE) ×4 IMPLANT
ELECT CAUTERY BLADE 6.4 (BLADE) ×4 IMPLANT
ELECT REM PT RETURN 9FT ADLT (ELECTROSURGICAL) ×8
ELECTRODE BLDE 4.0 EZ CLN MEGD (MISCELLANEOUS) ×2 IMPLANT
ELECTRODE REM PT RTRN 9FT ADLT (ELECTROSURGICAL) ×4 IMPLANT
FELT TEFLON 1X6 (MISCELLANEOUS) ×4 IMPLANT
GAUZE SPONGE 4X4 12PLY STRL (GAUZE/BANDAGES/DRESSINGS) ×8 IMPLANT
GLOVE BIO SURGEON STRL SZ7.5 (GLOVE) ×20 IMPLANT
GOWN STRL REUS W/ TWL LRG LVL3 (GOWN DISPOSABLE) ×16 IMPLANT
GOWN STRL REUS W/TWL LRG LVL3 (GOWN DISPOSABLE) ×16
HEMOSTAT POWDER SURGIFOAM 1G (HEMOSTASIS) ×12 IMPLANT
HEMOSTAT SURGICEL 2X14 (HEMOSTASIS) ×4 IMPLANT
INSERT FOGARTY XLG (MISCELLANEOUS) IMPLANT
KIT BASIN OR (CUSTOM PROCEDURE TRAY) ×4 IMPLANT
KIT ROOM TURNOVER OR (KITS) ×4 IMPLANT
KIT SUCTION CATH 14FR (SUCTIONS) ×4 IMPLANT
KIT VASOVIEW HEMOPRO VH 3000 (KITS) ×4 IMPLANT
LEAD PACING MYOCARDI (MISCELLANEOUS) ×4 IMPLANT
MARKER GRAFT CORONARY BYPASS (MISCELLANEOUS) ×12 IMPLANT
NS IRRIG 1000ML POUR BTL (IV SOLUTION) ×20 IMPLANT
PACK E OPEN HEART (SUTURE) ×4 IMPLANT
PACK OPEN HEART (CUSTOM PROCEDURE TRAY) ×4 IMPLANT
PAD ARMBOARD 7.5X6 YLW CONV (MISCELLANEOUS) ×8 IMPLANT
PAD ELECT DEFIB RADIOL ZOLL (MISCELLANEOUS) ×4 IMPLANT
PENCIL BUTTON HOLSTER BLD 10FT (ELECTRODE) ×4 IMPLANT
PUNCH AORTIC ROT 4.0MM RCL 40 (MISCELLANEOUS) ×4 IMPLANT
PUNCH AORTIC ROTATE 4.0MM (MISCELLANEOUS) IMPLANT
PUNCH AORTIC ROTATE 4.5MM 8IN (MISCELLANEOUS) IMPLANT
PUNCH AORTIC ROTATE 5MM 8IN (MISCELLANEOUS) IMPLANT
SET CARDIOPLEGIA MPS 5001102 (MISCELLANEOUS) ×4 IMPLANT
SPONGE LAP 18X18 5 PK (GAUZE/BANDAGES/DRESSINGS) ×4 IMPLANT
SPONGE LAP 4X18 X RAY DECT (DISPOSABLE) ×4 IMPLANT
STOPCOCK 4 WAY LG BORE MALE ST (IV SETS) ×4 IMPLANT
SURGIFLO W/THROMBIN 8M KIT (HEMOSTASIS) ×4 IMPLANT
SUT BONE WAX W31G (SUTURE) ×4 IMPLANT
SUT MNCRL AB 4-0 PS2 18 (SUTURE) IMPLANT
SUT PROLENE 3 0 SH DA (SUTURE) IMPLANT
SUT PROLENE 3 0 SH1 36 (SUTURE) IMPLANT
SUT PROLENE 4 0 RB 1 (SUTURE) ×6
SUT PROLENE 4 0 SH DA (SUTURE) ×4 IMPLANT
SUT PROLENE 4-0 RB1 .5 CRCL 36 (SUTURE) ×6 IMPLANT
SUT PROLENE 5 0 C 1 36 (SUTURE) ×8 IMPLANT
SUT PROLENE 6 0 C 1 30 (SUTURE) IMPLANT
SUT PROLENE 6 0 CC (SUTURE) ×16 IMPLANT
SUT PROLENE 8 0 BV175 6 (SUTURE) ×24 IMPLANT
SUT PROLENE BLUE 7 0 (SUTURE) ×8 IMPLANT
SUT PROLENE POLY MONO (SUTURE) ×8 IMPLANT
SUT SILK  1 MH (SUTURE)
SUT SILK 1 MH (SUTURE) IMPLANT
SUT SILK 2 0 SH CR/8 (SUTURE) ×4 IMPLANT
SUT SILK 3 0 SH CR/8 (SUTURE) IMPLANT
SUT SILK 4 0 TIE 10X30 (SUTURE) ×4 IMPLANT
SUT STEEL 6MS V (SUTURE) ×8 IMPLANT
SUT STEEL SZ 6 DBL 3X14 BALL (SUTURE) ×4 IMPLANT
SUT VIC AB 1 CTX 36 (SUTURE) ×4
SUT VIC AB 1 CTX36XBRD ANBCTR (SUTURE) ×4 IMPLANT
SUT VIC AB 2-0 CT1 27 (SUTURE) ×2
SUT VIC AB 2-0 CT1 TAPERPNT 27 (SUTURE) ×2 IMPLANT
SUT VIC AB 2-0 CTX 27 (SUTURE) IMPLANT
SUT VIC AB 3-0 X1 27 (SUTURE) ×8 IMPLANT
SYSTEM SAHARA CHEST DRAIN ATS (WOUND CARE) ×4 IMPLANT
TAPE CLOTH SURG 4X10 WHT LF (GAUZE/BANDAGES/DRESSINGS) ×4 IMPLANT
TAPE PAPER 2X10 WHT MICROPORE (GAUZE/BANDAGES/DRESSINGS) ×4 IMPLANT
TOWEL GREEN STERILE (TOWEL DISPOSABLE) ×4 IMPLANT
TOWEL GREEN STERILE FF (TOWEL DISPOSABLE) ×4 IMPLANT
TRAY CATH LUMEN 1 20CM STRL (SET/KITS/TRAYS/PACK) ×4 IMPLANT
TRAY FOLEY SILVER 16FR TEMP (SET/KITS/TRAYS/PACK) ×4 IMPLANT
TUBING ART PRESS 48 MALE/FEM (TUBING) ×8 IMPLANT
TUBING INSUFFLATION (TUBING) ×4 IMPLANT
UNDERPAD 30X30 (UNDERPADS AND DIAPERS) ×4 IMPLANT
WATER STERILE IRR 1000ML POUR (IV SOLUTION) ×8 IMPLANT

## 2018-01-25 NOTE — OR Nursing (Signed)
Twenty minute call to SICU charge nurse at 1250. Spoke to [place]. Cath Lab also notified of timing.

## 2018-01-25 NOTE — Anesthesia Postprocedure Evaluation (Signed)
Anesthesia Post Note  Patient: Lance Ayala  Procedure(s) Performed: CORONARY ARTERY BYPASS GRAFTING (CABG), ON PUMP, TIMES FOUR, USING LEFT INTERNAL MAMMARY ARTERY AND ENDOSCOPICALLY HARVESTED RIGHT GREATER SAPHENOUS VEIN (N/A Chest) TRANSESOPHAGEAL ECHOCARDIOGRAM (TEE) (N/A )     Patient location during evaluation: SICU Anesthesia Type: General Level of consciousness: sedated Pain management: pain level controlled Vital Signs Assessment: post-procedure vital signs reviewed and stable Respiratory status: patient remains intubated per anesthesia plan Cardiovascular status: stable Postop Assessment: no apparent nausea or vomiting Anesthetic complications: no    Last Vitals:  Vitals:   01/25/18 0552 01/25/18 1335  BP: (!) 142/65 (!) 120/57  Pulse: 69 90  Resp: 18 12  Temp: 36.6 C   SpO2: 99% 100%    Last Pain:  Vitals:   01/25/18 0552  TempSrc: Oral                 Essica Kiker,W. EDMOND

## 2018-01-25 NOTE — Transfer of Care (Signed)
Immediate Anesthesia Transfer of Care Note  Patient: Lance Ayala  Procedure(s) Performed: CORONARY ARTERY BYPASS GRAFTING (CABG), ON PUMP, TIMES FOUR, USING LEFT INTERNAL MAMMARY ARTERY AND ENDOSCOPICALLY HARVESTED RIGHT GREATER SAPHENOUS VEIN (N/A Chest) TRANSESOPHAGEAL ECHOCARDIOGRAM (TEE) (N/A )  Patient Location: ICU  Anesthesia Type:General  Level of Consciousness: sedated and Patient remains intubated per anesthesia plan  Airway & Oxygen Therapy: Patient remains intubated per anesthesia plan and Patient placed on Ventilator (see vital sign flow sheet for setting)  Post-op Assessment: Report given to RN and Post -op Vital signs reviewed and stable  Post vital signs: Reviewed and stable  Last Vitals:  Vitals:   01/25/18 0552 01/25/18 1335  BP: (!) 142/65 (!) 120/57  Pulse: 69 90  Resp: 18 12  Temp: 36.6 C   SpO2: 99% 100%    Last Pain:  Vitals:   01/25/18 0552  TempSrc: Oral      Patients Stated Pain Goal: 2 (01/25/18 5110)  Complications: No apparent anesthesia complications

## 2018-01-25 NOTE — Progress Notes (Signed)
Echocardiogram Echocardiogram Transesophageal has been performed.  Pieter Partridge 01/25/2018, 9:06 AM

## 2018-01-25 NOTE — Anesthesia Procedure Notes (Signed)
Arterial Line Insertion Start/End2/25/2019 7:00 AM, 01/25/2018 7:05 AM Performed by: Adonis Housekeeper, CRNA  Patient location: Pre-op. Preanesthetic checklist: patient identified, IV checked, site marked, risks and benefits discussed, surgical consent, monitors and equipment checked, pre-op evaluation, timeout performed and anesthesia consent Lidocaine 1% used for infiltration and patient sedated Right, radial was placed Catheter size: 20 G Hand hygiene performed  and maximum sterile barriers used  Allen's test indicative of satisfactory collateral circulation Attempts: 1 Procedure performed without using ultrasound guided technique. Following insertion, Biopatch and dressing applied. Post procedure assessment: normal

## 2018-01-25 NOTE — Progress Notes (Signed)
      [address]       [place] [address]             [phone]      S/p CABG x 4  Extubated, no complaints  BP (!) 83/54   Pulse 89   Temp 98.8 F (37.1 C)   Resp 20   Ht 5\' 6"  (1.676 m)   Wt 160 lb (72.6 kg)   SpO2 100%   BMI 25.82 kg/m   Intake/Output Summary (Last 24 hours) at 01/25/2018 1851 Last data filed at 01/25/2018 1826 Gross per 24 hour  Intake 3365.39 ml  Output 1570 ml  Net 1795.39 ml  CI= 2.8 Hct= 23, K=4.9  Doing well early postop  Viviann Spare C. Dorris Fetch, MD [facility] [phone]

## 2018-01-25 NOTE — Anesthesia Procedure Notes (Signed)
Procedures

## 2018-01-25 NOTE — OR Nursing (Signed)
Forty-five minute call to SICU charge nurse at 1218. Spoke to [place].

## 2018-01-25 NOTE — Anesthesia Procedure Notes (Signed)
Central Venous Catheter Insertion Performed by: Murvin Natal, MD, anesthesiologist Start/End2/25/2019 6:50 AM, 01/25/2018 7:05 AM Patient location: Pre-op. Preanesthetic checklist: patient identified, IV checked, site marked, risks and benefits discussed, surgical consent, monitors and equipment checked, pre-op evaluation, timeout performed and anesthesia consent Position: Trendelenburg Lidocaine 1% used for infiltration and patient sedated Hand hygiene performed  and maximum sterile barriers used  Catheter size: 9 Fr Total catheter length 12. PA cath was placed.MAC introducer Swan type:thermodilution PA Cath depth:46 Procedure performed using ultrasound guided technique. Ultrasound Notes:anatomy identified, needle tip was noted to be adjacent to the nerve/plexus identified, no ultrasound evidence of intravascular and/or intraneural injection and image(s) printed for medical record Attempts: 2 Following insertion, line sutured, dressing applied and Biopatch. Post procedure assessment: blood return through all ports, free fluid flow and no air  Patient tolerated the procedure well with no immediate complications.

## 2018-01-25 NOTE — Anesthesia Procedure Notes (Signed)
Procedure Name: Intubation Date/Time: 01/25/2018 7:52 AM Performed by: Shirlyn Goltz, CRNA Pre-anesthesia Checklist: Patient identified, Emergency Drugs available, Suction available and Patient being monitored Patient Re-evaluated:Patient Re-evaluated prior to induction Oxygen Delivery Method: Circle system utilized Preoxygenation: Pre-oxygenation with 100% oxygen Induction Type: IV induction Ventilation: Mask ventilation without difficulty Laryngoscope Size: Mac and 3 Grade View: Grade I Tube type: Oral Tube size: 8.0 mm Number of attempts: 1 Airway Equipment and Method: Stylet Placement Confirmation: ETT inserted through vocal cords under direct vision,  positive ETCO2 and breath sounds checked- equal and bilateral Secured at: 20 cm Tube secured with: Tape Dental Injury: Teeth and Oropharynx as per pre-operative assessment

## 2018-01-25 NOTE — Brief Op Note (Signed)
01/25/2018  11:29 AM  PATIENT:  Lance Ayala  62 y.o. male  PRE-OPERATIVE DIAGNOSIS:  CAD  POST-OPERATIVE DIAGNOSIS:  CAD  PROCEDURE:  Procedure(s): CORONARY ARTERY BYPASS GRAFTING (CABG), ON PUMP, TIMES FOUR, USING LEFT INTERNAL MAMMARY ARTERY AND ENDOSCOPICALLY HARVESTED RIGHT GREATER SAPHENOUS VEIN (N/A) TRANSESOPHAGEAL ECHOCARDIOGRAM (TEE) (N/A)  SURGEON:  Surgeon(s) and Role:    Kerin Perna, MD - Primary  PHYSICIAN ASSISTANT: Ruairi Stutsman PA-C  ANESTHESIA:   general  EBL:  See anesthesia and perfusion records  BLOOD ADMINISTERED:none  DRAINS: CHEST TUBES IN PLEURAL AND MEDIASTINAL SPACE   LOCAL MEDICATIONS USED:  NONE  SPECIMEN:  No Specimen  DISPOSITION OF SPECIMEN:  N/A  COUNTS:  YES  TOURNIQUET:  * No tourniquets in log *  DICTATION: .Other Dictation: Dictation Number PENDING  PLAN OF CARE: Admit to inpatient   PATIENT DISPOSITION:  ICU - intubated and hemodynamically stable.   Delay start of Pharmacological VTE agent (>24hrs) due to surgical blood loss or risk of bleeding: yes  COMPLICATIONS: NO KNOWN

## 2018-01-25 NOTE — Progress Notes (Signed)
Pre Procedure note for inpatients:   Lance Ayala has been scheduled for Procedure(s): CORONARY ARTERY BYPASS GRAFTING (CABG) (N/A) TRANSESOPHAGEAL ECHOCARDIOGRAM (TEE) (N/A) today. The various methods of treatment have been discussed with the patient. After consideration of the risks, benefits and treatment options the patient has consented to the planned procedure.   The patient has been seen and labs reviewed. There are no changes in the patient's condition to prevent proceeding with the planned procedure today.  Recent labs:  Lab Results  Component Value Date   WBC 9.7 01/21/2018   HGB 12.9 (L) 01/21/2018   HCT 38.7 (L) 01/21/2018   PLT 235 01/21/2018   GLUCOSE 118 (H) 01/21/2018   ALT 22 01/21/2018   AST 20 01/21/2018   NA 136 01/21/2018   K 4.7 01/21/2018   CL 104 01/21/2018   CREATININE 1.14 01/21/2018   BUN 24 (H) 01/21/2018   CO2 22 01/21/2018   INR 0.97 01/21/2018   HGBA1C 5.6 01/21/2018    Kerin Perna III, MD 01/25/2018 7:07 AM

## 2018-01-25 NOTE — Procedures (Signed)
Extubation Procedure Note  Patient Details:   Name: Lance Ayala DOB: 10/26/1956 MRN: [id number]   Airway Documentation:  Patient had VC and NIF within normal limits. Positive cuff leak prior to extubation. Pt placed on 4lpm humidified oxygen. Pt instructed on Incentive Spirometry, pt achieved 600cc. Pt has strong cough and able to voice his name.   Evaluation  O2 sats: stable throughout Complications: No apparent complications Patient did tolerate procedure well. Bilateral Breath Sounds: Clear, Rhonchi   Yes  Suszanne Conners 01/25/2018, 5:35 PM

## 2018-01-26 ENCOUNTER — Inpatient Hospital Stay (HOSPITAL_COMMUNITY): Payer: Medicaid Other

## 2018-01-26 ENCOUNTER — Encounter (HOSPITAL_COMMUNITY): Payer: Self-pay | Admitting: Cardiothoracic Surgery

## 2018-01-26 LAB — POCT I-STAT 3, ART BLOOD GAS (G3+)
Acid-Base Excess: 6 mmol/L — ABNORMAL HIGH (ref 0.0–2.0)
Acid-Base Excess: 9 mmol/L — ABNORMAL HIGH (ref 0.0–2.0)
Acid-Base Excess: 3 mmol/L — ABNORMAL HIGH (ref 0.0–2.0)
Bicarbonate: 31 mmol/L — ABNORMAL HIGH (ref 20.0–28.0)
Bicarbonate: 32.7 mmol/L — ABNORMAL HIGH (ref 20.0–28.0)
Bicarbonate: 28.9 mmol/L — ABNORMAL HIGH (ref 20.0–28.0)
O2 Saturation: 100 %
O2 Saturation: 100 %
O2 Saturation: 100 %
TCO2: 32 mmol/L (ref 22–32)
TCO2: 34 mmol/L — ABNORMAL HIGH (ref 22–32)
TCO2: 30 mmol/L (ref 22–32)
pCO2 arterial: 47.3 mmHg (ref 32.0–48.0)
pCO2 arterial: 41.3 mmHg (ref 32.0–48.0)
pCO2 arterial: 47.8 mmHg (ref 32.0–48.0)
pH, Arterial: 7.425 (ref 7.350–7.450)
pH, Arterial: 7.508 — ABNORMAL HIGH (ref 7.350–7.450)
pH, Arterial: 7.389 (ref 7.350–7.450)
pO2, Arterial: 541 mmHg — ABNORMAL HIGH (ref 83.0–108.0)
pO2, Arterial: 549 mmHg — ABNORMAL HIGH (ref 83.0–108.0)
pO2, Arterial: 420 mmHg — ABNORMAL HIGH (ref 83.0–108.0)

## 2018-01-26 LAB — BASIC METABOLIC PANEL
Anion gap: 7 (ref 5–15)
BUN: 14 mg/dL (ref 6–20)
CALCIUM: 8 mg/dL — AB (ref 8.9–10.3)
CO2: 23 mmol/L (ref 22–32)
CREATININE: 1.05 mg/dL (ref 0.61–1.24)
Chloride: 106 mmol/L (ref 101–111)
GFR calc Af Amer: >60 mL/min (ref >60)
GLUCOSE: 120 mg/dL — AB (ref 65–99)
Potassium: 4.6 mmol/L (ref 3.5–5.1)
Sodium: 136 mmol/L (ref 135–145)

## 2018-01-26 LAB — POCT I-STAT, CHEM 8
BUN: 23 mg/dL — ABNORMAL HIGH (ref 6–20)
BUN: 17 mg/dL (ref 6–20)
BUN: 21 mg/dL — ABNORMAL HIGH (ref 6–20)
BUN: 21 mg/dL — ABNORMAL HIGH (ref 6–20)
BUN: 21 mg/dL — ABNORMAL HIGH (ref 6–20)
BUN: 16 mg/dL (ref 6–20)
Calcium, Ion: 1.19 mmol/L (ref 1.15–1.40)
Calcium, Ion: 0.9 mmol/L — ABNORMAL LOW (ref 1.15–1.40)
Calcium, Ion: 1.17 mmol/L (ref 1.15–1.40)
Calcium, Ion: 1.05 mmol/L — ABNORMAL LOW (ref 1.15–1.40)
Calcium, Ion: 1.07 mmol/L — ABNORMAL LOW (ref 1.15–1.40)
Calcium, Ion: 1.21 mmol/L (ref 1.15–1.40)
Chloride: 99 mmol/L — ABNORMAL LOW (ref 101–111)
Chloride: 101 mmol/L (ref 101–111)
Chloride: 96 mmol/L — ABNORMAL LOW (ref 101–111)
Chloride: 100 mmol/L — ABNORMAL LOW (ref 101–111)
Chloride: 99 mmol/L — ABNORMAL LOW (ref 101–111)
Chloride: 99 mmol/L — ABNORMAL LOW (ref 101–111)
Creatinine, Ser: 0.9 mg/dL (ref 0.61–1.24)
Creatinine, Ser: 0.7 mg/dL (ref 0.61–1.24)
Creatinine, Ser: 0.9 mg/dL (ref 0.61–1.24)
Creatinine, Ser: 1 mg/dL (ref 0.61–1.24)
Creatinine, Ser: 1 mg/dL (ref 0.61–1.24)
Creatinine, Ser: 1.2 mg/dL (ref 0.61–1.24)
Glucose, Bld: 111 mg/dL — ABNORMAL HIGH (ref 65–99)
Glucose, Bld: 108 mg/dL — ABNORMAL HIGH (ref 65–99)
Glucose, Bld: 91 mg/dL (ref 65–99)
Glucose, Bld: 161 mg/dL — ABNORMAL HIGH (ref 65–99)
Glucose, Bld: 153 mg/dL — ABNORMAL HIGH (ref 65–99)
Glucose, Bld: 140 mg/dL — ABNORMAL HIGH (ref 65–99)
HCT: 32 % — ABNORMAL LOW (ref 39.0–52.0)
HCT: 22 % — ABNORMAL LOW (ref 39.0–52.0)
HCT: 29 % — ABNORMAL LOW (ref 39.0–52.0)
HCT: 24 % — ABNORMAL LOW (ref 39.0–52.0)
HCT: 24 % — ABNORMAL LOW (ref 39.0–52.0)
HCT: 24 % — ABNORMAL LOW (ref 39.0–52.0)
Hemoglobin: 10.9 g/dL — ABNORMAL LOW (ref 13.0–17.0)
Hemoglobin: 7.5 g/dL — ABNORMAL LOW (ref 13.0–17.0)
Hemoglobin: 9.9 g/dL — ABNORMAL LOW (ref 13.0–17.0)
Hemoglobin: 8.2 g/dL — ABNORMAL LOW (ref 13.0–17.0)
Hemoglobin: 8.2 g/dL — ABNORMAL LOW (ref 13.0–17.0)
Hemoglobin: 8.2 g/dL — ABNORMAL LOW (ref 13.0–17.0)
Potassium: 4.4 mmol/L (ref 3.5–5.1)
Potassium: 4.6 mmol/L (ref 3.5–5.1)
Potassium: 4.9 mmol/L (ref 3.5–5.1)
Potassium: 5.1 mmol/L (ref 3.5–5.1)
Potassium: 5.1 mmol/L (ref 3.5–5.1)
Potassium: 4.6 mmol/L (ref 3.5–5.1)
Sodium: 138 mmol/L (ref 135–145)
Sodium: 137 mmol/L (ref 135–145)
Sodium: 139 mmol/L (ref 135–145)
Sodium: 136 mmol/L (ref 135–145)
Sodium: 135 mmol/L (ref 135–145)
Sodium: 133 mmol/L — ABNORMAL LOW (ref 135–145)
TCO2: 31 mmol/L (ref 22–32)
TCO2: 31 mmol/L (ref 22–32)
TCO2: 31 mmol/L (ref 22–32)
TCO2: 30 mmol/L (ref 22–32)
TCO2: 29 mmol/L (ref 22–32)
TCO2: 26 mmol/L (ref 22–32)

## 2018-01-26 LAB — COOXEMETRY PANEL
Carboxyhemoglobin: 1.3 % (ref 0.5–1.5)
Methemoglobin: 1.1 % (ref 0.0–1.5)
O2 Saturation: 65.3 %
Total hemoglobin: 9.4 g/dL — ABNORMAL LOW (ref 12.0–16.0)

## 2018-01-26 LAB — GLUCOSE, CAPILLARY
Glucose-Capillary: 100 mg/dL — ABNORMAL HIGH (ref 65–99)
Glucose-Capillary: 106 mg/dL — ABNORMAL HIGH (ref 65–99)
Glucose-Capillary: 111 mg/dL — ABNORMAL HIGH (ref 65–99)
Glucose-Capillary: 113 mg/dL — ABNORMAL HIGH (ref 65–99)
Glucose-Capillary: 117 mg/dL — ABNORMAL HIGH (ref 65–99)
Glucose-Capillary: 117 mg/dL — ABNORMAL HIGH (ref 65–99)
Glucose-Capillary: 121 mg/dL — ABNORMAL HIGH (ref 65–99)
Glucose-Capillary: 122 mg/dL — ABNORMAL HIGH (ref 65–99)
Glucose-Capillary: 123 mg/dL — ABNORMAL HIGH (ref 65–99)
Glucose-Capillary: 126 mg/dL — ABNORMAL HIGH (ref 65–99)
Glucose-Capillary: 126 mg/dL — ABNORMAL HIGH (ref 65–99)
Glucose-Capillary: 127 mg/dL — ABNORMAL HIGH (ref 65–99)
Glucose-Capillary: 132 mg/dL — ABNORMAL HIGH (ref 65–99)
Glucose-Capillary: 137 mg/dL — ABNORMAL HIGH (ref 65–99)

## 2018-01-26 LAB — MAGNESIUM
Magnesium: 2.6 mg/dL — ABNORMAL HIGH (ref 1.7–2.4)
Magnesium: 2.3 mg/dL (ref 1.7–2.4)

## 2018-01-26 LAB — CBC
HEMATOCRIT: 26.8 % — AB (ref 39.0–52.0)
HEMATOCRIT: 26 % — AB (ref 39.0–52.0)
Hemoglobin: 8.7 g/dL — ABNORMAL LOW (ref 13.0–17.0)
Hemoglobin: 8.7 g/dL — ABNORMAL LOW (ref 13.0–17.0)
MCH: 30.7 pg (ref 26.0–34.0)
MCH: 32.2 pg (ref 26.0–34.0)
MCHC: 32.5 g/dL (ref 30.0–36.0)
MCHC: 33.5 g/dL (ref 30.0–36.0)
MCV: 94.7 fL (ref 78.0–100.0)
MCV: 96.3 fL (ref 78.0–100.0)
PLATELETS: 138 10*3/uL — AB (ref 150–400)
PLATELETS: 122 10*3/uL — AB (ref 150–400)
RBC: 2.83 MIL/uL — ABNORMAL LOW (ref 4.22–5.81)
RBC: 2.7 MIL/uL — ABNORMAL LOW (ref 4.22–5.81)
RDW: 13.4 % (ref 11.5–15.5)
RDW: 13.8 % (ref 11.5–15.5)
WBC: 15.2 10*3/uL — ABNORMAL HIGH (ref 4.0–10.5)
WBC: 15.1 10*3/uL — ABNORMAL HIGH (ref 4.0–10.5)

## 2018-01-26 LAB — CREATININE, SERUM
Creatinine, Ser: 1.27 mg/dL — ABNORMAL HIGH (ref 0.61–1.24)
GFR calc Af Amer: >60 mL/min (ref >60)
GFR calc non Af Amer: 59 mL/min — ABNORMAL LOW (ref >60)

## 2018-01-26 MED ORDER — INSULIN ASPART 100 UNIT/ML ~~LOC~~ SOLN
0.0000 [IU] | SUBCUTANEOUS | Status: DC
  Administered 2018-01-26 (×3): 2 [IU] via SUBCUTANEOUS

## 2018-01-26 MED ORDER — MORPHINE SULFATE (PF) 4 MG/ML IV SOLN: 1.0000 mg | INTRAVENOUS | Status: DC | PRN

## 2018-01-26 MED ORDER — INSULIN DETEMIR 100 UNIT/ML ~~LOC~~ SOLN
8.0000 [IU] | Freq: Two times a day (BID) | SUBCUTANEOUS | Status: DC
  Administered 2018-01-26 (×2): 8 [IU] via SUBCUTANEOUS
  Filled 2018-01-26 (×3): qty 0.08

## 2018-01-26 MED ORDER — METOCLOPRAMIDE HCL 5 MG/ML IJ SOLN
10.0000 mg | Freq: Four times a day (QID) | INTRAMUSCULAR | Status: AC
  Administered 2018-01-26 – 2018-01-30 (×15): 10 mg via INTRAVENOUS
  Filled 2018-01-26 (×16): qty 2

## 2018-01-26 MED ORDER — MORPHINE SULFATE (PF) 4 MG/ML IV SOLN
2.0000 mg | INTRAVENOUS | Status: DC | PRN
  Administered 2018-01-26: 2 mg via INTRAVENOUS
  Administered 2018-01-26 (×2): 4 mg via INTRAVENOUS
  Filled 2018-01-26 (×3): qty 1

## 2018-01-26 MED ORDER — FUROSEMIDE 10 MG/ML IJ SOLN
20.0000 mg | Freq: Two times a day (BID) | INTRAMUSCULAR | Status: DC
  Administered 2018-01-26 (×2): 20 mg via INTRAVENOUS
  Filled 2018-01-26 (×3): qty 2

## 2018-01-26 MED FILL — Heparin Sodium (Porcine) Inj 1000 Unit/ML: INTRAMUSCULAR | Qty: 30 | Status: AC

## 2018-01-26 MED FILL — Lidocaine HCl IV Inj 20 MG/ML: INTRAVENOUS | Qty: 5 | Status: AC

## 2018-01-26 MED FILL — Electrolyte-R (PH 7.4) Solution: INTRAVENOUS | Qty: 3000 | Status: AC

## 2018-01-26 MED FILL — Heparin Sodium (Porcine) Inj 1000 Unit/ML: INTRAMUSCULAR | Qty: 10 | Status: AC

## 2018-01-26 MED FILL — Sodium Chloride IV Soln 0.9%: INTRAVENOUS | Qty: 2000 | Status: AC

## 2018-01-26 MED FILL — Potassium Chloride Inj 2 mEq/ML: INTRAVENOUS | Qty: 40 | Status: AC

## 2018-01-26 MED FILL — Magnesium Sulfate Inj 50%: INTRAMUSCULAR | Qty: 10 | Status: AC

## 2018-01-26 MED FILL — Mannitol IV Soln 20%: INTRAVENOUS | Qty: 1000 | Status: AC

## 2018-01-26 MED FILL — Sodium Bicarbonate IV Soln 8.4%: INTRAVENOUS | Qty: 50 | Status: AC

## 2018-01-26 NOTE — Progress Notes (Signed)
1 Day Post-Op Procedure(s) (LRB): CORONARY ARTERY BYPASS GRAFTING (CABG), ON PUMP, TIMES FOUR, USING LEFT INTERNAL MAMMARY ARTERY AND ENDOSCOPICALLY HARVESTED RIGHT GREATER SAPHENOUS VEIN (N/A) TRANSESOPHAGEAL ECHOCARDIOGRAM (TEE) (N/A) Subjective: Extubated, stable weaning drips  Objective: Vital signs in last 24 hours: Temp:  [97.7 F (36.5 C)-99.7 F (37.6 C)] 99.3 F (37.4 C) (02/26 0700) Pulse Rate:  [88-90] 89 (02/26 0700) Resp:  [12-24] 15 (02/26 0700) BP: (67-120)/(45-62) 87/51 (02/26 0700) SpO2:  [100 %] 100 % (02/26 0700) Arterial Line BP: (89-192)/(28-58) 133/49 (02/26 0700) FiO2 (%):  [40 %-50 %] 40 % (02/25 1637) Weight:  [181 lb 7 oz (82.3 kg)] 181 lb 7 oz (82.3 kg) (02/26 0417)  Hemodynamic parameters for last 24 hours: PAP: (23-41)/(11-21) 26/13 CO:  [3.6 L/min-6 L/min] 6 L/min CI:  [2 L/min/m2-3.3 L/min/m2] 3.3 L/min/m2  Intake/Output from previous day: 02/25 0701 - 02/26 0700 In: 4952 [P.O.:240; I.V.:3562; Blood:250; IV Piggyback:900] Out: 3110 [Urine:2620; Blood:200; Chest Tube:290] Intake/Output this shift: No intake/output data recorded.       Exam    General- alert and comfortable    Neck- no JVD, no cervical adenopathy palpable, no carotid bruit   Lungs- clear without rales, wheezes   Cor- regular rate and rhythm, no murmur , gallop   Abdomen- soft, non-tender   Extremities - warm, non-tender, minimal edema   Neuro- oriented, appropriate, no focal weakness   Lab Results: Recent Labs    01/25/18 1805 01/25/18 1826 01/26/18 0409  WBC 16.9*  --  15.2*  HGB 9.3* 7.8* 8.7*  HCT 27.4* 23.0* 26.8*  PLT 156  --  138*   BMET:  Recent Labs    01/25/18 1826 01/26/18 0409  NA 137 136  K 4.9 4.6  CL 104 106  CO2  --  23  GLUCOSE 125* 120*  BUN 19 14  CREATININE 1.10 1.05  CALCIUM  --  8.0*    PT/INR:  Recent Labs    01/25/18 1338  LABPROT 16.7*  INR 1.37   ABG    Component Value Date/Time   PHART 7.304 (L) 01/25/2018 1830   HCO3 24.9 01/25/2018 1830   TCO2 26 01/25/2018 1830   ACIDBASEDEF 2.0 01/25/2018 1830   O2SAT 65.3 01/26/2018 0440   CBG (last 3)  Recent Labs    01/26/18 0517 01/26/18 0602 01/26/18 0654  GLUCAP 111* 100* 113*    Assessment/Plan: S/P Procedure(s) (LRB): CORONARY ARTERY BYPASS GRAFTING (CABG), ON PUMP, TIMES FOUR, USING LEFT INTERNAL MAMMARY ARTERY AND ENDOSCOPICALLY HARVESTED RIGHT GREATER SAPHENOUS VEIN (N/A) TRANSESOPHAGEAL ECHOCARDIOGRAM (TEE) (N/A) Mobilize DC a-line diuresis   LOS: 1 day    Lance Ayala 01/26/2018

## 2018-01-26 NOTE — Progress Notes (Signed)
Patient ID: Lance Ayala, male   DOB: 1956/07/05, 62 y.o.   MRN: [id number] TCTS Evening Rounds:  Hemodynamically stable in sinus rhythm on milrinone 0.125 and epi 2 mcg  Urine output good.  BMET    Component Value Date/Time   NA 133 (L) 01/26/2018 1714   NA 142 11/13/2017 1649   K 4.6 01/26/2018 1714   CL 99 (L) 01/26/2018 1714   CO2 23 01/26/2018 0409   GLUCOSE 140 (H) 01/26/2018 1714   BUN 16 01/26/2018 1714   BUN 24 11/13/2017 1649   CREATININE 1.20 01/26/2018 1714   CALCIUM 8.0 (L) 01/26/2018 0409   GFRNONAA 59 (L) 01/26/2018 1706   GFRAA >60 01/26/2018 1706   CBC    Component Value Date/Time   WBC 15.1 (H) 01/26/2018 1706   RBC 2.70 (L) 01/26/2018 1706   HGB 8.2 (L) 01/26/2018 1714   HGB 15.0 10/27/2017 0941   HCT 24.0 (L) 01/26/2018 1714   HCT 43.9 10/27/2017 0941   PLT 122 (L) 01/26/2018 1706   PLT 291 10/27/2017 0941   MCV 96.3 01/26/2018 1706   MCV 92 10/27/2017 0941   MCH 32.2 01/26/2018 1706   MCHC 33.5 01/26/2018 1706   RDW 13.8 01/26/2018 1706   RDW 13.1 10/27/2017 0941   LYMPHSABS 2.1 10/27/2017 0941   EOSABS 0.1 10/27/2017 0941   BASOSABS 0.0 10/27/2017 0941    Stable day.

## 2018-01-27 ENCOUNTER — Inpatient Hospital Stay (HOSPITAL_COMMUNITY): Payer: Medicaid Other

## 2018-01-27 LAB — BASIC METABOLIC PANEL
Anion gap: 7 (ref 5–15)
BUN: 17 mg/dL (ref 6–20)
CO2: 26 mmol/L (ref 22–32)
Calcium: 8.2 mg/dL — ABNORMAL LOW (ref 8.9–10.3)
Chloride: 101 mmol/L (ref 101–111)
Creatinine, Ser: 0.99 mg/dL (ref 0.61–1.24)
GFR calc Af Amer: >60 mL/min (ref >60)
GFR calc non Af Amer: >60 mL/min (ref >60)
Glucose, Bld: 120 mg/dL — ABNORMAL HIGH (ref 65–99)
Potassium: 4.5 mmol/L (ref 3.5–5.1)
Sodium: 134 mmol/L — ABNORMAL LOW (ref 135–145)

## 2018-01-27 LAB — CBC
HCT: 23.4 % — ABNORMAL LOW (ref 39.0–52.0)
Hemoglobin: 7.7 g/dL — ABNORMAL LOW (ref 13.0–17.0)
MCH: 32 pg (ref 26.0–34.0)
MCHC: 32.9 g/dL (ref 30.0–36.0)
MCV: 97.1 fL (ref 78.0–100.0)
Platelets: 112 10*3/uL — ABNORMAL LOW (ref 150–400)
RBC: 2.41 MIL/uL — ABNORMAL LOW (ref 4.22–5.81)
RDW: 13.9 % (ref 11.5–15.5)
WBC: 13.1 10*3/uL — ABNORMAL HIGH (ref 4.0–10.5)

## 2018-01-27 LAB — COOXEMETRY PANEL
Carboxyhemoglobin: 1.5 % (ref 0.5–1.5)
Carboxyhemoglobin: 1.6 % — ABNORMAL HIGH (ref 0.5–1.5)
Methemoglobin: 1 % (ref 0.0–1.5)
Methemoglobin: 1.1 % (ref 0.0–1.5)
O2 Saturation: 50.4 %
O2 Saturation: 54 %
Total hemoglobin: 12 g/dL (ref 12.0–16.0)
Total hemoglobin: 8.4 g/dL — ABNORMAL LOW (ref 12.0–16.0)

## 2018-01-27 LAB — GLUCOSE, CAPILLARY
Glucose-Capillary: 100 mg/dL — ABNORMAL HIGH (ref 65–99)
Glucose-Capillary: 104 mg/dL — ABNORMAL HIGH (ref 65–99)
Glucose-Capillary: 116 mg/dL — ABNORMAL HIGH (ref 65–99)

## 2018-01-27 MED ORDER — MOVING RIGHT ALONG BOOK
Freq: Once | Status: AC
  Administered 2018-01-27: 1
  Filled 2018-01-27: qty 1

## 2018-01-27 MED ORDER — MAGNESIUM HYDROXIDE 400 MG/5ML PO SUSP: 30.0000 mL | Freq: Every day | ORAL | Status: DC | PRN

## 2018-01-27 MED ORDER — FUROSEMIDE 10 MG/ML IJ SOLN
40.0000 mg | Freq: Every day | INTRAMUSCULAR | Status: DC
  Administered 2018-01-27 – 2018-01-31 (×5): 40 mg via INTRAVENOUS
  Filled 2018-01-27 (×5): qty 4

## 2018-01-27 MED ORDER — SODIUM CHLORIDE 0.9% FLUSH
3.0000 mL | Freq: Two times a day (BID) | INTRAVENOUS | Status: DC
  Administered 2018-01-27 – 2018-01-29 (×4): 3 mL via INTRAVENOUS

## 2018-01-27 MED ORDER — SODIUM CHLORIDE 0.9 % IV SOLN: 250.0000 mL | INTRAVENOUS | Status: DC | PRN

## 2018-01-27 MED ORDER — CARVEDILOL 6.25 MG PO TABS
6.2500 mg | ORAL_TABLET | Freq: Two times a day (BID) | ORAL | Status: DC
  Administered 2018-01-27 – 2018-01-29 (×6): 6.25 mg via ORAL
  Filled 2018-01-27 (×6): qty 1

## 2018-01-27 MED ORDER — POTASSIUM CHLORIDE CRYS ER 20 MEQ PO TBCR: 20.0000 meq | EXTENDED_RELEASE_TABLET | Freq: Two times a day (BID) | ORAL | Status: DC

## 2018-01-27 MED ORDER — SODIUM CHLORIDE 0.9% FLUSH: 3.0000 mL | INTRAVENOUS | Status: DC | PRN

## 2018-01-27 NOTE — Progress Notes (Addendum)
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 LV dysfunction Ready for stepdown after milrinone weaned off  Objective: Vital signs in last 24 hours: Temp:  [98.4 F (36.9 C)-99.9 F (37.7 C)] 99 F (37.2 C) (02/27 0820) Pulse Rate:  [82-90] 89 (02/27 0700) Resp:  [14-41] 41 (02/27 0700) BP: (56-150)/(47-84) 129/49 (02/27 0700) SpO2:  [86 %-100 %] 94 % (02/27 0700) Arterial Line BP: (165)/(74) 165/74 (02/26 0900) Weight:  [168 lb 3.4 oz (76.3 kg)] 168 lb 3.4 oz (76.3 kg) (02/27 0400)  Hemodynamic parameters for last 24 hours: PAP: (39)/(22) 39/22  Intake/Output from previous day: 02/26 0701 - 02/27 0700 In: 2009.5 [P.O.:1200; I.V.:809.5] Out: 2315 [Urine:2215; Chest Tube:100] Intake/Output this shift: No intake/output data recorded.       Exam    General- alert and comfortable, mild edema    Neck- no JVD, no cervical adenopathy palpable, no carotid bruit   Lungs- clear without rales, wheezes   Cor- regular rate and rhythm, no murmur , gallop   Abdomen- soft, non-tender   Extremities - warm, non-tender, minimal edema   Neuro- oriented, appropriate, no focal weakness   Lab Results: Recent Labs    01/26/18 0409 01/26/18 1706 01/26/18 1714  WBC 15.2* 15.1*  --   HGB 8.7* 8.7* 8.2*  HCT 26.8* 26.0* 24.0*  PLT 138* 122*  --    BMET:  Recent Labs    01/26/18 0409  01/26/18 1714 01/27/18 0407  NA 136  --  133* 134*  K 4.6  --  4.6 4.5  CL 106  --  99* 101  CO2 23  --   --  26  GLUCOSE 120*  --  140* 120*  BUN 14  --  16 17  CREATININE 1.05   < > 1.20 0.99  CALCIUM 8.0*  --   --  8.2*   < > = values in this interval not displayed.    PT/INR:  Recent Labs    01/25/18 1338  LABPROT 16.7*  INR 1.37   ABG    Component  Value Date/Time   PHART 7.304 (L) 01/25/2018 1830   HCO3 24.9 01/25/2018 1830   TCO2 26 01/26/2018 1714   ACIDBASEDEF 2.0 01/25/2018 1830   O2SAT 65.3 01/26/2018 0440   CBG (last 3)  Recent Labs    01/26/18 2335 01/27/18 0359 01/27/18 0817  GLUCAP 137* 100* 104*    Assessment/Plan: S/P Procedure(s) (LRB): CORONARY ARTERY BYPASS GRAFTING (CABG), ON PUMP, TIMES FOUR, USING LEFT INTERNAL MAMMARY ARTERY AND ENDOSCOPICALLY HARVESTED RIGHT GREATER SAPHENOUS VEIN (N/A) TRANSESOPHAGEAL ECHOCARDIOGRAM (TEE) (N/A)  Wean off milrinone and check co-ox Use low dose coreg for beta blocker, start lisinopril later  LOS: 2 days    Lance Ayala 01/27/2018

## 2018-01-27 NOTE — Progress Notes (Signed)
Patient ID: Lance Ayala, male   DOB: Sep 25, 1956, 62 y.o.   MRN: [id number] EVENING ROUNDS NOTE :     [address]       [place] [id number]             [phone]                 2 Days Post-Op Procedure(s) (LRB): CORONARY ARTERY BYPASS GRAFTING (CABG), ON PUMP, TIMES FOUR, USING LEFT INTERNAL MAMMARY ARTERY AND ENDOSCOPICALLY HARVESTED RIGHT GREATER SAPHENOUS VEIN (N/A) TRANSESOPHAGEAL ECHOCARDIOGRAM (TEE) (N/A)  Total Length of Stay:  LOS: 2 days  BP (!) 121/49   Pulse 78   Temp 99 F (37.2 C) (Oral)   Resp (!) 29   Ht 5\' 6"  (1.676 m)   Wt 168 lb 3.4 oz (76.3 kg)   SpO2 99%   BMI 27.15 kg/m   .Intake/Output      02/26 0701 - 02/27 0700 02/27 0701 - 02/28 0700   P.O. 1200 220   I.V. (mL/kg) 809.5 (10.6) 204.3 (2.7)   Blood     IV Piggyback     Total Intake(mL/kg) 2009.5 (26.3) 424.3 (5.6)   Urine (mL/kg/hr) 2215 (1.2) 500 (0.6)   Blood     Chest Tube 100    Total Output 2315 500   Net -305.5 -75.7          . sodium chloride Stopped (01/26/18 0956)  . sodium chloride Stopped (01/26/18 1015)  . sodium chloride Stopped (01/26/18 0956)  . sodium chloride    . lactated ringers Stopped (01/25/18 1330)  . lactated ringers 20 mL/hr at 01/27/18 1600     Lab Results  Component Value Date   WBC 13.1 (H) 01/27/2018   HGB 7.7 (L) 01/27/2018   HCT 23.4 (L) 01/27/2018   PLT 112 (L) 01/27/2018   GLUCOSE 120 (H) 01/27/2018   ALT 22 01/21/2018   AST 20 01/21/2018   NA 134 (L) 01/27/2018   K 4.5 01/27/2018   CL 101 01/27/2018   CREATININE 0.99 01/27/2018   BUN 17 01/27/2018   CO2 26 01/27/2018   INR 1.37 01/25/2018   HGBA1C 5.6 01/21/2018    Cox 54 on milrinone  hct low  Stable day Walked well Foley out   Delight Ovens MD  Beeper [phone] Office [phone] 01/27/2018 6:10 PM

## 2018-01-28 ENCOUNTER — Inpatient Hospital Stay (HOSPITAL_COMMUNITY): Payer: Medicaid Other

## 2018-01-28 LAB — BASIC METABOLIC PANEL
Anion gap: 10 (ref 5–15)
BUN: 21 mg/dL — ABNORMAL HIGH (ref 6–20)
CO2: 27 mmol/L (ref 22–32)
Calcium: 8.5 mg/dL — ABNORMAL LOW (ref 8.9–10.3)
Chloride: 99 mmol/L — ABNORMAL LOW (ref 101–111)
Creatinine, Ser: 0.99 mg/dL (ref 0.61–1.24)
GFR calc Af Amer: >60 mL/min (ref >60)
GFR calc non Af Amer: >60 mL/min (ref >60)
Glucose, Bld: 118 mg/dL — ABNORMAL HIGH (ref 65–99)
Potassium: 4.2 mmol/L (ref 3.5–5.1)
Sodium: 136 mmol/L (ref 135–145)

## 2018-01-28 LAB — CBC
HCT: 22.3 % — ABNORMAL LOW (ref 39.0–52.0)
Hemoglobin: 7.2 g/dL — ABNORMAL LOW (ref 13.0–17.0)
MCH: 31.2 pg (ref 26.0–34.0)
MCHC: 32.3 g/dL (ref 30.0–36.0)
MCV: 96.5 fL (ref 78.0–100.0)
Platelets: 111 10*3/uL — ABNORMAL LOW (ref 150–400)
RBC: 2.31 MIL/uL — ABNORMAL LOW (ref 4.22–5.81)
RDW: 13.7 % (ref 11.5–15.5)
WBC: 11.2 10*3/uL — ABNORMAL HIGH (ref 4.0–10.5)

## 2018-01-28 LAB — SURGICAL PCR SCREEN
MRSA, PCR: NEGATIVE
Staphylococcus aureus: NEGATIVE

## 2018-01-28 LAB — COOXEMETRY PANEL
Carboxyhemoglobin: 1.1 % (ref 0.5–1.5)
Methemoglobin: 1.6 % — ABNORMAL HIGH (ref 0.0–1.5)
O2 Saturation: 62.9 %
Total hemoglobin: 7.5 g/dL — ABNORMAL LOW (ref 12.0–16.0)

## 2018-01-28 LAB — PREPARE RBC (CROSSMATCH)

## 2018-01-28 MED ORDER — SORBITOL 70 % PO SOLN
30.0000 mL | Freq: Once | ORAL | Status: AC
  Administered 2018-01-28: 30 mL via ORAL
  Filled 2018-01-28: qty 30

## 2018-01-28 MED ORDER — FE FUMARATE-B12-VIT C-FA-IFC PO CAPS
1.0000 | ORAL_CAPSULE | Freq: Two times a day (BID) | ORAL | Status: DC
  Administered 2018-01-28 – 2018-01-31 (×7): 1 via ORAL
  Filled 2018-01-28 (×7): qty 1

## 2018-01-28 NOTE — Progress Notes (Addendum)
3 Days Post-Op Procedure(s) (LRB): CORONARY ARTERY BYPASS GRAFTING (CABG), ON PUMP, TIMES FOUR, USING LEFT INTERNAL MAMMARY ARTERY AND ENDOSCOPICALLY HARVESTED RIGHT GREATER SAPHENOUS VEIN (N/A) TRANSESOPHAGEAL ECHOCARDIOGRAM (TEE) (N/A) Subjective: Co-ox 62% off milrinone nsr CXR with small R hydropneumothorax- sats 100% 2L Hb drifted down to 7.2- expected postop blood loss anemia Objective: Vital signs in last 24 hours: Temp:  [98.6 F (37 C)-99.1 F (37.3 C)] 98.6 F (37 C) (02/28 0745) Pulse Rate:  [56-100] 77 (02/28 0700) Cardiac Rhythm: Normal sinus rhythm (02/28 0400) Resp:  [17-33] 25 (02/28 0700) BP: (76-158)/(38-86) 142/47 (02/28 0700) SpO2:  [89 %-100 %] 100 % (02/28 0700) Weight:  [166 lb 0.1 oz (75.3 kg)] 166 lb 0.1 oz (75.3 kg) (02/28 0500)  Hemodynamic parameters for last 24 hours:  stable  Intake/Output from previous day: 02/27 0701 - 02/28 0700 In: 504.3 [P.O.:220; I.V.:284.3] Out: 950 [Urine:950] Intake/Output this shift: No intake/output data recorded.      Exam    General- alert and comfortable    Neck- no JVD, no cervical adenopathy palpable, no carotid bruit   Lungs- clear without rales, wheezes   Cor- regular rate and rhythm, no murmur , gallop   Abdomen- soft, non-tender   Extremities - warm, non-tender, minimal edema   Neuro- oriented, appropriate, no focal weakness   Lab Results: Recent Labs    01/27/18 0727 01/28/18 0530  WBC 13.1* 11.2*  HGB 7.7* 7.2*  HCT 23.4* 22.3*  PLT 112* 111*   BMET:  Recent Labs    01/27/18 0407 01/28/18 0530  NA 134* 136  K 4.5 4.2  CL 101 99*  CO2 26 27  GLUCOSE 120* 118*  BUN 17 21*  CREATININE 0.99 0.99  CALCIUM 8.2* 8.5*    PT/INR:  Recent Labs    01/25/18 1338  LABPROT 16.7*  INR 1.37   ABG    Component Value Date/Time   PHART 7.304 (L) 01/25/2018 1830   HCO3 24.9 01/25/2018 1830   TCO2 26 01/26/2018 1714   ACIDBASEDEF 2.0 01/25/2018 1830   O2SAT 62.9 01/28/2018 0540   CBG  (last 3)  Recent Labs    01/27/18 0359 01/27/18 0817 01/27/18 1138  GLUCAP 100* 104* 116*    Assessment/Plan: S/P Procedure(s) (LRB): CORONARY ARTERY BYPASS GRAFTING (CABG), ON PUMP, TIMES FOUR, USING LEFT INTERNAL MAMMARY ARTERY AND ENDOSCOPICALLY HARVESTED RIGHT GREATER SAPHENOUS VEIN (N/A) TRANSESOPHAGEAL ECHOCARDIOGRAM (TEE) (N/A) Transfuse DC central line and tx to 4 E Follow R Pntx with CXR in am  LOS: 3 days    Lance Ayala 01/28/2018

## 2018-01-28 NOTE — Care Management Note (Signed)
Case Management Note  Patient Details  Name: Lance Ayala MRN: [id number] Date of Birth: 1956-02-22  Subjective/Objective:  Admitted for Coronary artery bypass grafting x4               Action/Plan: Follow R Pntx with CXR in am 2/29/19  Expected Discharge Date:                  Expected Discharge Plan:   Home/self-care  In-House Referral:     Discharge planning Services  CM Consult  Post Acute Care Choice:    Choice offered to:     DME Arranged:    DME Agency:     HH Arranged:    HH Agency:     Status of Service:  In process, will continue to follow  Additional Comments: Prior to admission lived at home.  PCP noted.  Yancey Flemings, RN  Nurse Case Manager [place] 01/28/2018, 4:06 PM

## 2018-01-28 NOTE — Significant Event (Signed)
Patient transferred to 4E05-taken via wheelchair with Swot RN Shawna Orleans. VS stable prior to transfer. Patient has all his personal belongings with him (clothes). Family (son-Kyle) has been made aware of the transfer. Report given to receiving RN in 4E.    Lance Ayala

## 2018-01-28 NOTE — Progress Notes (Signed)
CARDIAC REHAB PHASE I   PRE:  Rate/Rhythm: 93 SR  BP:  Supine:   Sitting: 160/65  Standing:    SaO2: 90 RA  MODE:  Ambulation: 370 ft   POST:  Rate/Rhythm: 103 ST  BP:  Supine:   Sitting: 145/93  Standing:    SaO2: 90 RA 1500-1530 Assisted X 1 and used walker to ambulate. Gait steady with walker. Pt able to walk 370 feet without c/o. VS stable. Pt back to recliner after walk with call light in reach. Pt very cooperative with walking, encouraged use of IS hourly.  Melina Copa RN 01/28/2018 3:26 PM

## 2018-01-28 NOTE — Op Note (Signed)
NAME:  RIDER, LOCKARD NO.:  MEDICAL RECORD NO.:  [id number]  LOCATION:                                 FACILITY:  PHYSICIAN:  Kerin Perna, M.D.  DATE OF BIRTH:  10/14/1956  DATE OF PROCEDURE:  01/25/2018 DATE OF DISCHARGE:                              OPERATIVE REPORT   OPERATION: 1. Coronary artery bypass grafting x4 (left internal mammary artery to     LAD, sequential saphenous vein graft to OM1 and OM2, saphenous vein     graft to posterior descending). 2. Endoscopic harvest of right leg greater saphenous vein.  SURGEON:  Kerin Perna, MD.  ASSISTANT:  Rowe Clack, P.A.-C.  ANESTHESIA:  General.  PREOPERATIVE DIAGNOSES:  Moderate left ventricular dysfunction with three-vessel coronary artery disease, history of recent right carotid endarterectomy.  POSTOPERATIVE DIAGNOSES:  Moderate left ventricular dysfunction with three-vessel coronary artery disease, history of recent right carotid endarterectomy.  CLINICAL NOTE:  The patient is a 62 year old diabetic with progressive angina, significant 3-vessel CAD and moderate LV dysfunction with EF 35%.  He was recommended for multivessel CABG by his cardiologist. During the evaluation process, a 90% right carotid artery stenosis was found.  The patient underwent carotid endarterectomy successfully by Dr. Arbie Cookey and now has recovered and is scheduled for CABG.  I interviewed and examined the patient in the office on several occasions to discuss CABG surgery for treatment of his CAD.  I had previously discussed with him the expected benefits, alternatives, and the potential risks of the operation.  He understood those risks to include the possibility of stroke, bleeding, blood transfusion, postoperative pulmonary problems including pleural effusion, postoperative infection, and death.  After reviewing these issues, he demonstrated his understanding and agreed to proceed with surgery under what I  felt was an informed consent.  OPERATIVE FINDINGS: 1. Small conduit but usable. 2. Small native vessels. 3. Improved global LV function after separation from coronary bypass     grafting.  DESCRIPTION OF PROCEDURE:  The patient was brought to the operating room and placed supine on the operating room table, where general anesthesia was induced under invasive hemodynamic monitoring.  A transesophageal echo probe was placed by the anesthesia team.  The chest, abdomen, and legs were prepped with Betadine and draped as a sterile field.  A proper time-out was performed.  A sternal incision was made as the saphenous vein was harvested endoscopically from the right leg.  The vein was exposed.  The left leg was too small to use and the right leg vein was superior.  Compared to the left leg, the right leg drain was better.  After the mammary artery was harvested.  The sternal retractor was placed, and the pericardium was opened and suspended.  Pursestrings were placed in the ascending aorta and right atrium.  Heparin was administered and the ACT was documented as being therapeutic.  The patient was cannulated and placed on cardiopulmonary bypass.  The coronaries were identified for grafting and the mammary artery and vein grafts were prepared for the distal anastomoses.  Cardioplegia cannulas were placed both antegrade and retrograde cold blood cardioplegia.  The patient was cooled to 32 degrees and the aortic crossclamp was applied. A liter of cold blood cardioplegia was delivered in split doses between the antegrade aortic and retrograde coronary sinus catheters.  There was good cardioplegic arrest and septal temperature dropped less than 12 degrees.  Cardioplegia was delivered every 20 minutes.  The distal coronary anastomoses were performed.  The first distal anastomosis was to the posterior descending branch of right coronary artery.  This had a high-grade 80-90% stenosis.  A reverse  saphenous vein was sewn end-to-side with running 7-0 Prolene with good flow through the graft.  Cardioplegia was redosed.  The second and third distal anastomoses were then performed consisting of a sequential vein graft to the OM1 and OM2.  Each was a 1.4-mm vessel proximal 80-90% stenosis.  First, the vein was sewn side-to-side to the OM1 with running 7-0 Prolene and then continued and an end-to-side anastomosis to the OM2.  There was good flow through the sequential vein graft and cardioplegia was redosed.  The fourth distal anastomosis was to the distal LAD.  This was a small vessel deep in the myocardium.  The left IMA pedicle was brought through an opening and the left lateral pericardium was brought down onto the LAD and sewn end-to-side with running 8-0 Prolene.  There was good flow through the anastomosis after briefly removing the pedicle bulldog on the mammary artery.  The bulldog was reapplied and the pedicle was secured to the epicardium.  Cardioplegia was redosed.  While the crossclamp was still in place, 2 proximal vein anastomoses were performed on the ascending aorta using a 4.0 mm punch running 6-0 Prolene.  Prior to tying down the final proximal anastomosis, air was vented from the coronaries with a dose of retrograde warm blood cardioplegia in the usual de-airing maneuvers on bypass.  The crossclamp was removed.  The vein grafts were de-aired and opened.  Each had good flow. Hemostasis was documented at the proximal and distal sites.  The cardioplegia cannulas were removed.  The patient was rewarmed and reperfused.  Temporary pacing wires were applied.  The lungs were expanded and the ventilator was resumed.  The patient was then weaned off cardiopulmonary bypass on low-dose inotropes with improved global LV function and excellent hemodynamics. Protamine was administered without adverse reaction.  The cannulas were removed.  The mediastinum was irrigated with  saline.  The superior pericardial fat was closed over the aorta and vein grafts.  Anterior mediastinal and left pleural chest tubes were placed and brought out through separate incisions.  The sternum was closed with wire.  The patient remained stable.  The pectoralis fascia was closed with a running #1 Vicryl.  The subcutaneous and skin layers were closed in running Vicryl.  Total cardiopulmonary bypass time was 140 minutes.     Kerin Perna, M.D.     PV/MEDQ  D:  01/26/2018  T:  01/27/2018  Job:  [id number]

## 2018-01-28 NOTE — Progress Notes (Signed)
Pt ambulated about 900 ft. Before bed. Pt tolerated well. VSS. Will continue to monitor.   Judithann Sheen, RN

## 2018-01-29 ENCOUNTER — Inpatient Hospital Stay (HOSPITAL_COMMUNITY): Payer: Medicaid Other

## 2018-01-29 LAB — BASIC METABOLIC PANEL
Anion gap: 13 (ref 5–15)
BUN: 20 mg/dL (ref 6–20)
CO2: 26 mmol/L (ref 22–32)
Calcium: 8.8 mg/dL — ABNORMAL LOW (ref 8.9–10.3)
Chloride: 98 mmol/L — ABNORMAL LOW (ref 101–111)
Creatinine, Ser: 1.02 mg/dL (ref 0.61–1.24)
GFR calc Af Amer: >60 mL/min (ref >60)
GFR calc non Af Amer: >60 mL/min (ref >60)
Glucose, Bld: 109 mg/dL — ABNORMAL HIGH (ref 65–99)
Potassium: 3.8 mmol/L (ref 3.5–5.1)
Sodium: 137 mmol/L (ref 135–145)

## 2018-01-29 LAB — CBC
HCT: 28.9 % — ABNORMAL LOW (ref 39.0–52.0)
Hemoglobin: 9.6 g/dL — ABNORMAL LOW (ref 13.0–17.0)
MCH: 31.1 pg (ref 26.0–34.0)
MCHC: 33.2 g/dL (ref 30.0–36.0)
MCV: 93.5 fL (ref 78.0–100.0)
Platelets: 154 10*3/uL (ref 150–400)
RBC: 3.09 MIL/uL — ABNORMAL LOW (ref 4.22–5.81)
RDW: 14.3 % (ref 11.5–15.5)
WBC: 10.8 10*3/uL — ABNORMAL HIGH (ref 4.0–10.5)

## 2018-01-29 MED ORDER — LISINOPRIL 10 MG PO TABS
10.0000 mg | ORAL_TABLET | Freq: Every day | ORAL | Status: DC
  Administered 2018-01-29 – 2018-01-30 (×2): 10 mg via ORAL
  Filled 2018-01-29 (×2): qty 1

## 2018-01-29 MED ORDER — SPIRONOLACTONE 25 MG PO TABS
25.0000 mg | ORAL_TABLET | Freq: Every day | ORAL | Status: DC
  Administered 2018-01-29 – 2018-01-31 (×3): 25 mg via ORAL
  Filled 2018-01-29 (×3): qty 1

## 2018-01-29 MED ORDER — POTASSIUM CHLORIDE CRYS ER 20 MEQ PO TBCR
20.0000 meq | EXTENDED_RELEASE_TABLET | Freq: Once | ORAL | Status: AC
  Administered 2018-01-29: 20 meq via ORAL
  Filled 2018-01-29: qty 1

## 2018-01-29 NOTE — Progress Notes (Signed)
[address]       [place] [address]             [phone]      4 Days Post-Op Procedure(s) (LRB): CORONARY ARTERY BYPASS GRAFTING (CABG), ON PUMP, TIMES FOUR, USING LEFT INTERNAL MAMMARY ARTERY AND ENDOSCOPICALLY HARVESTED RIGHT GREATER SAPHENOUS VEIN (N/A) TRANSESOPHAGEAL ECHOCARDIOGRAM (TEE) (N/A) Subjective: Feels well, no specific c/o  Objective: Vital signs in last 24 hours: Temp:  [97.8 F (36.6 C)-99 F (37.2 C)] 97.8 F (36.6 C) (03/01 0441) Pulse Rate:  [67-100] 96 (03/01 0441) Cardiac Rhythm: Normal sinus rhythm;Bundle branch block (02/28 1900) Resp:  [18-32] 18 (03/01 0441) BP: (124-176)/(55-99) 163/67 (03/01 0441) SpO2:  [90 %-100 %] 93 % (03/01 0441) Weight:  [161 lb 1.6 oz (73.1 kg)] 161 lb 1.6 oz (73.1 kg) (03/01 0441)  Hemodynamic parameters for last 24 hours:    Intake/Output from previous day: 02/28 0701 - 03/01 0700 In: 945 [P.O.:360; I.V.:270; Blood:315] Out: 850 [Urine:850] Intake/Output this shift: No intake/output data recorded.  General appearance: alert, cooperative and no distress Heart: regular rate and rhythm Lungs: clear to auscultation bilaterally Abdomen: benign Extremities: + BLE edema Wound: incis healing well  Lab Results: Recent Labs    01/28/18 0530 01/29/18 0250  WBC 11.2* 10.8*  HGB 7.2* 9.6*  HCT 22.3* 28.9*  PLT 111* 154   BMET:  Recent Labs    01/28/18 0530 01/29/18 0250  NA 136 137  K 4.2 3.8  CL 99* 98*  CO2 27 26  GLUCOSE 118* 109*  BUN 21* 20  CREATININE 0.99 1.02  CALCIUM 8.5* 8.8*    PT/INR: No results for input(s): LABPROT, INR in the last 72 hours. ABG    Component Value Date/Time   PHART 7.304 (L) 01/25/2018 1830   HCO3 24.9 01/25/2018 1830   TCO2 26 01/26/2018 1714   ACIDBASEDEF 2.0 01/25/2018 1830   O2SAT 62.9 01/28/2018 0540   CBG (last 3)  Recent Labs    01/27/18 0359 01/27/18 0817 01/27/18 1138  GLUCAP 100* 104* 116*    Meds Scheduled Meds: .  acetaminophen  1,000 mg Oral Q6H   Or  . acetaminophen (TYLENOL) oral liquid 160 mg/5 mL  1,000 mg Per Tube Q6H  . aspirin EC  325 mg Oral Daily   Or  . aspirin  324 mg Per Tube Daily  . atorvastatin  80 mg Oral QPM  . bisacodyl  10 mg Oral Daily   Or  . bisacodyl  10 mg Rectal Daily  . carvedilol  6.25 mg Oral BID WC  . docusate sodium  200 mg Oral Daily  . ferrous fumarate-b12-vitamic C-folic acid  1 capsule Oral BID PC  . furosemide  40 mg Intravenous Daily  . mouth rinse  15 mL Mouth Rinse BID  . metoCLOPramide (REGLAN) injection  10 mg Intravenous Q6H  . pantoprazole  40 mg Oral Daily  . sodium chloride flush  3 mL Intravenous Q12H  . sodium chloride flush  3 mL Intravenous Q12H   Continuous Infusions: . sodium chloride Stopped (01/26/18 0956)  . sodium chloride Stopped (01/26/18 1015)  . sodium chloride Stopped (01/26/18 0956)  . sodium chloride    . lactated ringers Stopped (01/25/18 1330)  . lactated ringers Stopped (01/28/18 0800)   PRN Meds:.sodium chloride, sodium chloride, magnesium hydroxide, metoprolol tartrate, ondansetron (ZOFRAN) IV, oxyCODONE, sodium chloride flush, sodium chloride flush, traMADol  Xrays Dg Chest 2 View  Addendum Date: 01/28/2018  ADDENDUM REPORT: 01/28/2018 07:53 ADDENDUM: Study discussed by telephone with Dr. Kathlee Nations TRIGT on 01/28/2018 at 0740 hours. Electronically Signed   By: Odessa Fleming M.D.   On: 01/28/2018 07:53   Result Date: 01/28/2018 CLINICAL DATA:  62 year old male postoperative day 3 status post CABG. EXAM: CHEST  2 VIEW COMPARISON:  01/27/2018 and earlier. FINDINGS: PA and lateral views. Right IJ introducer sheath and epicardial pacer wires remain. There is a small to moderate size right pneumothorax (see also lateral view pleural edge anteriorly) and superimposed small volume layering right pleural effusion. A flat horizontal small layering pleural effusion also suggests a small left-side pneumothorax, although no left-side pleural  edge is identified. Mediastinal contours remain normal. Sequelae of sternotomy and CABG. No pulmonary edema. Streaky lung base atelectasis associated with the pleural pleural fluid. No other confluent pulmonary opacity. Stable visualized osseous structures. Visualized tracheal air column is within normal limits. Calcified aortic atherosclerosis. Negative visible bowel gas pattern. IMPRESSION: 1. Evidence of bilateral hydropneumothoraces, small on the left but possibly a moderate volume pneumothorax on the right (see lateral view). 2. Small volume pleural fluid bilaterally with bilateral lung base atelectasis. Electronically Signed: By: Odessa Fleming M.D. On: 01/28/2018 07:27   Dg Chest Port 1 View  Result Date: 01/27/2018 CLINICAL DATA:  Reason for exam: Hx of CABG Patient awake and states he is in no pain anywhere other than the site of surgery. Slight SOB, with a cough producing yellow mucus since surgery, both starting after surgery Monday. EXAM: PORTABLE CHEST 1 VIEW COMPARISON:  01/26/2018 FINDINGS: Swan-Ganz catheter has been removed. RIGHT IJ sheath remains in place. LEFT-sided chest tube has been removed. There is no pneumothorax. Status post median sternotomy and CABG. There is minimal bibasilar atelectasis. No edema. IMPRESSION: No pneumothorax following chest tube removal. Swan-Ganz catheter removed.  Bibasilar atelectasis. Electronically Signed   By: Norva Pavlov M.D.   On: 01/27/2018 10:28    Assessment/Plan: S/P Procedure(s) (LRB): CORONARY ARTERY BYPASS GRAFTING (CABG), ON PUMP, TIMES FOUR, USING LEFT INTERNAL MAMMARY ARTERY AND ENDOSCOPICALLY HARVESTED RIGHT GREATER SAPHENOUS VEIN (N/A) TRANSESOPHAGEAL ECHOCARDIOGRAM (TEE) (N/A)  1 doing well, H/H is improved 2 cont to diurese for volume overload. Restart home spironolactone 3 pntx, small and stable- follow clinically, sats ok on RA 5 HTN- add ACE-I. Renal fxn normal 6 on high dose statin- cont for now same- will need follow-up long  term and lifestyle modification. A1C 5.6 - diet modification would help- sugars controlled currently fairly well 7 sinus rhythm, d/c epw's 8 pulm toilet/rehab 9 prob home 1-2 days  LOS: 4 days    Rowe Clack 01/29/2018

## 2018-01-29 NOTE — Care Management Note (Signed)
Case Management Note  Patient Details  Name: Lance Ayala MRN: [id number] Date of Birth: 12/26/1955  Subjective/Objective:               Patient from home, will be discharged to home with son. Patient provided [facility] letter for use at DC. Please provide with paper Rx at DC so he can use a local participating pharmacy.      Action/Plan:   Expected Discharge Date:                  Expected Discharge Plan:  Home/Self Care  In-House Referral:     Discharge planning Services  CM Consult, MATCH Program  Post Acute Care Choice:    Choice offered to:     DME Arranged:    DME Agency:     HH Arranged:    HH Agency:     Status of Service:  Completed, signed off  If discussed at [employer] of Stay Meetings, dates discussed:    Additional Comments:  [name], RN 01/29/2018, 11:07 AM

## 2018-01-29 NOTE — Progress Notes (Signed)
CARDIAC REHAB PHASE I   PRE:  Rate/Rhythm: 104 ST  BP:  Sitting: 143/83     SaO2: 95% RA  MODE:  Ambulation: 370 ft   POST:  Rate/Rhythm: 105 ST  BP:  Sitting: 143/81     SaO2: 93% RA  0956-1007  Pt ambulated 332ft with steady gait and standby assist. No complaints during walk.   [id number] Pt and son educated about sternal precautions, IS use, and lifestyle modifications.  No questions.  Verbalized understanding.  CRPII Referral made to [facility].   Nikki Dom, RN 01/29/2018 10:16 AM

## 2018-01-29 NOTE — Discharge Summary (Signed)
Physician Discharge Summary  Patient ID: Lance Ayala MRN: [id number] DOB/AGE: [birth year] 62 y.o.  Admit date: 01/25/2018 Discharge date: 01/31/2018  Admission Diagnoses:  Patient Active Problem List   Diagnosis Date Noted  . Carotid stenosis 12/28/2017  . Dyslipidemia 11/13/2017  . LBBB (left bundle branch block) 10/26/2017  . Chronic systolic heart failure (HCC) 10/26/2017  . Cardiomyopathy (HCC) 10/26/2017  . Chest pain 10/21/2017  . Chest tightness 10/21/2017  . Coronary artery disease involving native coronary artery of native heart with angina pectoris (HCC) 10/21/2017  . Hypertensive heart disease with heart failure (HCC) 10/21/2017  . Myocardial infarction (HCC) 10/21/2017  . COPD (chronic obstructive pulmonary disease) (HCC) 10/21/2017   Discharge Diagnoses:   Patient Active Problem List   Diagnosis Date Noted  . S/P CABG x 4 01/25/2018  . Carotid stenosis 12/28/2017  . Dyslipidemia 11/13/2017  . LBBB (left bundle branch block) 10/26/2017  . Chronic systolic heart failure (HCC) 10/26/2017  . Cardiomyopathy (HCC) 10/26/2017  . Chest pain 10/21/2017  . Chest tightness 10/21/2017  . Coronary artery disease involving native coronary artery of native heart with angina pectoris (HCC) 10/21/2017  . Hypertensive heart disease with heart failure (HCC) 10/21/2017  . Myocardial infarction (HCC) 10/21/2017  . COPD (chronic obstructive pulmonary disease) (HCC) 10/21/2017   Discharged Condition: good  History of Present Illness:  Mr. Lance Ayala is a 62 yo white male with known 3 vessel CAD S/P PCI performed several years ago, with a moderately reduced LV function. He presented back in December with complaints of fatigue and poor exercise tolerance.  He denied chest pain at that time. He has been followed routinely by Dr. Dulce Sellar who felt due to patients symptoms and lack of improvement with medical management that coronary bypass grafting would be indicated.  He was referred to [facility]  for surgical evaluation.  He was evaluated by Dr. Donata Clay who felt coronary bypass would be indicated for benefit and survival.  The risks and benefits of the procedure were explained to the patient and he was agreeable to proceed. However, during preoperative workup he was found to have significant carotid artery stenosis.  Due to this he was referred to Vascular surgery and ultimately required a Right sided Endarterectomy performed on 12/28/2017.  Once patient recovered from procedure he again followed up with Dr. Donata Clay and was scheduled to proceed with coronary bypass procedure.   Hospital Course:   Mr. Lance Ayala presented to [facility] on 01/25/2018.  He was taken to the operating room and underwent CABG x 4 utilizing LIMA to LAD, SVG to PDA, and Sequential SVG to OM1 and OM2.  He also underwent endoscopic harvest of greater saphenous vein from right leg.  He tolerated the procedure without difficulty and was taken to the SICU in stable condition.  He was extubated the evening of surgery.  During his stay in the SICU the patient was weaned off Milrinone and Epinephrine as tolerated.  His chest tubes and arterial lines were removed without difficulty.  He developed post operative blood loss anemia and was transfused packed cells for this. He was maintaining NSR.  He was ambulating with minimal difficulty.  He was transferred to the telemetry unit for further care on 01/28/2018. The patient continues to progress.  He has been started on a ACE inhibitor for HTN.  He is volume overloaded and was started on home Spironolactone.  He is maintaining NSR and his pacing wires have been removed.  He continues to ambulate independently.  His incisions are healing without evidence of infection.  He is medically stable for discharge home today.    Significant Diagnostic Studies: angiography:    Previously placed Ost RCA to Prox RCA stent (unknown type) is widely patent.  Mid RCA lesion is 60%  stenosed.  Dist RCA lesion is 80% stenosed.  Ost LAD lesion is 60% stenosed.  Ost Ramus to Ramus lesion is 90% stenosed.  Ost 1st Mrg to 1st Mrg lesion is 90% stenosed.  Ost 1st Diag to 1st Diag lesion is 75% stenosed.  Prox LAD to Mid LAD lesion is 75% stenosed.  Mid LM to Dist LM lesion is 50% stenosed.  The left ventricular ejection fraction is 25-35% by visual estimate.  LV end diastolic pressure is normal.  There is severe left ventricular systolic dysfunction.   1. Severe multivessel CAD as outlined above with moderate distal left main stenosis, severe ramus/OM1 stenoses, severe RCA stenosis, and severe LAD/diagonal stenosis 2. Severe segmental LV systolic dysfunction 3. Normal LVEDP  Treatments: surgery:   1. Coronary artery bypass grafting x4 (left internal mammary artery to     LAD, sequential saphenous vein graft to OM1 and OM2, saphenous vein     graft to posterior descending). 2. Endoscopic harvest of right leg greater saphenous vein.  Disposition: 01-Home or Self Care   Discharge Medications:  The patient has been discharged on:   1.Beta Blocker:  Yes [ x  ]                              No   [   ]                              If No, reason:  2.Ace Inhibitor/ARB: Yes [ x  ]                                     No  [    ]                                     If No, reason:  3.Statin:   Yes [x   ]                  No  [   ]                  If No, reason:  4.Ecasa:  Yes  [ x  ]                  No   [   ]                  If No, reason:     Discharge Instructions    Discharge patient   Complete by:  As directed    4 hours after pacer wires d/c'd   Discharge disposition:  01-Home or Self Care   Discharge patient date:  01/31/2018     Allergies as of 01/31/2018   No Known Allergies     Medication List    STOP taking these medications   acetaminophen 325 MG tablet Commonly known as:  TYLENOL   furosemide 40 MG tablet Commonly known as:  LASIX   nitroGLYCERIN 0.4 MG SL tablet Commonly known as:  NITROSTAT     TAKE these medications   aspirin 325 MG EC tablet Take 1 tablet (325 mg total) by mouth daily. What changed:    medication strength  how much to take   atorvastatin 80 MG tablet Commonly known as:  LIPITOR Take 1 tablet (80 mg total) by mouth every evening.   carvedilol 12.5 MG tablet Commonly known as:  COREG Take 1 tablet (12.5 mg total) by mouth 2 (two) times daily with a meal. What changed:    medication strength  how much to take   lisinopril 20 MG tablet Commonly known as:  PRINIVIL,ZESTRIL Take 1 tablet (20 mg total) by mouth at bedtime. What changed:    medication strength  how much to take   oxyCODONE 5 MG immediate release tablet Commonly known as:  Oxy IR/ROXICODONE Take 1-2 tablets (5-10 mg total) by mouth every 6 (six) hours as needed for severe pain.   spironolactone 25 MG tablet Commonly known as:  ALDACTONE TAKE ONE TABLET BY MOUTH EVERY DAY      Follow-up Information    Anselmo Pickler, MD. Call.   Specialty:  Family Medicine Contact information: [address] [place] [address] [phone]        Kerin Perna, MD Follow up on 03/03/2018.   Specialty:  Cardiothoracic Surgery Why:  Appointment is at 11:30, please get CXR at 11:00 at [facility] located on first floor of our office building Contact information: [address] [place] [address] [phone]        Allayne Butcher, PA-C Follow up on 02/16/2018.   Specialties:  Cardiology, Radiology Why:  Appointment is at 10:00 Contact information: [address] [place] [address] [phone]           Signed: Rowe Clack 01/31/2018, 8:05 AM

## 2018-01-29 NOTE — Progress Notes (Signed)
Pt with 32 beat run of SVT at 12:27, 24 seconds. Pt asymptomatic. BP 103/65. Pt states he in the bed and had a "coughing spell". [place] in [place]. Verbal order to hold off pulling wires. Wayne or another CT PA will assess.   Leonidas Romberg, RN

## 2018-01-29 NOTE — Discharge Instructions (Signed)
Coronary Artery Bypass Grafting, Care After °This sheet gives you information about how to care for yourself after your procedure. Your health care provider may also give you more specific instructions. If you have problems or questions, contact your health care provider. °What can I expect after the procedure? °After the procedure, it is common to have: °· Nausea and a lack of appetite. °· Constipation. °· Weakness and fatigue. °· Depression or irritability. °· Pain or discomfort in your incision areas. ° °Follow these instructions at home: °Medicines °· Take over-the-counter and prescription medicines only as told by your health care provider. Do not stop taking medicines or start any new medicines without approval from your health care provider. °· If you were prescribed an antibiotic medicine, take it as told by your health care provider. Do not stop taking the antibiotic even if you start to feel better. °· Do not drive or use heavy machinery while taking prescription pain medicine. °Incision care °· Follow instructions from your health care provider about how to take care of your incisions. Make sure you: °? Wash your hands with soap and water before you change your bandage (dressing). If soap and water are not available, use hand sanitizer. °? Change your dressing as told by your health care provider. °? Leave stitches (sutures), skin glue, or adhesive strips in place. These skin closures may need to stay in place for 2 weeks or longer. If adhesive strip edges start to loosen and curl up, you may trim the loose edges. Do not remove adhesive strips completely unless your health care provider tells you to do that. °· Keep incision areas clean, dry, and protected. °· Check your incision areas every day for signs of infection. Check for: °? More redness, swelling, or pain. °? More fluid or blood. °? Warmth. °? Pus or a bad smell. °· If incisions were made in your legs: °? Avoid crossing your legs. °? Avoid  sitting for long periods of time. Change positions every 30 minutes. °? Raise (elevate) your legs when you are sitting. °Bathing °· Do not take baths, swim, or use a hot tub until your health care provider approves. °· Only take sponge baths. Pat the incisions dry. Do not rub incisions with a washcloth or towel. °· Ask your health care provider when you can shower. °Eating and drinking °· Eat foods that are high in fiber, such as raw fruits and vegetables, whole grains, beans, and nuts. Meats should be lean cut. Avoid canned, processed, and fried foods. This can help prevent constipation and is a recommended part of a heart-healthy diet. °· Drink enough fluid to keep your urine clear or pale yellow. °· Limit alcohol intake to no more than 1 drink a day for nonpregnant women and 2 drinks a day for men. One drink equals 12 oz of beer, 5 oz of wine, or 1½ oz of hard liquor. °Activity °· Rest and limit your activity as told by your health care provider. You may be instructed to: °? Stop any activity right away if you have chest pain, shortness of breath, irregular heartbeats, or dizziness. Get help right away if you have any of these symptoms. °? Move around frequently for short periods or take short walks as directed by your health care provider. Gradually increase your activities. You may need physical therapy or cardiac rehabilitation to help strengthen your muscles and build your endurance. °? Avoid lifting, pushing, or pulling anything that is heavier than 10 lb (4.5 kg) for at   least 6 weeks or as told by your health care provider. °· Do not drive until your health care provider approves. °· Ask your health care provider when you may return to work. °· Ask your health care provider when you may resume sexual activity. °General instructions °· Do not use any products that contain nicotine or tobacco, such as cigarettes and e-cigarettes. If you need help quitting, ask your health care provider. °· Take 2-3 deep  breaths every few hours during the day, while you recover. This helps expand your lungs and prevent complications like pneumonia after surgery. °· If you were given a device called an incentive spirometer, use it several times a day to practice deep breathing. Support your chest with a pillow or your arms when you take deep breaths or cough. °· Wear compression stockings as told by your health care provider. These stockings help to prevent blood clots and reduce swelling in your legs. °· Weigh yourself every day. This helps identify if your body is holding (retaining) fluid that may make your heart and lungs work harder. °· Keep all follow-up visits as told by your health care provider. This is important. °Contact a health care provider if: °· You have more redness, swelling, or pain around any incision. °· You have more fluid or blood coming from any incision. °· Any incision feels warm to the touch. °· You have pus or a bad smell coming from any incision °· You have a fever. °· You have swelling in your ankles or legs. °· You have pain in your legs. °· You gain 2 lb (0.9 kg) or more a day. °· You are nauseous or you vomit. °· You have diarrhea. °Get help right away if: °· You have chest pain that spreads to your jaw or arms. °· You are short of breath. °· You have a fast or irregular heartbeat. °· You notice a "clicking" in your breastbone (sternum) when you move. °· You have numbness or weakness in your arms or legs. °· You feel dizzy or light-headed. °Summary °· After the procedure, it is common to have pain or discomfort in the incision areas. °· Do not take baths, swim, or use a hot tub until your health care provider approves. °· Gradually increase your activities. You may need physical therapy or cardiac rehabilitation to help strengthen your muscles and build your endurance. °· Weigh yourself every day. This helps identify if your body is holding (retaining) fluid that may make your heart and lungs work  harder. °This information is not intended to replace advice given to you by your health care provider. Make sure you discuss any questions you have with your health care provider. °Document Released: 06/06/2005 Document Revised: 10/06/2016 Document Reviewed: 10/06/2016 °Elsevier Interactive Patient Education © 2018 [employer] ° ° °Endoscopic Saphenous Vein Harvesting, Care After °Refer to this sheet in the next few weeks. These instructions provide you with information about caring for yourself after your procedure. Your health care provider may also give you more specific instructions. Your treatment has been planned according to current medical practices, but problems sometimes occur. Call your health care provider if you have any problems or questions after your procedure. °What can I expect after the procedure? °After the procedure, it is common to have: °· Pain. °· Bruising. °· Swelling. °· Numbness. ° °Follow these instructions at home: °Medicine °· Take over-the-counter and prescription medicines only as told by your health care provider. °· Do not drive or operate heavy machinery   while taking prescription pain medicine. °Incision care ° °· Follow instructions from your health care provider about how to take care of the cut made during surgery (incision). Make sure you: °? Wash your hands with soap and water before you change your bandage (dressing). If soap and water are not available, use hand sanitizer. °? Change your dressing as told by your health care provider. °? Leave stitches (sutures), skin glue, or adhesive strips in place. These skin closures may need to be in place for 2 weeks or longer. If adhesive strip edges start to loosen and curl up, you may trim the loose edges. Do not remove adhesive strips completely unless your health care provider tells you to do that. °· Check your incision area every day for signs of infection. Check for: °? More redness, swelling, or pain. °? More fluid or  blood. °? Warmth. °? Pus or a bad smell. °General instructions °· Raise (elevate) your legs above the level of your heart while you are sitting or lying down. °· Do any exercises your health care providers have given you. These may include deep breathing, coughing, and walking exercises. °· Do not shower, take baths, swim, or use a hot tub unless told by your health care provider. °· Wear your elastic stocking if told by your health care provider. °· Keep all follow-up visits as told by your health care provider. This is important. °Contact a health care provider if: °· Medicine does not help your pain. °· Your pain gets worse. °· You have new leg bruises or your leg bruises get bigger. °· You have a fever. °· Your leg feels numb. °· You have more redness, swelling, or pain around your incision. °· You have more fluid or blood coming from your incision. °· Your incision feels warm to the touch. °· You have pus or a bad smell coming from your incision. °Get help right away if: °· Your pain is severe. °· You develop pain, tenderness, warmth, redness, or swelling in any part of your leg. °· You have chest pain. °· You have trouble breathing. °This information is not intended to replace advice given to you by your health care provider. Make sure you discuss any questions you have with your health care provider. °Document Released: 07/30/2011 Document Revised: 04/24/2016 Document Reviewed: 10/01/2015 °Elsevier Interactive Patient Education © 2018 [employer] ° ° °

## 2018-01-30 LAB — BASIC METABOLIC PANEL
Anion gap: 13 (ref 5–15)
BUN: 18 mg/dL (ref 6–20)
CHLORIDE: 99 mmol/L — AB (ref 101–111)
CO2: 28 mmol/L (ref 22–32)
CREATININE: 0.91 mg/dL (ref 0.61–1.24)
Calcium: 9 mg/dL (ref 8.9–10.3)
GFR calc Af Amer: >60 mL/min (ref >60)
GFR calc non Af Amer: >60 mL/min (ref >60)
GLUCOSE: 108 mg/dL — AB (ref 65–99)
Potassium: 3.7 mmol/L (ref 3.5–5.1)
Sodium: 140 mmol/L (ref 135–145)

## 2018-01-30 MED ORDER — POTASSIUM CHLORIDE CRYS ER 20 MEQ PO TBCR
40.0000 meq | EXTENDED_RELEASE_TABLET | Freq: Once | ORAL | Status: AC
  Administered 2018-01-30: 40 meq via ORAL
  Filled 2018-01-30: qty 2

## 2018-01-30 MED ORDER — CARVEDILOL 12.5 MG PO TABS
12.5000 mg | ORAL_TABLET | Freq: Two times a day (BID) | ORAL | Status: DC
  Administered 2018-01-30 – 2018-01-31 (×3): 12.5 mg via ORAL
  Filled 2018-01-30 (×3): qty 1

## 2018-01-30 NOTE — Progress Notes (Signed)
Central monitoring advised patient experienced 13 beats of SVT rate in 170s. Pt. Is asymptomatic will continue to monitor.

## 2018-01-30 NOTE — Progress Notes (Signed)
Pt ambulated unit independently with son

## 2018-01-30 NOTE — Progress Notes (Addendum)
[address]       [place] [address]             [phone]      5 Days Post-Op Procedure(s) (LRB): CORONARY ARTERY BYPASS GRAFTING (CABG), ON PUMP, TIMES FOUR, USING LEFT INTERNAL MAMMARY ARTERY AND ENDOSCOPICALLY HARVESTED RIGHT GREATER SAPHENOUS VEIN (N/A) TRANSESOPHAGEAL ECHOCARDIOGRAM (TEE) (N/A) Subjective: Has had a couple episodes of SVT, asymptomatic, feels well  Objective: Vital signs in last 24 hours: Temp:  [97.6 F (36.4 C)-98.3 F (36.8 C)] 97.8 F (36.6 C) (03/02 0454) Pulse Rate:  [72-86] 86 (03/02 0454) Cardiac Rhythm: Supraventricular tachycardia (03/02 0518) Resp:  [20-38] 22 (03/02 0454) BP: (124-150)/(54-74) 147/58 (03/02 0454) SpO2:  [94 %-97 %] 97 % (03/02 0454) Weight:  [158 lb 8 oz (71.9 kg)] 158 lb 8 oz (71.9 kg) (03/02 0454)  Hemodynamic parameters for last 24 hours:    Intake/Output from previous day: 03/01 0701 - 03/02 0700 In: 200 [P.O.:200] Out: -  Intake/Output this shift: No intake/output data recorded.  General appearance: alert, cooperative and no distress Heart: regular rate and rhythm Lungs: clear to auscultation bilaterally Abdomen: benign Extremities: + LE edema Wound: incis healing well  Lab Results: Recent Labs    01/28/18 0530 01/29/18 0250  WBC 11.2* 10.8*  HGB 7.2* 9.6*  HCT 22.3* 28.9*  PLT 111* 154   BMET:  Recent Labs    01/29/18 0250 01/30/18 0402  NA 137 140  K 3.8 3.7  CL 98* 99*  CO2 26 28  GLUCOSE 109* 108*  BUN 20 18  CREATININE 1.02 0.91  CALCIUM 8.8* 9.0    PT/INR: No results for input(s): LABPROT, INR in the last 72 hours. ABG    Component Value Date/Time   PHART 7.304 (L) 01/25/2018 1830   HCO3 24.9 01/25/2018 1830   TCO2 26 01/26/2018 1714   ACIDBASEDEF 2.0 01/25/2018 1830   O2SAT 62.9 01/28/2018 0540   CBG (last 3)  Recent Labs    01/27/18 0817 01/27/18 1138  GLUCAP 104* 116*    Meds Scheduled Meds: . acetaminophen  1,000 mg Oral Q6H   Or  .  acetaminophen (TYLENOL) oral liquid 160 mg/5 mL  1,000 mg Per Tube Q6H  . aspirin EC  325 mg Oral Daily   Or  . aspirin  324 mg Per Tube Daily  . atorvastatin  80 mg Oral QPM  . bisacodyl  10 mg Oral Daily   Or  . bisacodyl  10 mg Rectal Daily  . carvedilol  6.25 mg Oral BID WC  . docusate sodium  200 mg Oral Daily  . ferrous fumarate-b12-vitamic C-folic acid  1 capsule Oral BID PC  . furosemide  40 mg Intravenous Daily  . lisinopril  10 mg Oral QHS  . mouth rinse  15 mL Mouth Rinse BID  . metoCLOPramide (REGLAN) injection  10 mg Intravenous Q6H  . pantoprazole  40 mg Oral Daily  . sodium chloride flush  3 mL Intravenous Q12H  . sodium chloride flush  3 mL Intravenous Q12H  . spironolactone  25 mg Oral Daily   Continuous Infusions: . sodium chloride Stopped (01/26/18 0956)  . sodium chloride Stopped (01/26/18 1015)  . sodium chloride Stopped (01/26/18 0956)  . sodium chloride    . lactated ringers Stopped (01/25/18 1330)  . lactated ringers Stopped (01/28/18 0800)   PRN Meds:.sodium chloride, sodium chloride, magnesium hydroxide, metoprolol tartrate, ondansetron (ZOFRAN) IV, oxyCODONE, sodium chloride flush, sodium chloride  flush, traMADol  Xrays Dg Chest 2 View  Result Date: 01/29/2018 CLINICAL DATA:  Pneumothorax EXAM: CHEST  2 VIEW COMPARISON:  January 28, 2018 FINDINGS: There remains a small pneumothorax on the right without tension component. Small right effusion no longer evident. There is a small left pleural effusion. There is bibasilar atelectasis. Heart is upper normal in size with pulmonary vascularity within normal limits. No adenopathy. Temporary pacemaker wires are attached to the right heart. There is aortic atherosclerosis. Patient is status post coronary artery bypass grafting. Central catheter no longer present. IMPRESSION: Persistent small right apical pneumothorax without tension component. Bibasilar atelectasis. Small left pleural effusion. Small right pleural  effusion no longer evident. Stable cardiac silhouette. There is aortic atherosclerosis. Aortic Atherosclerosis (ICD10-I70.0). Electronically Signed   By: Bretta Bang III M.D.   On: 01/29/2018 07:19    Assessment/Plan: S/P Procedure(s) (LRB): CORONARY ARTERY BYPASS GRAFTING (CABG), ON PUMP, TIMES FOUR, USING LEFT INTERNAL MAMMARY ARTERY AND ENDOSCOPICALLY HARVESTED RIGHT GREATER SAPHENOUS VEIN (N/A) TRANSESOPHAGEAL ECHOCARDIOGRAM (TEE) (N/A)  1 episodes of asympt SVT, EF 40-45, K= 3.7 today- will replace. QTc 480-500. BP is reasonably controlled 120-150 range systolic- will increase beta blocker dose for now 2 cont gentle diuresis for volume overload 3 sugar control is good 4 cont routine pulm toilet and rehab 5 poss home in next 24-48 hours    LOS: 5 days    Rowe Clack 01/30/2018 Patient seen and examined, agree with above  Viviann Spare C. Dorris Fetch, MD [facility] [phone]

## 2018-01-30 NOTE — Progress Notes (Signed)
CARDIAC REHAB PHASE I   PRE:  Rate/Rhythm: 78 sr  BP:  Sitting: 135/65      SaO2: 97 ra  MODE:  Ambulation: 470 ft   POST:  Rate/Rhythm: 88 sr  BP:  Sitting: 143/69     SaO2: 96 ra  1030-1050 Patient ambulated in hallway independently. Slow steady gait noted. Patient denied need for additional education. No complaints noted. Post ambulation patient back to chair with call bell and phone in reach. Patient encouraged to ambulate 2 more times today and 3 times daily.  Jerrico Covello English PayneRN, BSN 01/30/2018 10:53 AM

## 2018-01-31 MED ORDER — ASPIRIN 325 MG PO TBEC: 325.0000 mg | DELAYED_RELEASE_TABLET | Freq: Every day | ORAL | Status: DC

## 2018-01-31 MED ORDER — CARVEDILOL 12.5 MG PO TABS: 12.5000 mg | ORAL_TABLET | Freq: Two times a day (BID) | ORAL | 1 refills | Status: DC

## 2018-01-31 MED ORDER — LISINOPRIL 10 MG PO TABS: 20.0000 mg | ORAL_TABLET | Freq: Every day | ORAL | Status: DC

## 2018-01-31 MED ORDER — LISINOPRIL 20 MG PO TABS: 20.0000 mg | ORAL_TABLET | Freq: Every day | ORAL | 1 refills | Status: DC

## 2018-01-31 MED ORDER — OXYCODONE HCL 5 MG PO TABS: 5.0000 mg | ORAL_TABLET | Freq: Four times a day (QID) | ORAL | 0 refills | Status: DC | PRN

## 2018-01-31 NOTE — Progress Notes (Signed)
      [address]       [place] [address]             [phone]      6 Days Post-Op Procedure(s) (LRB): CORONARY ARTERY BYPASS GRAFTING (CABG), ON PUMP, TIMES FOUR, USING LEFT INTERNAL MAMMARY ARTERY AND ENDOSCOPICALLY HARVESTED RIGHT GREATER SAPHENOUS VEIN (N/A) TRANSESOPHAGEAL ECHOCARDIOGRAM (TEE) (N/A) Subjective: Feels well, no further SVT episodes, some PVC's  Objective: Vital signs in last 24 hours: Temp:  [97.7 F (36.5 C)-98 F (36.7 C)] 97.7 F (36.5 C) (03/03 0318) Pulse Rate:  [73-89] 73 (03/03 0500) Cardiac Rhythm: Normal sinus rhythm;Bundle branch block (03/03 0704) Resp:  [18-36] 20 (03/03 0500) BP: (106-152)/(60-69) 131/63 (03/03 0318) SpO2:  [94 %-99 %] 99 % (03/03 0500) Weight:  [156 lb 12.8 oz (71.1 kg)] 156 lb 12.8 oz (71.1 kg) (03/03 0500)  Hemodynamic parameters for last 24 hours:    Intake/Output from previous day: 03/02 0701 - 03/03 0700 In: 400 [P.O.:400] Out: -  Intake/Output this shift: No intake/output data recorded.  General appearance: alert, cooperative and no distress Heart: regular rate and rhythm Lungs: clear to auscultation bilaterally Abdomen: benign Extremities: min edema Wound: incis healing well  Lab Results: Recent Labs    01/29/18 0250  WBC 10.8*  HGB 9.6*  HCT 28.9*  PLT 154   BMET:  Recent Labs    01/29/18 0250 01/30/18 0402  NA 137 140  K 3.8 3.7  CL 98* 99*  CO2 26 28  GLUCOSE 109* 108*  BUN 20 18  CREATININE 1.02 0.91  CALCIUM 8.8* 9.0    PT/INR: No results for input(s): LABPROT, INR in the last 72 hours. ABG    Component Value Date/Time   PHART 7.304 (L) 01/25/2018 1830   HCO3 24.9 01/25/2018 1830   TCO2 26 01/26/2018 1714   ACIDBASEDEF 2.0 01/25/2018 1830   O2SAT 62.9 01/28/2018 0540   CBG (last 3)  No results for input(s): GLUCAP in the last 72 hours.  Meds Scheduled Meds: . aspirin EC  325 mg Oral Daily   Or  . aspirin  324 mg Per Tube Daily  . atorvastatin  80 mg  Oral QPM  . bisacodyl  10 mg Oral Daily   Or  . bisacodyl  10 mg Rectal Daily  . carvedilol  12.5 mg Oral BID WC  . docusate sodium  200 mg Oral Daily  . ferrous fumarate-b12-vitamic C-folic acid  1 capsule Oral BID PC  . furosemide  40 mg Intravenous Daily  . lisinopril  10 mg Oral QHS  . mouth rinse  15 mL Mouth Rinse BID  . pantoprazole  40 mg Oral Daily  . sodium chloride flush  3 mL Intravenous Q12H  . spironolactone  25 mg Oral Daily   Continuous Infusions: . sodium chloride Stopped (01/26/18 0956)  . lactated ringers Stopped (01/25/18 1330)  . lactated ringers Stopped (01/28/18 0800)   PRN Meds:.magnesium hydroxide, metoprolol tartrate, ondansetron (ZOFRAN) IV, oxyCODONE, sodium chloride flush, traMADol  Xrays No results found.  Assessment/Plan: S/P Procedure(s) (LRB): CORONARY ARTERY BYPASS GRAFTING (CABG), ON PUMP, TIMES FOUR, USING LEFT INTERNAL MAMMARY ARTERY AND ENDOSCOPICALLY HARVESTED RIGHT GREATER SAPHENOUS VEIN (N/A) TRANSESOPHAGEAL ECHOCARDIOGRAM (TEE) (N/A) d/c pacing wires Plan for discharge: see discharge orders BP is up some - will increase lisinopril dose Sugars controlled Below preop wt after good diuresis   LOS: 6 days    Rowe Clack 01/31/2018

## 2018-01-31 NOTE — Progress Notes (Signed)
Patient provided discharge instructions with prescriptions, questions answered, belongings included clothing given to patient at discharge.  Pt discharged home with son.

## 2018-02-01 LAB — TYPE AND SCREEN
ABO/RH(D): A NEG
Antibody Screen: NEGATIVE
Unit division: 0
Unit division: 0
Unit division: 0
Unit division: 0

## 2018-02-01 LAB — BPAM RBC
Blood Product Expiration Date: 201903022359
Blood Product Expiration Date: 201903052359
Blood Product Expiration Date: 201903102359
Blood Product Expiration Date: 201903132359
ISSUE DATE / TIME: 201902280824
ISSUE DATE / TIME: 201902281115
ISSUE DATE / TIME: 201902281424
Unit Type and Rh: 600
Unit Type and Rh: 600
Unit Type and Rh: 600
Unit Type and Rh: 600

## 2018-02-16 ENCOUNTER — Encounter: Payer: Self-pay | Admitting: Physician Assistant

## 2018-02-16 ENCOUNTER — Ambulatory Visit (INDEPENDENT_AMBULATORY_CARE_PROVIDER_SITE_OTHER): Payer: Medicaid Other | Admitting: Physician Assistant

## 2018-02-16 VITALS — BP 140/80 | HR 89 | Ht 66.0 in | Wt 150.6 lb

## 2018-02-16 DIAGNOSIS — E785 Hyperlipidemia, unspecified: Secondary | ICD-10-CM

## 2018-02-16 DIAGNOSIS — I2510 Atherosclerotic heart disease of native coronary artery without angina pectoris: Secondary | ICD-10-CM

## 2018-02-16 DIAGNOSIS — I5042 Chronic combined systolic (congestive) and diastolic (congestive) heart failure: Secondary | ICD-10-CM

## 2018-02-16 DIAGNOSIS — I6523 Occlusion and stenosis of bilateral carotid arteries: Secondary | ICD-10-CM | POA: Diagnosis not present

## 2018-02-16 LAB — COMPREHENSIVE METABOLIC PANEL
A/G RATIO: 1.4 (ref 1.2–2.2)
ALBUMIN: 4 g/dL (ref 3.6–4.8)
ALT: 18 IU/L (ref 0–44)
AST: 15 IU/L (ref 0–40)
Alkaline Phosphatase: 124 IU/L — ABNORMAL HIGH (ref 39–117)
BILIRUBIN TOTAL: 0.4 mg/dL (ref 0.0–1.2)
BUN / CREAT RATIO: 25 — AB (ref 10–24)
BUN: 29 mg/dL — AB (ref 8–27)
CHLORIDE: 99 mmol/L (ref 96–106)
CO2: 23 mmol/L (ref 20–29)
Calcium: 9.8 mg/dL (ref 8.6–10.2)
Creatinine, Ser: 1.17 mg/dL (ref 0.76–1.27)
GFR calc non Af Amer: 67 mL/min/{1.73_m2} (ref >59)
GFR, EST AFRICAN AMERICAN: 77 mL/min/{1.73_m2} (ref >59)
Globulin, Total: 2.9 g/dL (ref 1.5–4.5)
Glucose: 97 mg/dL (ref 65–99)
Potassium: 4.7 mmol/L (ref 3.5–5.2)
Sodium: 139 mmol/L (ref 134–144)
TOTAL PROTEIN: 6.9 g/dL (ref 6.0–8.5)

## 2018-02-16 LAB — CBC
HEMATOCRIT: 33.4 % — AB (ref 37.5–51.0)
Hemoglobin: 11.6 g/dL — ABNORMAL LOW (ref 13.0–17.7)
MCH: 32 pg (ref 26.6–33.0)
MCHC: 34.7 g/dL (ref 31.5–35.7)
MCV: 92 fL (ref 79–97)
Platelets: 417 10*3/uL — ABNORMAL HIGH (ref 150–379)
RBC: 3.62 x10E6/uL — ABNORMAL LOW (ref 4.14–5.80)
RDW: 13.4 % (ref 12.3–15.4)
WBC: 12.8 10*3/uL — ABNORMAL HIGH (ref 3.4–10.8)

## 2018-02-16 LAB — LIPID PANEL
CHOL/HDL RATIO: 3.2 ratio (ref 0.0–5.0)
Cholesterol, Total: 124 mg/dL (ref 100–199)
HDL: 39 mg/dL — ABNORMAL LOW (ref >39)
LDL Calculated: 58 mg/dL (ref 0–99)
TRIGLYCERIDES: 136 mg/dL (ref 0–149)
VLDL Cholesterol Cal: 27 mg/dL (ref 5–40)

## 2018-02-16 NOTE — Patient Instructions (Addendum)
Medication Instructions:  Your physician recommends that you continue on your current medications as directed. Please refer to the Current Medication list given to you today.   Labwork: TODAY:  CMET, CBC, & LIPID  Testing/Procedures: None ordered  Follow-Up: Your physician recommends that you schedule a follow-up appointment in: 2-3 WEEKS WITH DR. Dulce Sellar   Any Other Special Instructions Will Be Listed Below (If Applicable).     If you need a refill on your cardiac medications before your next appointment, please call your pharmacy.

## 2018-02-16 NOTE — Progress Notes (Signed)
Cardiology Office Note    Date:  02/16/2018  ID:  Lance Ayala, DOB 1956-06-02, MRN [id number] PCP:  Anselmo Pickler, MD  Cardiologist:  Dr. Dulce Sellar   Chief Complaint: f/u CABG.  History of Present Illness:  Lance Ayala is a 62 y.o. male with history of CAD s/p remote PCI and recent CABG, chronic combined CHF, carotid artery disease (R CEA 12/2017, L 60-79%, LBBB, dyslipidemia, HTN, lung nodule, COPD, TIA 2000 who present for post-CABG follow-up.  He has a history of PCI many years ago. He was evaluated this fall for fatigue and poor exercise tolerance at which time he was found to have multivessel disease on cath 10/29/17 with LVEF 25-35%. 2D echo 11/27/18 showed EF 35-45% with diffuse HK, grade 1 DD, trivial MR, aortic valve sclerosis without stenosis, no mural thrombus. I do not have access to remote EF. Peripheral to this he was found to have significant carotid disease. He underwent R CEA 12/28/17 and then after recovery from that, was brought back for elective CABG x4 on 01/25/18 with LIMA to LAD, SVG to PDA, and Sequential SVG to OM1 and OM2. He was discharged on carvedilol, lisinopril and spironolactone which he was on prior to CABG. Post-hospital course seemed relatively uncomplicated. He maintained NSR after discharge. Last labs otherwise showed K 3.7, Cr 0.91, Hgb 9.6, plt 154, A1C 5.6, LFTs OK. No recent lipids on file. Of note, CT chest 10/2017 showed at least 2 pulm nodules on the right, the larger of which is unchanged, consider f/u CT 18-24 months for low risk patients, recommended for high risk patients, slight increase in L axillary/mediastinal adenopathy, increased since previous CT, still nonspecific and could be reactive, correlation with PET CT could be considered.  He presents for follow-up today feeling good. He's felt better day by day. Endurance is improving. He has not had any chest pain, only generalized soreness. No further orthopnea, dyspnea, or DOE. No LEE or  palpitations. He does report mild dizziness upon standing at times but it is not severe. He usually just stands there for a moment then gets moving. He did not take his meds yet today due to the appointment time/drive here.    Past Medical History:  Diagnosis Date  . CAD (coronary artery disease)    a. remote PCI. b. CABG x 4 01/2018.  . Carotid artery disease (HCC)    a. s/p R CEA 12/2017 - followed by vascular.  . Cholelithiasis   . Chronic combined systolic and diastolic CHF (congestive heart failure) (HCC)    a. LVEF 35-40% in 10/2017.  Marland Kitchen COPD (chronic obstructive pulmonary disease) (HCC) 10/21/2017  . Dyslipidemia   . Hypertension 10/21/2017  . LBBB (left bundle branch block)   . Lung nodule    a. lung nodules/mediastinal/axillary lymphadenopathy by CT 10/2017.  Marland Kitchen Myocardial infarction (HCC) 10/21/2017  . TIA (transient ischemic attack)    " Mini stroke in 2000"  . Wears dentures     Past Surgical History:  Procedure Laterality Date  . CORONARY ANGIOPLASTY WITH STENT PLACEMENT     2 stents  . CORONARY ARTERY BYPASS GRAFT N/A 01/25/2018   Procedure: CORONARY ARTERY BYPASS GRAFTING (CABG), ON PUMP, TIMES FOUR, USING LEFT INTERNAL MAMMARY ARTERY AND ENDOSCOPICALLY HARVESTED RIGHT GREATER SAPHENOUS VEIN;  Surgeon: Kerin Perna, MD;  Location: [facility] OR;  Service: Open Heart Surgery;  Laterality: N/A;  . ENDARTERECTOMY Right 12/28/2017   Procedure: RIGHT CAROTID ENDARTERECTOMY;  Surgeon: [name], MD;  Location: [facility]  OR;  Service: Vascular;  Laterality: Right;  . LEFT HEART CATH AND CORONARY ANGIOGRAPHY N/A 10/29/2017   Procedure: LEFT HEART CATH AND CORONARY ANGIOGRAPHY;  Surgeon: Tonny Bollman, MD;  Location: [facility] INVASIVE CV LAB;  Service: Cardiovascular;  Laterality: N/A;  . MULTIPLE TOOTH EXTRACTIONS    . PATCH ANGIOPLASTY Right 12/28/2017   Procedure: WITH  HEMASHIELD  0.3 X 3 IN  USE AS PATCH ANGIOPLASTY;  Surgeon: [name], MD;  Location: [facility] OR;  Service: Vascular;   Laterality: Right;  . TEE WITHOUT CARDIOVERSION N/A 01/25/2018   Procedure: TRANSESOPHAGEAL ECHOCARDIOGRAM (TEE);  Surgeon: Donata Clay, Theron Arista, MD;  Location: [facility] OR;  Service: Open Heart Surgery;  Laterality: N/A;    Current Medications: Current Meds  Medication Sig  . aspirin EC 325 MG EC tablet Take 1 tablet (325 mg total) by mouth daily.  Marland Kitchen atorvastatin (LIPITOR) 80 MG tablet Take 1 tablet (80 mg total) by mouth every evening.  . carvedilol (COREG) 12.5 MG tablet Take 1 tablet (12.5 mg total) by mouth 2 (two) times daily with a meal.  . lisinopril (PRINIVIL,ZESTRIL) 20 MG tablet Take 1 tablet (20 mg total) by mouth at bedtime.  Marland Kitchen oxyCODONE (OXY IR/ROXICODONE) 5 MG immediate release tablet Take 1-2 tablets (5-10 mg total) by mouth every 6 (six) hours as needed for severe pain.  Marland Kitchen spironolactone (ALDACTONE) 25 MG tablet TAKE ONE TABLET BY MOUTH EVERY DAY    Allergies:   Patient has no known allergies.   Social History   Socioeconomic History  . Marital status: Single    Spouse name: None  . Number of children: None  . Years of education: None  . Highest education level: None  Social Needs  . Financial resource strain: None  . Food insecurity - worry: None  . Food insecurity - inability: None  . Transportation needs - medical: None  . Transportation needs - non-medical: None  Occupational History  . None  Tobacco Use  . Smoking status: Former Smoker    Packs/day: 1.00    Years: 20.00    Pack years: 20.00    Last attempt to quit: 12/01/1998    Years since quitting: 19.2  . Smokeless tobacco: Never Used  Substance and Sexual Activity  . Alcohol use: No    Frequency: Never  . Drug use: No  . Sexual activity: None  Other Topics Concern  . None  Social History Narrative  . None     Family History:  Family History  Problem Relation Age of Onset  . Hypertension Mother   . Stroke Mother   . Heart attack Father   . Lung disease Brother     ROS:   Please see the  history of present illness.  All other systems are reviewed and otherwise negative.    PHYSICAL EXAM:   VS:  BP 140/80 (BP Location: Left Arm, Patient Position: Sitting, Cuff Size: Normal)   Pulse 89   Ht 5\' 6"  (1.676 m)   Wt 150 lb 9.6 oz (68.3 kg)   SpO2 97%   BMI 24.31 kg/m   BMI: Body mass index is 24.31 kg/m. GEN: Well nourished, well developed WM, in no acute distress  HEENT: normocephalic, atraumatic Neck: + L carotid bruit. + R carotid surgical scar well healed Cardiac: RRR; no murmurs, rubs, or gallops, no edema  Respiratory:  Decreased BS throughout but no wheezes/rales/rhonchi, normal work of breathing GI: soft, nontender, nondistended, + BS MS: no deformity or atrophy  Skin:  warm and dry, no rash. Surgical sites on sternum and RLE c/d/i without suppuration Neuro:  Alert and Oriented x 3, Strength and sensation are intact, follows commands Psych: euthymic mood, full affect  Wt Readings from Last 3 Encounters:  02/16/18 150 lb 9.6 oz (68.3 kg)  01/31/18 156 lb 12.8 oz (71.1 kg)  01/21/18 160 lb (72.6 kg)      Studies/Labs Reviewed:   EKG:  EKG was ordered today and personally reviewed by me and demonstrates NSR 77bpm occasional PACs, LBBB  Recent Labs: 11/13/2017: NT-Pro BNP 614 01/21/2018: ALT 22 01/26/2018: Magnesium 2.3 01/29/2018: Hemoglobin 9.6; Platelets 154 01/30/2018: BUN 18; Creatinine, Ser 0.91; Potassium 3.7; Sodium 140   Lipid Panel No results found for: CHOL, TRIG, HDL, CHOLHDL, VLDL, LDLCALC, LDLDIRECT  Additional studies/ records that were reviewed today include: Summarized above.    ASSESSMENT & PLAN:   1. CAD - doing very well s/p CABG. Continue ASA. Will defer reduction in aspirin dose to surgical team (consider decrease to 81mg  daily when felt appropriate). Continue beta blocker and statin. 2. Chronic combined CHF - Appears euvolemic. Reviewed 2g sodium restriction, 2L fluid restriction, daily weights with patient. His BP was initially  100/50 by tech but recheck 140/80 by me. He states he has not taken his AM BP meds yet. He does have brief mild orthostatic dizziness upon standing at times but he states this is infrequent and not lifestyle limiting. I would like him to f/u with Dr. Dulce Sellar in a few weeks to discuss consideration of transition from lisinopril to [facility]. I don't think we can make this change today since it's not clear where his blood pressure is when he has taken all his meds on time. Could also consider repeat limited echo to re-eval EF for any improvement before making this change. Will check lytes/Cr/blood count today to trend from recent DC. 3. Carotid artery disease - followed by vascular surgery. 4. Dyslipidemia - no recent lipid profile on file. Check CMET/lipids today. 5. Lung nodule - informed him of this result; he was aware. I have recommended he review further with his PCP.  Disposition: F/u with Dr. Dulce Sellar in 2-3 weeks. He has f/u with TCTS as well. He inquires about pain med refill. It has traditionally been our office's stance that all pain medication should come through the same office it was prescribed through, so I asked him to touch base with the surgeon's team.   Medication Adjustments/Labs and Tests Ordered: Current medicines are reviewed at length with the patient today.  Concerns regarding medicines are outlined above. Medication changes, Labs and Tests ordered today are summarized above and listed in the Patient Instructions accessible in Encounters.   Signed, Laurann Montana, PA-C  02/16/2018 11:05 AM    [facility] [address] [place], [place], [place]  [address] Phone: [phone]; Fax: [phone]

## 2018-02-17 ENCOUNTER — Telehealth: Payer: Self-pay | Admitting: Cardiology

## 2018-02-17 NOTE — Telephone Encounter (Signed)
Lance Ayala is returning a call about his lab results . Please call

## 2018-02-17 NOTE — Telephone Encounter (Signed)
Clarene Duke, CMA, returned call to pt. See result note.

## 2018-02-17 NOTE — Telephone Encounter (Signed)
Routing to [employer] team to advise.

## 2018-02-26 DIAGNOSIS — I5042 Chronic combined systolic (congestive) and diastolic (congestive) heart failure: Secondary | ICD-10-CM | POA: Insufficient documentation

## 2018-02-26 DIAGNOSIS — K8020 Calculus of gallbladder without cholecystitis without obstruction: Secondary | ICD-10-CM | POA: Insufficient documentation

## 2018-02-26 DIAGNOSIS — R911 Solitary pulmonary nodule: Secondary | ICD-10-CM | POA: Insufficient documentation

## 2018-02-26 DIAGNOSIS — G459 Transient cerebral ischemic attack, unspecified: Secondary | ICD-10-CM | POA: Insufficient documentation

## 2018-03-03 ENCOUNTER — Ambulatory Visit: Payer: Self-pay | Admitting: Cardiothoracic Surgery

## 2018-03-08 ENCOUNTER — Ambulatory Visit: Payer: Medicaid Other | Admitting: Cardiology

## 2018-03-09 ENCOUNTER — Other Ambulatory Visit: Payer: Self-pay | Admitting: Cardiothoracic Surgery

## 2018-03-09 DIAGNOSIS — Z951 Presence of aortocoronary bypass graft: Secondary | ICD-10-CM

## 2018-03-10 ENCOUNTER — Ambulatory Visit (INDEPENDENT_AMBULATORY_CARE_PROVIDER_SITE_OTHER): Payer: Self-pay | Admitting: Cardiothoracic Surgery

## 2018-03-10 ENCOUNTER — Encounter: Payer: Self-pay | Admitting: Cardiothoracic Surgery

## 2018-03-10 ENCOUNTER — Ambulatory Visit
Admission: RE | Admit: 2018-03-10 | Discharge: 2018-03-10 | Disposition: A | Discharge status: Home / Self Care | Payer: Medicaid Other | Source: Ambulatory Visit | Attending: Cardiothoracic Surgery | Admitting: Cardiothoracic Surgery

## 2018-03-10 VITALS — BP 94/66 | HR 86 | Resp 20 | Ht 66.0 in | Wt 153.0 lb

## 2018-03-10 DIAGNOSIS — Z951 Presence of aortocoronary bypass graft: Secondary | ICD-10-CM

## 2018-03-10 DIAGNOSIS — J90 Pleural effusion, not elsewhere classified: Secondary | ICD-10-CM

## 2018-03-10 DIAGNOSIS — I2510 Atherosclerotic heart disease of native coronary artery without angina pectoris: Secondary | ICD-10-CM

## 2018-03-10 NOTE — Progress Notes (Signed)
PCP is [name], [name], MD Referring Provider is Tonny Bollman, MD  Chief Complaint  Patient presents with  . Routine Post Op    2 month f/u with CXR    HPI: The patient returns for scheduled follow-up after multivessel CABG 6 weeks ago.  He has done well.  No recurrent angina.  Surgical incisions healing well.  Postop chest x-ray performed today personally reviewed showing clear lung fields no pleural effusion.  Patient is ready to start outpatient rehab at [facility].  He may drive and is limited to lift up to 20 pounds maximum until 3 months after surgery.  He should continue his current medication until he checks back with his primary cardiologist Dr. Dulce Sellar  Past Medical History:  Diagnosis Date  . CAD (coronary artery disease)    a. remote PCI. b. CABG x 4 01/2018.  . Carotid artery disease (HCC)    a. s/p R CEA 12/2017 - followed by vascular.  . Cholelithiasis   . Chronic combined systolic and diastolic CHF (congestive heart failure) (HCC)    a. LVEF 35-40% in 10/2017.  Marland Kitchen COPD (chronic obstructive pulmonary disease) (HCC) 10/21/2017  . Dyslipidemia   . Hypertension 10/21/2017  . LBBB (left bundle branch block)   . Lung nodule    a. lung nodules/mediastinal/axillary lymphadenopathy by CT 10/2017.  Marland Kitchen Myocardial infarction (HCC) 10/21/2017  . TIA (transient ischemic attack)    " Mini stroke in 2000"  . Wears dentures     Past Surgical History:  Procedure Laterality Date  . CORONARY ANGIOPLASTY WITH STENT PLACEMENT     2 stents  . CORONARY ARTERY BYPASS GRAFT N/A 01/25/2018   Procedure: CORONARY ARTERY BYPASS GRAFTING (CABG), ON PUMP, TIMES FOUR, USING LEFT INTERNAL MAMMARY ARTERY AND ENDOSCOPICALLY HARVESTED RIGHT GREATER SAPHENOUS VEIN;  Surgeon: Kerin Perna, MD;  Location: [facility] OR;  Service: Open Heart Surgery;  Laterality: N/A;  . ENDARTERECTOMY Right 12/28/2017   Procedure: RIGHT CAROTID ENDARTERECTOMY;  Surgeon: [name], MD;  Location: [facility] OR;   Service: Vascular;  Laterality: Right;  . LEFT HEART CATH AND CORONARY ANGIOGRAPHY N/A 10/29/2017   Procedure: LEFT HEART CATH AND CORONARY ANGIOGRAPHY;  Surgeon: Tonny Bollman, MD;  Location: [facility] INVASIVE CV LAB;  Service: Cardiovascular;  Laterality: N/A;  . MULTIPLE TOOTH EXTRACTIONS    . PATCH ANGIOPLASTY Right 12/28/2017   Procedure: WITH  HEMASHIELD  0.3 X 3 IN  USE AS PATCH ANGIOPLASTY;  Surgeon: [name], MD;  Location: [facility] OR;  Service: Vascular;  Laterality: Right;  . TEE WITHOUT CARDIOVERSION N/A 01/25/2018   Procedure: TRANSESOPHAGEAL ECHOCARDIOGRAM (TEE);  Surgeon: Donata Clay, Theron Arista, MD;  Location: [facility] OR;  Service: Open Heart Surgery;  Laterality: N/A;    Family History  Problem Relation Age of Onset  . Hypertension Mother   . Stroke Mother   . Heart attack Father   . Lung disease Brother     Social History Social History   Tobacco Use  . Smoking status: Former Smoker    Packs/day: 1.00    Years: 20.00    Pack years: 20.00    Last attempt to quit: 12/01/1998    Years since quitting: 19.2  . Smokeless tobacco: Never Used  Substance Use Topics  . Alcohol use: No    Frequency: Never  . Drug use: No    Current Outpatient Medications  Medication Sig Dispense Refill  . aspirin EC 325 MG EC tablet Take 1 tablet (325 mg total) by mouth  daily.    . atorvastatin (LIPITOR) 80 MG tablet Take 1 tablet (80 mg total) by mouth every evening. 90 tablet 1  . carvedilol (COREG) 12.5 MG tablet Take 1 tablet (12.5 mg total) by mouth 2 (two) times daily with a meal. 60 tablet 1  . lisinopril (PRINIVIL,ZESTRIL) 20 MG tablet Take 1 tablet (20 mg total) by mouth at bedtime. 30 tablet 1  . spironolactone (ALDACTONE) 25 MG tablet TAKE ONE TABLET BY MOUTH EVERY DAY 90 tablet 3   No current facility-administered medications for this visit.     No Known Allergies  Review of Systems  Initial weight loss now regaining weight with improved appetite No shortness of breath or chest  pain No fever No edema  BP 94/66   Pulse 86   Resp 20   Ht 5\' 6"  (1.676 m)   Wt 153 lb (69.4 kg)   SpO2 96% Comment: RA  BMI 24.69 kg/m  Physical Exam      Exam    General- alert and comfortable    Neck- no JVD, no cervical adenopathy palpable, no carotid bruit   Lungs- clear without rales, wheezes   Cor- regular rate and rhythm, no murmur , gallop   Abdomen- soft, non-tender   Extremities - warm, non-tender, minimal edema   Neuro- oriented, appropriate, no focal weakness   Diagnostic Tests: Chest x-ray clear  Impression: Excellent early recovery 6 weeks after multivessel CABG for severe three-vessel coronary disease with preop moderate LV dysfunction  Plan: Continue current medications.  We will facilitate referral for outpatient cardiac rehab at [facility].  Return as needed.  Mikey Bussing, MD [facility] [phone]

## 2018-03-11 ENCOUNTER — Encounter: Payer: Self-pay | Admitting: *Deleted

## 2018-03-11 NOTE — Progress Notes (Signed)
Mr. Altamimi is s/p CABG 01/25/18. He has been referred to [facility] for Phase II per the request of Dr. Donata Clay. All records were faxed per their referral request.

## 2018-03-17 DIAGNOSIS — Z736 Limitation of activities due to disability: Secondary | ICD-10-CM

## 2018-03-22 NOTE — Progress Notes (Signed)
Cardiology Office Note:    Date:  03/23/2018   ID:  Lance Ayala, DOB 1956/07/19, MRN [id number]  PCP:  Anselmo Pickler, MD  Cardiologist:  Norman Herrlich, MD    Referring MD: Anselmo Pickler, *    ASSESSMENT:    1. Chronic combined systolic and diastolic heart failure (HCC)   2. Coronary artery disease involving native coronary artery of native heart with angina pectoris (HCC)   3. S/P CABG x 4   4. Dyslipidemia   5. Hypertensive heart disease with heart failure (HCC)   6. LBBB (left bundle branch block)    PLAN:    In order of problems listed above:  1. Stable compensated New York Heart Association class I-II he does have hypotension will decrease the dose of medications as described.  With his improvement ejection fraction I continue his current guideline directed medical therapy. 2. Stable markedly improved after bypass surgery he is close to returning to manual labor 3. See above stable he is made a good recovery 4. Stable continue with statin lipids are ideal 5. Decrease dose of MRA beta-blocker and ACE with symptomatic orthostatic hypotension 6. Stable on his post bypass EKG independently reviewed   Next appointment: 4 weeks   Medication Adjustments/Labs and Tests Ordered: Current medicines are reviewed at length with the patient today.  Concerns regarding medicines are outlined above.  No orders of the defined types were placed in this encounter.  No orders of the defined types were placed in this encounter.   No chief complaint on file.   History of Present Illness:    Lance Ayala is a 62 y.o. male with a hx of  heart failure, EF 25% improved to 35-40% by TTE 11/27/18,  Severe multivessel CAD  with moderate distal left main stenosis, severe ramus/OM1 stenoses, severe RCA stenosis, and severe LAD/diagonal stenosis and recent  CABG on 01/25/18 with LIMA to LAD, SVG to PDA, and Sequential SVG to OM1 and OM2 performed after CEA right 12/28/17  last seen  by me 01/12/18.  ASSESSMENT:    01/12/18   1. Coronary artery disease involving native coronary artery of native heart with angina pectoris (HCC)   2 Hypertensive heart disease with heart failure (HCC)   3. Chronic systolic heart failure (HCC)   4. Stenosis of right carotid artery    PLAN:    1. He will continue current medical treatment pending elective bypass surgery he is doing to be seen by CTS in the next week now that he has had successful carotid endarterectomy 2. Stable continue current diuretic beta-blocker ACE inhibitor and MRA pending bypass surgery.  Postoperatively if EF remains reduced I will transition him to ARNI 3. Stable compensated continue current diuretic 4. 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  He is not having sternal pain shortness of breath palpitations syncope but he is lightheaded when he stands and has symptomatic hypotension.  His last ejection fraction by TEE was 45-50%.  Postoperative labs are all normal I will decrease the dose of his MRA beta-blocker and ACE inhibitor by 50%.  I do not think that we need to pursue the issue of an ICD.  I have asked him to see me back in the office in 1 month prior to returning to work he declines cardiac rehabilitation Past Medical History:  Diagnosis Date  . CAD (coronary  artery disease)    a. remote PCI. b. CABG x 4 01/2018.  . Carotid artery disease (HCC)    a. s/p R CEA 12/2017 - followed by vascular.  . Cholelithiasis   . Chronic combined systolic and diastolic CHF (congestive heart failure) (HCC)    a. LVEF 35-40% in 10/2017.  Marland Kitchen COPD (chronic obstructive pulmonary disease) (HCC) 10/21/2017  . Dyslipidemia   . Hypertension 10/21/2017  . LBBB (left bundle branch block)   . Lung nodule    a. lung nodules/mediastinal/axillary lymphadenopathy by CT 10/2017.  Marland Kitchen  Myocardial infarction (HCC) 10/21/2017  . TIA (transient ischemic attack)    " Mini stroke in 2000"  . Wears dentures     Past Surgical History:  Procedure Laterality Date  . CORONARY ANGIOPLASTY WITH STENT PLACEMENT     2 stents  . CORONARY ARTERY BYPASS GRAFT N/A 01/25/2018   Procedure: CORONARY ARTERY BYPASS GRAFTING (CABG), ON PUMP, TIMES FOUR, USING LEFT INTERNAL MAMMARY ARTERY AND ENDOSCOPICALLY HARVESTED RIGHT GREATER SAPHENOUS VEIN;  Surgeon: Kerin Perna, MD;  Location: [facility] OR;  Service: Open Heart Surgery;  Laterality: N/A;  . ENDARTERECTOMY Right 12/28/2017   Procedure: RIGHT CAROTID ENDARTERECTOMY;  Surgeon: [name], MD;  Location: [facility] OR;  Service: Vascular;  Laterality: Right;  . LEFT HEART CATH AND CORONARY ANGIOGRAPHY N/A 10/29/2017   Procedure: LEFT HEART CATH AND CORONARY ANGIOGRAPHY;  Surgeon: Tonny Bollman, MD;  Location: [facility] INVASIVE CV LAB;  Service: Cardiovascular;  Laterality: N/A;  . MULTIPLE TOOTH EXTRACTIONS    . PATCH ANGIOPLASTY Right 12/28/2017   Procedure: WITH  HEMASHIELD  0.3 X 3 IN  USE AS PATCH ANGIOPLASTY;  Surgeon: [name], MD;  Location: [facility] OR;  Service: Vascular;  Laterality: Right;  . TEE WITHOUT CARDIOVERSION N/A 01/25/2018   Procedure: TRANSESOPHAGEAL ECHOCARDIOGRAM (TEE);  Surgeon: Donata Clay, Theron Arista, MD;  Location: [facility] OR;  Service: Open Heart Surgery;  Laterality: N/A;    Current Medications: No outpatient medications have been marked as taking for the 03/23/18 encounter (Appointment) with Baldo Daub, MD.     Allergies:   Patient has no known allergies.   Social History   Socioeconomic History  . Marital status: Single    Spouse name: Not on file  . Number of children: Not on file  . Years of education: Not on file  . Highest education level: Not on file  Occupational History  . Not on file  Social Needs  . Financial resource strain: Not on file  . Food insecurity:    Worry: Not on file    Inability: Not on file  .  Transportation needs:    Medical: Not on file    Non-medical: Not on file  Tobacco Use  . Smoking status: Former Smoker    Packs/day: 1.00    Years: 20.00    Pack years: 20.00    Last attempt to quit: 12/01/1998    Years since quitting: 19.3  . Smokeless tobacco: Never Used  Substance and Sexual Activity  . Alcohol use: No    Frequency: Never  . Drug use: No  . Sexual activity: Not on file  Lifestyle  . Physical activity:    Days per week: Not on file    Minutes per session: Not on file  . Stress: Not on file  Relationships  . Social connections:    Talks on phone: Not on file    Gets together: Not on file    Attends religious service:  Not on file    Active member of club or organization: Not on file    Attends meetings of clubs or organizations: Not on file    Relationship status: Not on file  Other Topics Concern  . Not on file  Social History Narrative  . Not on file     Family History: The patient's family history includes Heart attack in his father; Hypertension in his mother; Lung disease in his brother; Stroke in his mother. ROS:   Please see the history of present illness.    All other systems reviewed and are negative.  EKGs/Labs/Other Studies Reviewed:    The following studies were reviewed today:   CXR 03/10/18: IMPRESSION: COPD. No pneumonia, CHF, pleural effusion, nor other acute cardiopulmonary abnormality. Thoracic aortic atherosclerosis.  Recent Labs: 11/13/2017: NT-Pro BNP 614 01/26/2018: Magnesium 2.3 02/16/2018: ALT 18; BUN 29; Creatinine, Ser 1.17; Hemoglobin 11.6; Platelets 417; Potassium 4.7; Sodium 139  Recent Lipid Panel    Component Value Date/Time   CHOL 124 02/16/2018 1133   TRIG 136 02/16/2018 1133   HDL 39 (L) 02/16/2018 1133   CHOLHDL 3.2 02/16/2018 1133   LDLCALC 58 02/16/2018 1133    Physical Exam:    VS:  There were no vitals taken for this visit.    Wt Readings from Last 3 Encounters:  03/10/18 153 lb (69.4 kg)    02/16/18 150 lb 9.6 oz (68.3 kg)  01/31/18 156 lb 12.8 oz (71.1 kg)     GEN:  Well nourished, well developed in no acute distress HEENT: Normal NECK: No JVD; No carotid bruits LYMPHATICS: No lymphadenopathy CARDIAC: RRR, no murmurs, rubs, gallops RESPIRATORY:  Clear to auscultation without rales, wheezing or rhonchi  ABDOMEN: Soft, non-tender, non-distended MUSCULOSKELETAL:  No edema; No deformity  SKIN: Warm and dry NEUROLOGIC:  Alert and oriented x 3 PSYCHIATRIC:  Normal affect    Signed, Norman Herrlich, MD  03/23/2018 8:09 AM     [facility]

## 2018-03-23 ENCOUNTER — Ambulatory Visit (INDEPENDENT_AMBULATORY_CARE_PROVIDER_SITE_OTHER): Payer: Medicaid Other | Admitting: Cardiology

## 2018-03-23 ENCOUNTER — Encounter: Payer: Self-pay | Admitting: Cardiology

## 2018-03-23 VITALS — BP 86/50 | HR 97 | Ht 66.0 in | Wt 156.0 lb

## 2018-03-23 DIAGNOSIS — I25119 Atherosclerotic heart disease of native coronary artery with unspecified angina pectoris: Secondary | ICD-10-CM | POA: Diagnosis not present

## 2018-03-23 DIAGNOSIS — I110 Hypertensive heart disease with heart failure: Secondary | ICD-10-CM

## 2018-03-23 DIAGNOSIS — E785 Hyperlipidemia, unspecified: Secondary | ICD-10-CM

## 2018-03-23 DIAGNOSIS — I447 Left bundle-branch block, unspecified: Secondary | ICD-10-CM

## 2018-03-23 DIAGNOSIS — I5042 Chronic combined systolic (congestive) and diastolic (congestive) heart failure: Secondary | ICD-10-CM

## 2018-03-23 DIAGNOSIS — Z951 Presence of aortocoronary bypass graft: Secondary | ICD-10-CM

## 2018-03-23 MED ORDER — SPIRONOLACTONE 25 MG PO TABS
12.5000 mg | ORAL_TABLET | Freq: Every day | ORAL | 3 refills | Status: DC
Start: 2018-03-23 — End: 2019-02-01

## 2018-03-23 MED ORDER — LISINOPRIL 20 MG PO TABS: 10.0000 mg | ORAL_TABLET | Freq: Every day | ORAL | 1 refills | Status: DC

## 2018-03-23 MED ORDER — CARVEDILOL 12.5 MG PO TABS: 6.2500 mg | ORAL_TABLET | Freq: Two times a day (BID) | ORAL | 1 refills | Status: DC

## 2018-03-23 NOTE — Patient Instructions (Signed)
Medication Instructions:  Your physician has recommended you make the following change in your medication:  DECREASE aldactone 12.5 mg daily DECREASE lisinopril 10 mg daily DECREASE carvedilol 6.25 mg twice daily  Labwork: None  Testing/Procedures: None  Follow-Up: Your physician recommends that you schedule a follow-up appointment in: 1 month.   Any Other Special Instructions Will Be Listed Below (If Applicable).     If you need a refill on your cardiac medications before your next appointment, please call your pharmacy.

## 2018-04-06 ENCOUNTER — Other Ambulatory Visit: Payer: Self-pay | Admitting: Cardiology

## 2018-04-06 MED ORDER — CARVEDILOL 12.5 MG PO TABS: 6.2500 mg | ORAL_TABLET | Freq: Two times a day (BID) | ORAL | 3 refills | Status: DC

## 2018-04-06 NOTE — Telephone Encounter (Signed)
Refill sent.

## 2018-04-21 NOTE — Progress Notes (Signed)
Cardiology Office Note:    Date:  04/22/2018   ID:  Lance Ayala, DOB 1956-02-14, MRN [id number]  PCP:  Anselmo Pickler, MD  Cardiologist:  Norman Herrlich, MD    Referring MD: Anselmo Pickler, *    ASSESSMENT:    1. Chronic combined systolic and diastolic heart failure (HCC)   2. Hypertensive heart disease with heart failure (HCC)   3. Coronary artery disease involving native coronary artery of native heart with angina pectoris (HCC)   4. Dyslipidemia   5. LBBB (left bundle branch block)    PLAN:    In order of problems listed above:  1. Stable New York Heart Association class I to class II continue current treatment beta-blocker ACE MRA recheck echocardiogram.  If EF is less than 35 we need to consider the merits of ICD therapy as he has been revascularized 2. Stable improved continue current treatment no longer requires a loop diuretic 3. Stable after CABG 4. Continue high intensity statin 5. Await echo if EF is reduced may require biventricular pacemaker cardiac resynchronization and ICD   Next appointment: 3 months   Medication Adjustments/Labs and Tests Ordered: Current medicines are reviewed at length with the patient today.  Concerns regarding medicines are outlined above.  Orders Placed This Encounter  Procedures  . ECHOCARDIOGRAM COMPLETE   No orders of the defined types were placed in this encounter.   Chief Complaint  Patient presents with  . Congestive Heart Failure  . Hypertension  . Coronary Artery Disease  . Hyperlipidemia    History of Present Illness:    Lance Ayala is a 62 y.o. male with a hx of CAD bypass surgery left bundle branch block and systolic heart failure.  Last seen 03/23/18.  ASSESSMENT:    03/23/18   1. Chronic combined systolic and diastolic heart failure (HCC)   2. Coronary artery disease involving native coronary artery of native heart with angina pectoris (HCC)   3. S/P CABG x 4   4. Dyslipidemia   5.  Hypertensive heart disease with heart failure (HCC)   6. LBBB (left bundle branch block)    PLAN:    In order of problems listed above:  1. Stable compensated New York Heart Association class I-II he does have hypotension will decrease the dose of medications as described.  With his improvement ejection fraction I continue his current guideline directed medical therapy. 2. Stable markedly improved after bypass surgery he is close to returning to manual labor 3. See above stable he is made a good recovery 4. Stable continue with statin lipids are ideal 5. Decrease dose of MRA beta-blocker and ACE with symptomatic orthostatic hypotension 6. Stable on his post bypass EKG independently reviewed  Compliance with diet, lifestyle and medications: Yes  His dizziness resolved he has had no angina dyspnea palpitation or syncope he is awaiting a decision on disability.  He feels unable to return to heavy physical labor. Past Medical History:  Diagnosis Date  . CAD (coronary artery disease)    a. remote PCI. b. CABG x 4 01/2018.  . Carotid artery disease (HCC)    a. s/p R CEA 12/2017 - followed by vascular.  . Cholelithiasis   . Chronic combined systolic and diastolic CHF (congestive heart failure) (HCC)    a. LVEF 35-40% in 10/2017.  Marland Kitchen COPD (chronic obstructive pulmonary disease) (HCC) 10/21/2017  . Dyslipidemia   . Hypertension 10/21/2017  . LBBB (left bundle branch block)   . Lung nodule  a. lung nodules/mediastinal/axillary lymphadenopathy by CT 10/2017.  Marland Kitchen Myocardial infarction (HCC) 10/21/2017  . TIA (transient ischemic attack)    " Mini stroke in 2000"  . Wears dentures     Past Surgical History:  Procedure Laterality Date  . CORONARY ANGIOPLASTY WITH STENT PLACEMENT     2 stents  . CORONARY ARTERY BYPASS GRAFT N/A 01/25/2018   Procedure: CORONARY ARTERY BYPASS GRAFTING (CABG), ON PUMP, TIMES FOUR, USING LEFT INTERNAL MAMMARY ARTERY AND ENDOSCOPICALLY HARVESTED RIGHT GREATER  SAPHENOUS VEIN;  Surgeon: Kerin Perna, MD;  Location: [facility] OR;  Service: Open Heart Surgery;  Laterality: N/A;  . ENDARTERECTOMY Right 12/28/2017   Procedure: RIGHT CAROTID ENDARTERECTOMY;  Surgeon: [name], MD;  Location: [facility] OR;  Service: Vascular;  Laterality: Right;  . LEFT HEART CATH AND CORONARY ANGIOGRAPHY N/A 10/29/2017   Procedure: LEFT HEART CATH AND CORONARY ANGIOGRAPHY;  Surgeon: Tonny Bollman, MD;  Location: [facility] INVASIVE CV LAB;  Service: Cardiovascular;  Laterality: N/A;  . MULTIPLE TOOTH EXTRACTIONS    . PATCH ANGIOPLASTY Right 12/28/2017   Procedure: WITH  HEMASHIELD  0.3 X 3 IN  USE AS PATCH ANGIOPLASTY;  Surgeon: [name], MD;  Location: [facility] OR;  Service: Vascular;  Laterality: Right;  . TEE WITHOUT CARDIOVERSION N/A 01/25/2018   Procedure: TRANSESOPHAGEAL ECHOCARDIOGRAM (TEE);  Surgeon: Donata Clay, Theron Arista, MD;  Location[facility] OR;  Service: Open Heart Surgery;  Laterality: N/A;    Current Medications: Current Meds  Medication Sig  . aspirin EC 325 MG EC tablet Take 1 tablet (325 mg total) by mouth daily.  Marland Kitchen atorvastatin (LIPITOR) 80 MG tablet Take 1 tablet (80 mg total) by mouth every evening.  . carvedilol (COREG) 12.5 MG tablet Take 0.5 tablets (6.25 mg total) by mouth 2 (two) times daily with a meal.  . lisinopril (PRINIVIL,ZESTRIL) 20 MG tablet Take 0.5 tablets (10 mg total) by mouth at bedtime.  Marland Kitchen spironolactone (ALDACTONE) 25 MG tablet Take 0.5 tablets (12.5 mg total) by mouth daily.     Allergies:   Patient has no known allergies.   Social History   Socioeconomic History  . Marital status: Single    Spouse name: Not on file  . Number of children: Not on file  . Years of education: Not on file  . Highest education level: Not on file  Occupational History  . Not on file  Social Needs  . Financial resource strain: Not on file  . Food insecurity:    Worry: Not on file    Inability: Not on file  . Transportation needs:    Medical: Not on file     Non-medical: Not on file  Tobacco Use  . Smoking status: Former Smoker    Packs/day: 1.00    Years: 20.00    Pack years: 20.00    Last attempt to quit: 12/01/1998    Years since quitting: 19.4  . Smokeless tobacco: Never Used  Substance and Sexual Activity  . Alcohol use: No    Frequency: Never  . Drug use: No  . Sexual activity: Not on file  Lifestyle  . Physical activity:    Days per week: Not on file    Minutes per session: Not on file  . Stress: Not on file  Relationships  . Social connections:    Talks on phone: Not on file    Gets together: Not on file    Attends religious service: Not on file    Active member of club or  organization: Not on file    Attends meetings of clubs or organizations: Not on file    Relationship status: Not on file  Other Topics Concern  . Not on file  Social History Narrative  . Not on file     Family History: The patient's family history includes Heart attack in his father; Hypertension in his mother; Lung disease in his brother; Stroke in his mother. ROS:   Please see the history of present illness.    All other systems reviewed and are negative.  EKGs/Labs/Other Studies Reviewed:    The following studies were reviewed today:    Recent Labs: recent BNP 185 CMP is normal  11/13/2017: NT-Pro BNP 614 01/26/2018: Magnesium 2.3 02/16/2018: ALT 18; BUN 29; Creatinine, Ser 1.17; Hemoglobin 11.6; Platelets 417; Potassium 4.7; Sodium 139  Recent Lipid Panel    Component Value Date/Time   CHOL 124 02/16/2018 1133   TRIG 136 02/16/2018 1133   HDL 39 (L) 02/16/2018 1133   CHOLHDL 3.2 02/16/2018 1133   LDLCALC 58 02/16/2018 1133    Physical Exam:    VS:  BP 138/78 (BP Location: Left Arm, Patient Position: Sitting, Cuff Size: Normal)   Pulse 74   Ht 5\' 6"  (1.676 m)   Wt 157 lb 1.9 oz (71.3 kg)   SpO2 96%   BMI 25.36 kg/m     Wt Readings from Last 3 Encounters:  04/22/18 157 lb 1.9 oz (71.3 kg)  03/23/18 156 lb (70.8 kg)    03/10/18 153 lb (69.4 kg)     GEN:  Well nourished, well developed in no acute distress HEENT: Normal NECK: No JVD; No carotid bruits LYMPHATICS: No lymphadenopathy CARDIAC: RRR, no murmurs, rubs, gallops RESPIRATORY:  Clear to auscultation without rales, wheezing or rhonchi  ABDOMEN: Soft, non-tender, non-distended MUSCULOSKELETAL:  No edema; No deformity  SKIN: Warm and dry NEUROLOGIC:  Alert and oriented x 3 PSYCHIATRIC:  Normal affect    Signed, Norman Herrlich, MD  04/22/2018 10:56 AM    [facility]

## 2018-04-22 ENCOUNTER — Encounter: Payer: Self-pay | Admitting: Cardiology

## 2018-04-22 ENCOUNTER — Ambulatory Visit (INDEPENDENT_AMBULATORY_CARE_PROVIDER_SITE_OTHER): Payer: Medicaid Other | Admitting: Cardiology

## 2018-04-22 VITALS — BP 138/78 | HR 74 | Ht 66.0 in | Wt 157.1 lb

## 2018-04-22 DIAGNOSIS — I25119 Atherosclerotic heart disease of native coronary artery with unspecified angina pectoris: Secondary | ICD-10-CM

## 2018-04-22 DIAGNOSIS — I5042 Chronic combined systolic (congestive) and diastolic (congestive) heart failure: Secondary | ICD-10-CM

## 2018-04-22 DIAGNOSIS — E785 Hyperlipidemia, unspecified: Secondary | ICD-10-CM | POA: Diagnosis not present

## 2018-04-22 DIAGNOSIS — I110 Hypertensive heart disease with heart failure: Secondary | ICD-10-CM | POA: Diagnosis not present

## 2018-04-22 DIAGNOSIS — I447 Left bundle-branch block, unspecified: Secondary | ICD-10-CM

## 2018-04-22 NOTE — Patient Instructions (Signed)
Medication Instructions:   Your physician recommends that you continue on your current medications as directed. Please refer to the Current Medication list given to you today.   Labwork: NONE  Testing/Procedures: Your physician has requested that you have an echocardiogram. Echocardiography is a painless test that uses sound waves to create images of your heart. It provides your doctor with information about the size and shape of your heart and how well your heart's chambers and valves are working. This procedure takes approximately one hour. There are no restrictions for this procedure.    Follow-Up: Your physician wants you to follow-up in: 3 monts.  You will receive a reminder letter in the mail two months in advance. If you don't receive a letter, please call our office to schedule the follow-up appointment.   Any Other Special Instructions Will Be Listed Below (If Applicable).     If you need a refill on your cardiac medications before your next appointment, please call your pharmacy.

## 2018-05-06 IMAGING — DX DG CHEST 1V PORT
1 series · 1 of 1 positions shown · non-contrast
Comparison: Portable chest x-ray January 25, 2018

CLINICAL DATA: Status post CABG yesterday. Patient is diaphoretic
and short of breath.

EXAM:
PORTABLE CHEST 1 VIEW

[chest ap]
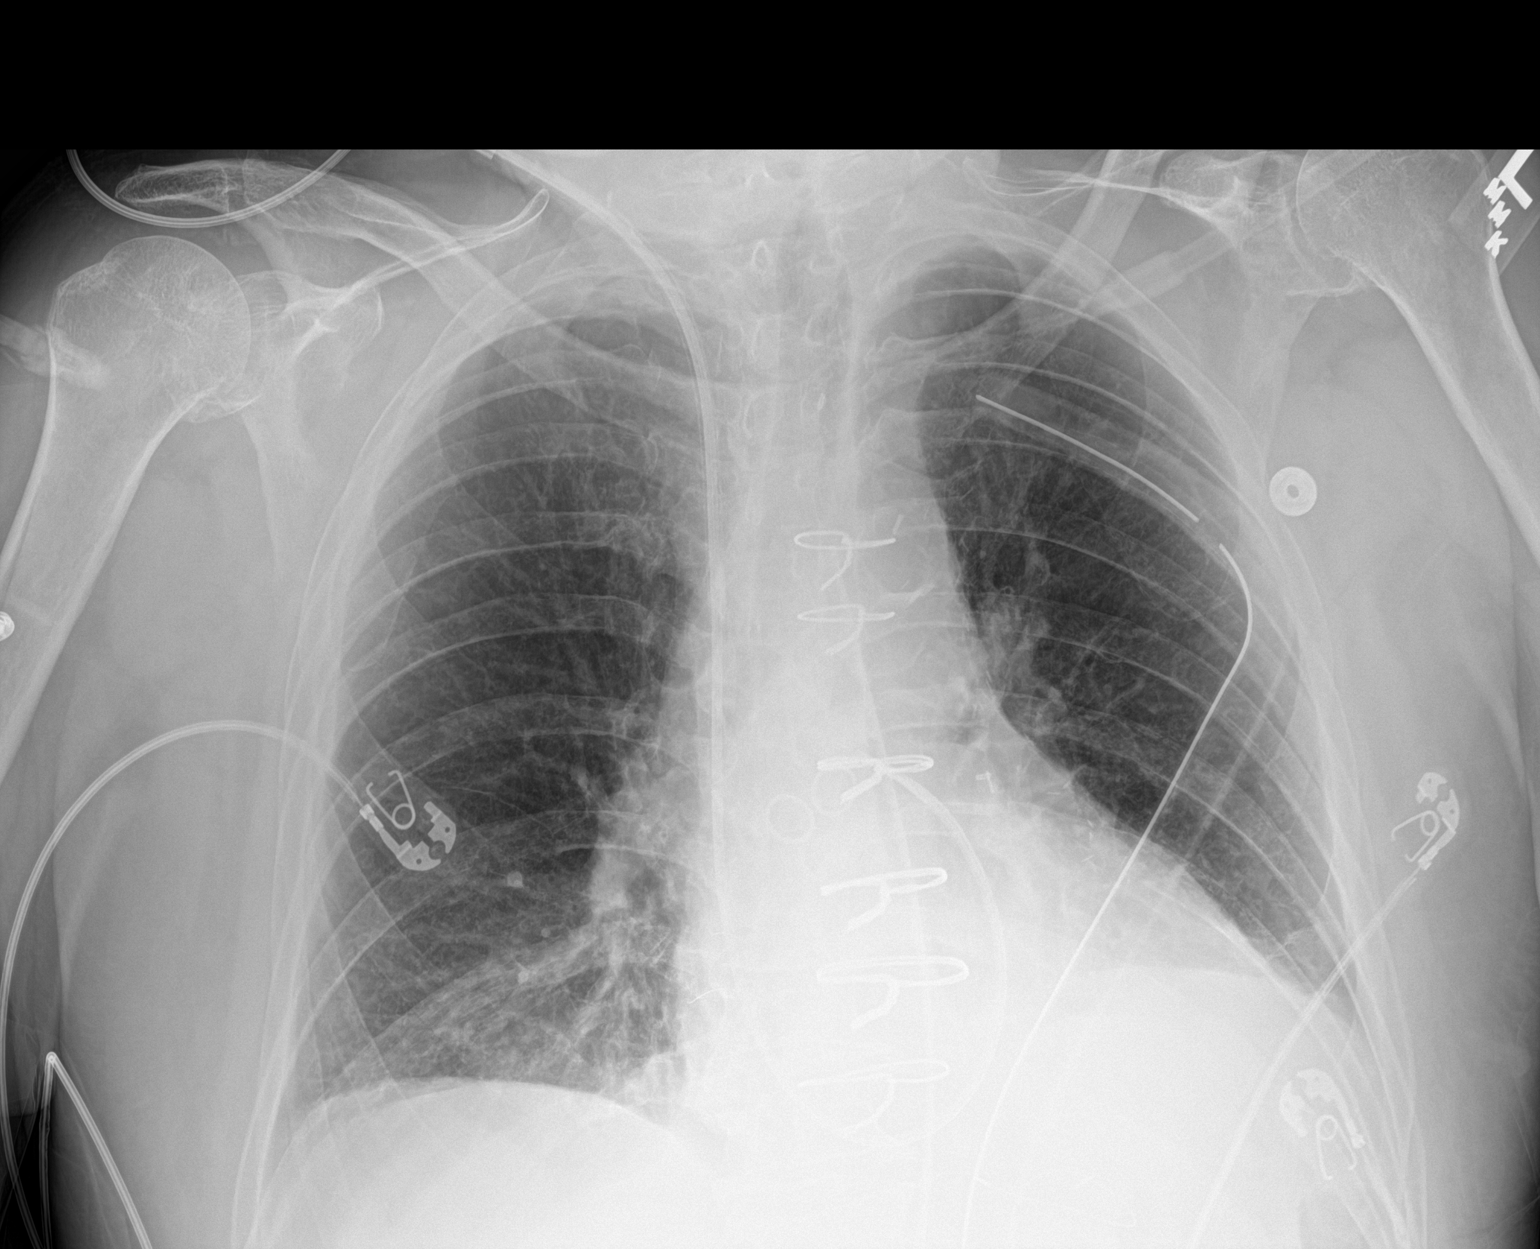

[1 of 1 positions shown; findings below may reference images not displayed]

FINDINGS: The trachea and esophagus have been extubated. The Swan-Ganz
catheter is in stable position. The heart is normal in size. The
pulmonary vascularity is normal. The left chest tube is in stable
position as is the mediastinal drain. There is no significant
pleural effusion and no pneumothorax. There may be minimal
subsegmental atelectasis in the retrocardiac region on the left. The
sternal wires are intact. The mediastinum is normal in width. There
is calcification in the wall of the aortic arch.
IMPRESSION: Minimal subsegmental atelectasis in the left lower lobe medially. No
pulmonary edema, pleural effusion, or pneumothorax. The remaining
support tubes are in reasonable position.

## 2018-05-08 IMAGING — CR DG CHEST 2V
2 series · 2 of 2 positions shown · non-contrast
Comparison: 01/27/2018 and earlier.

ADDENDUM:
Study discussed by telephone with Dr. JEAN GAEL CHOKSHI on 01/28/2018
at 3073 hours.
CLINICAL DATA: 61-year-old male postoperative day 3 status post
CABG.

EXAM:
CHEST  2 VIEW

[chest pa]
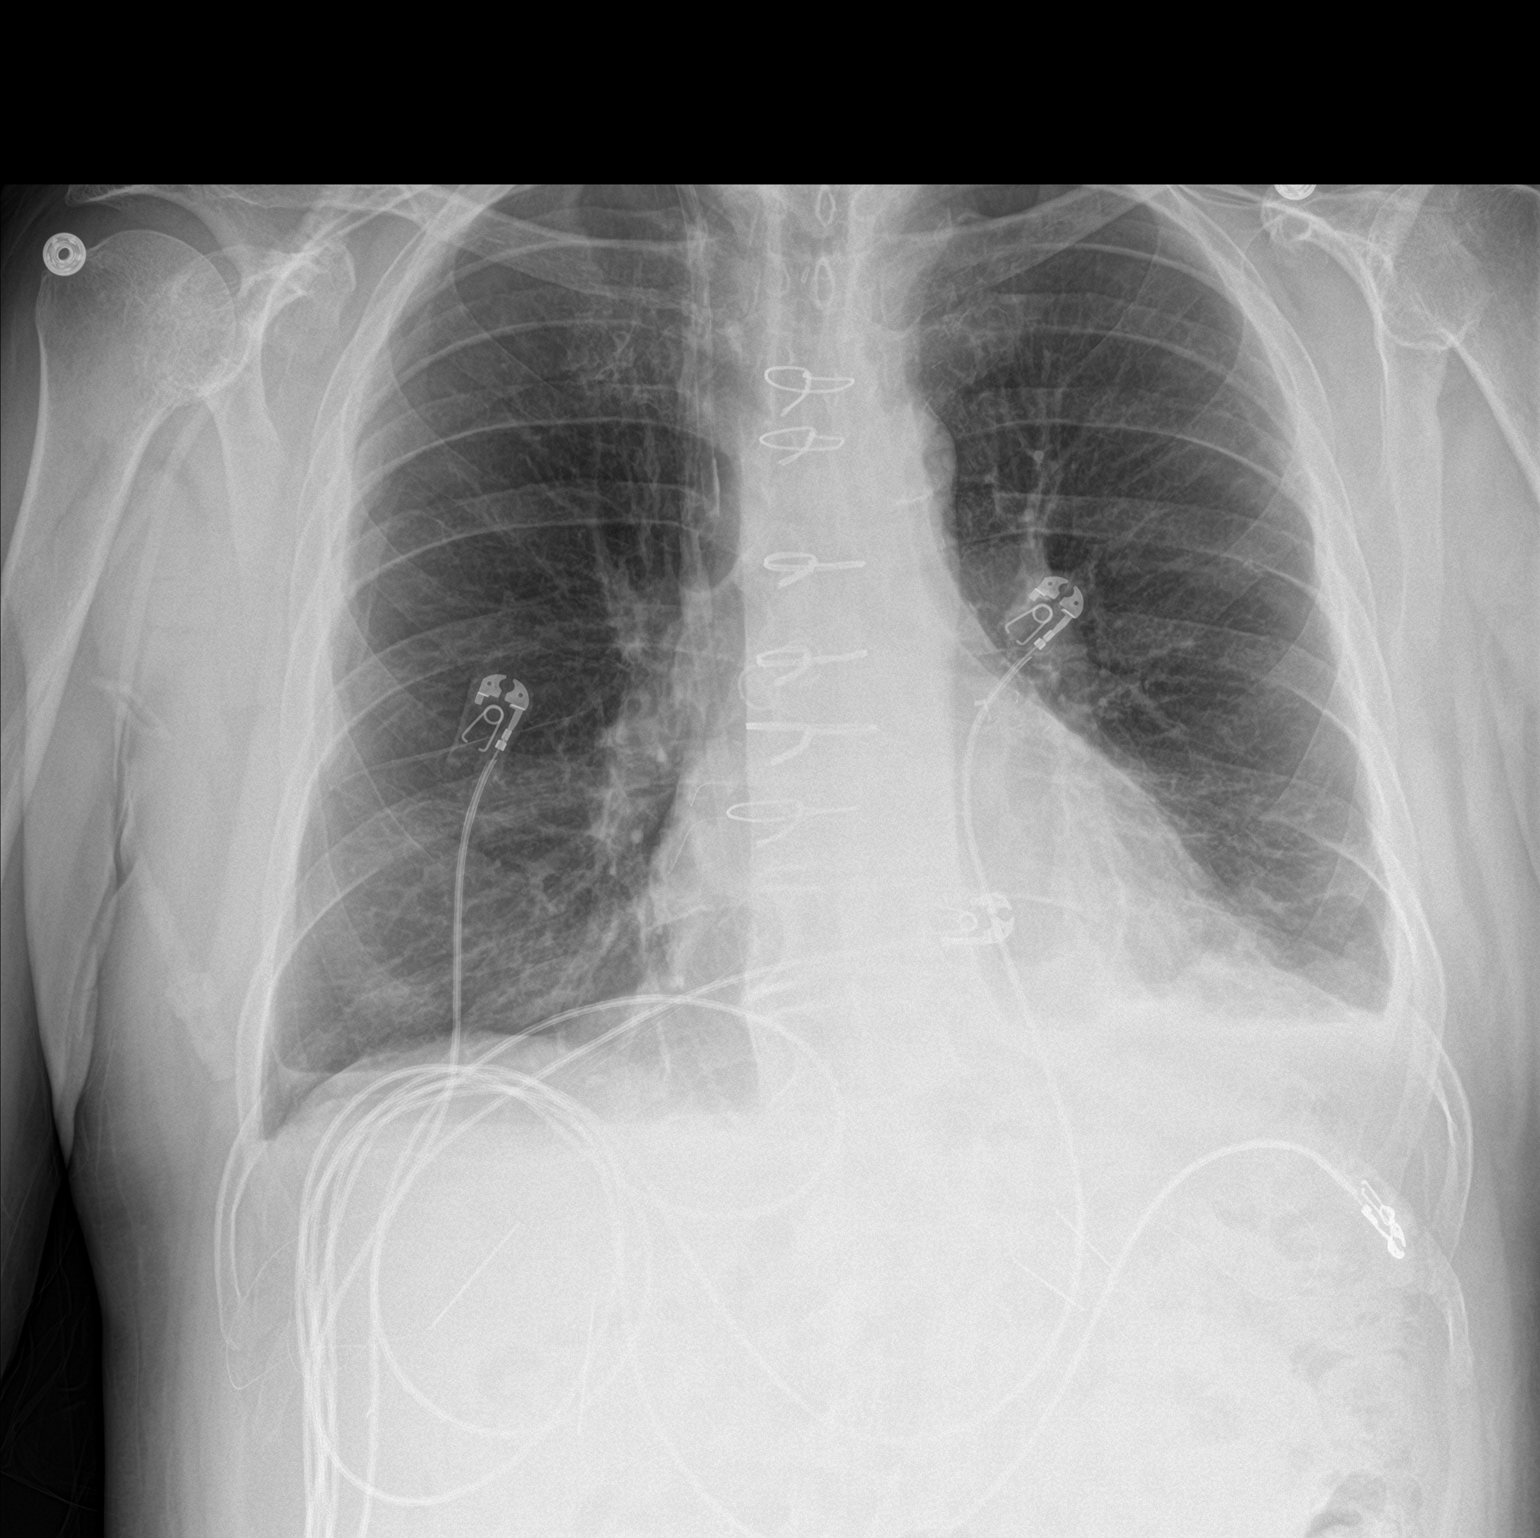

[chest lat]
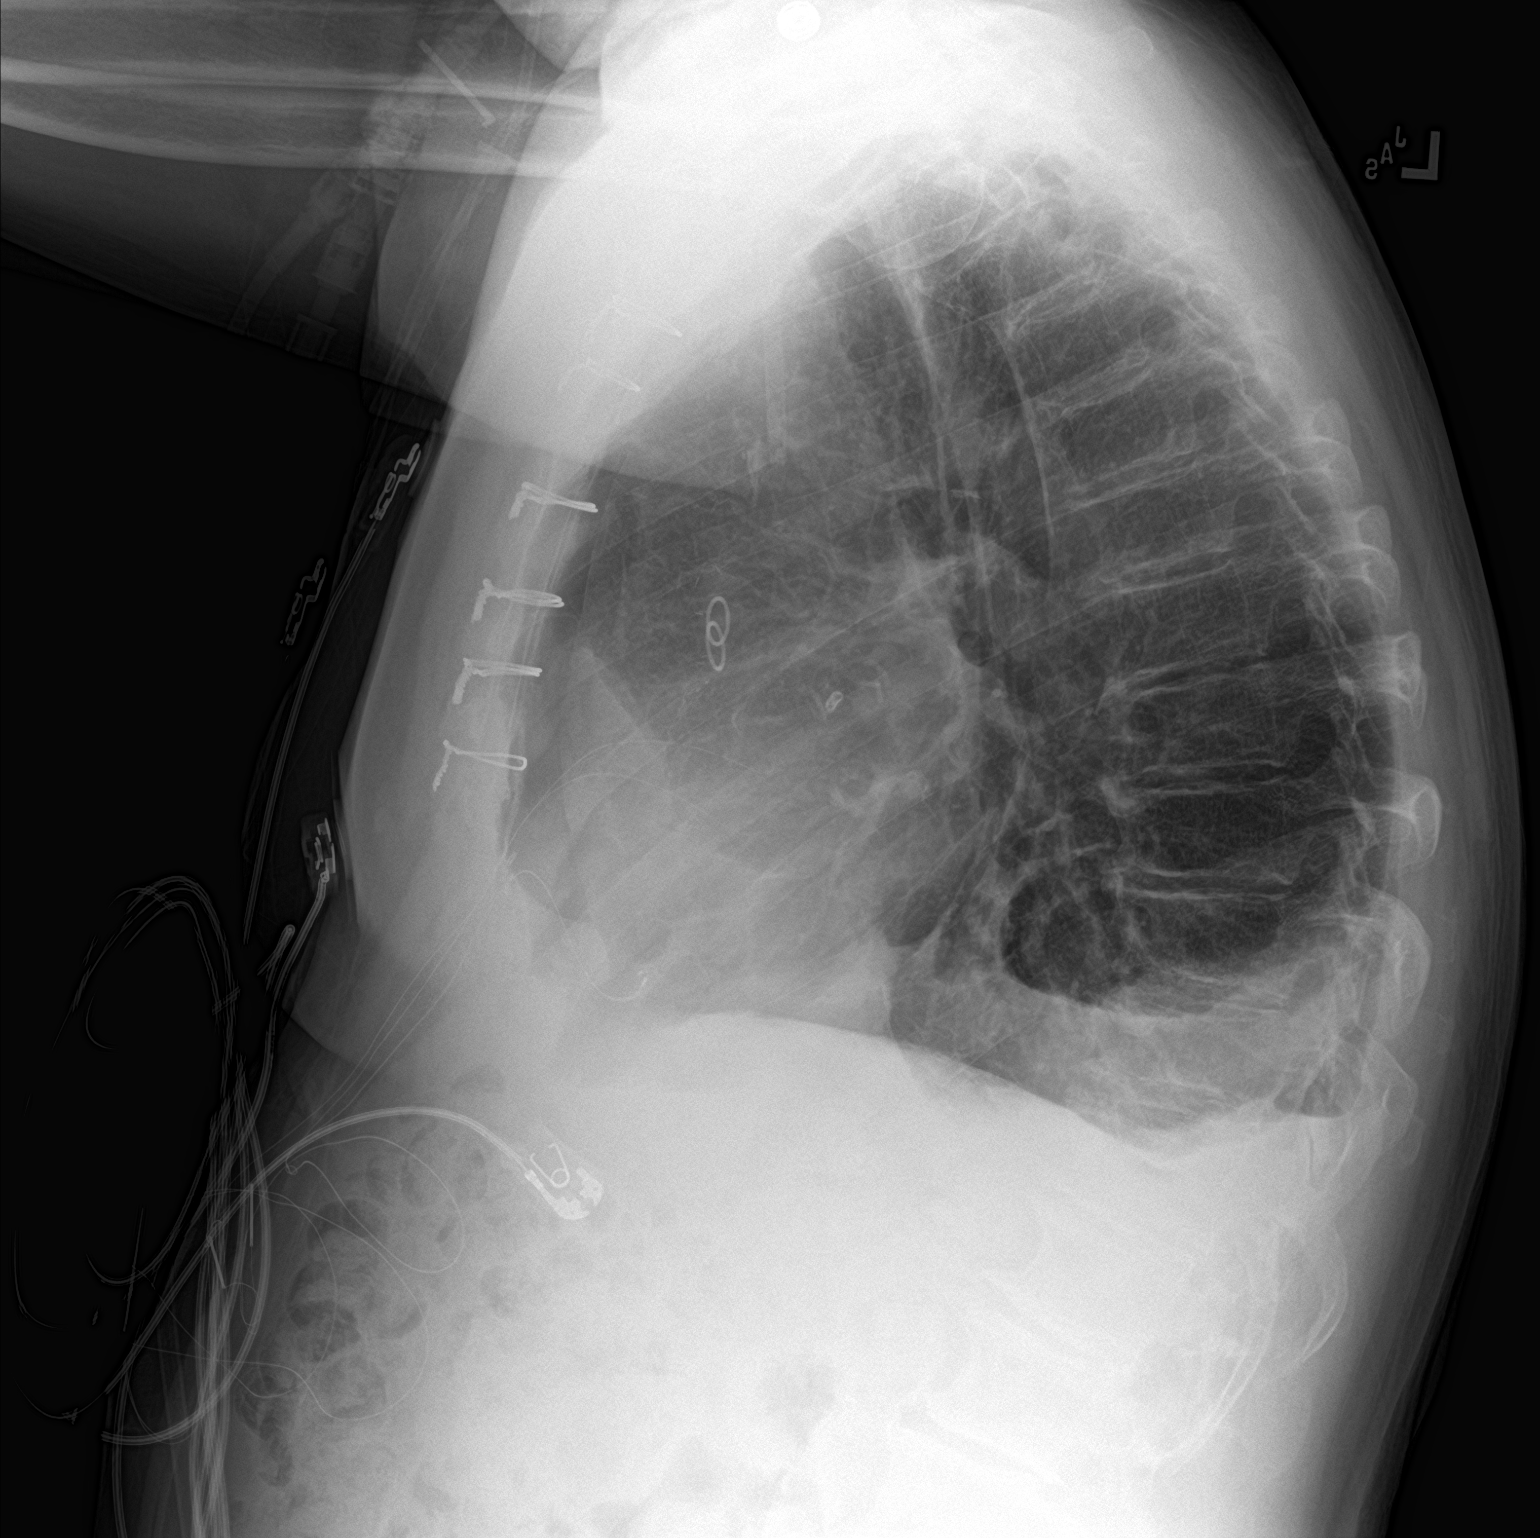

[2 of 2 positions shown; findings below may reference images not displayed]

FINDINGS: PA and lateral views. Right IJ introducer sheath and epicardial
pacer wires remain.

There is a small to moderate size right pneumothorax (see also
lateral view pleural edge anteriorly) and superimposed small volume
layering right pleural effusion.

A flat horizontal small layering pleural effusion also suggests a
small left-side pneumothorax, although no left-side pleural edge is
identified.

Mediastinal contours remain normal. Sequelae of sternotomy and CABG.
No pulmonary edema. Streaky lung base atelectasis associated with
the pleural pleural fluid. No other confluent pulmonary opacity.

Stable visualized osseous structures. Visualized tracheal air column
is within normal limits. Calcified aortic atherosclerosis. Negative
visible bowel gas pattern.
IMPRESSION: 1. Evidence of bilateral hydropneumothoraces, small on the left but
possibly a moderate volume pneumothorax on the right (see lateral
view).
2. Small volume pleural fluid bilaterally with bilateral lung base
atelectasis.

## 2018-05-09 IMAGING — CR DG CHEST 2V
2 series · 2 of 2 positions shown · non-contrast
Comparison: January 28, 2018

CLINICAL DATA: Pneumothorax

EXAM:
CHEST  2 VIEW

[chest lat]
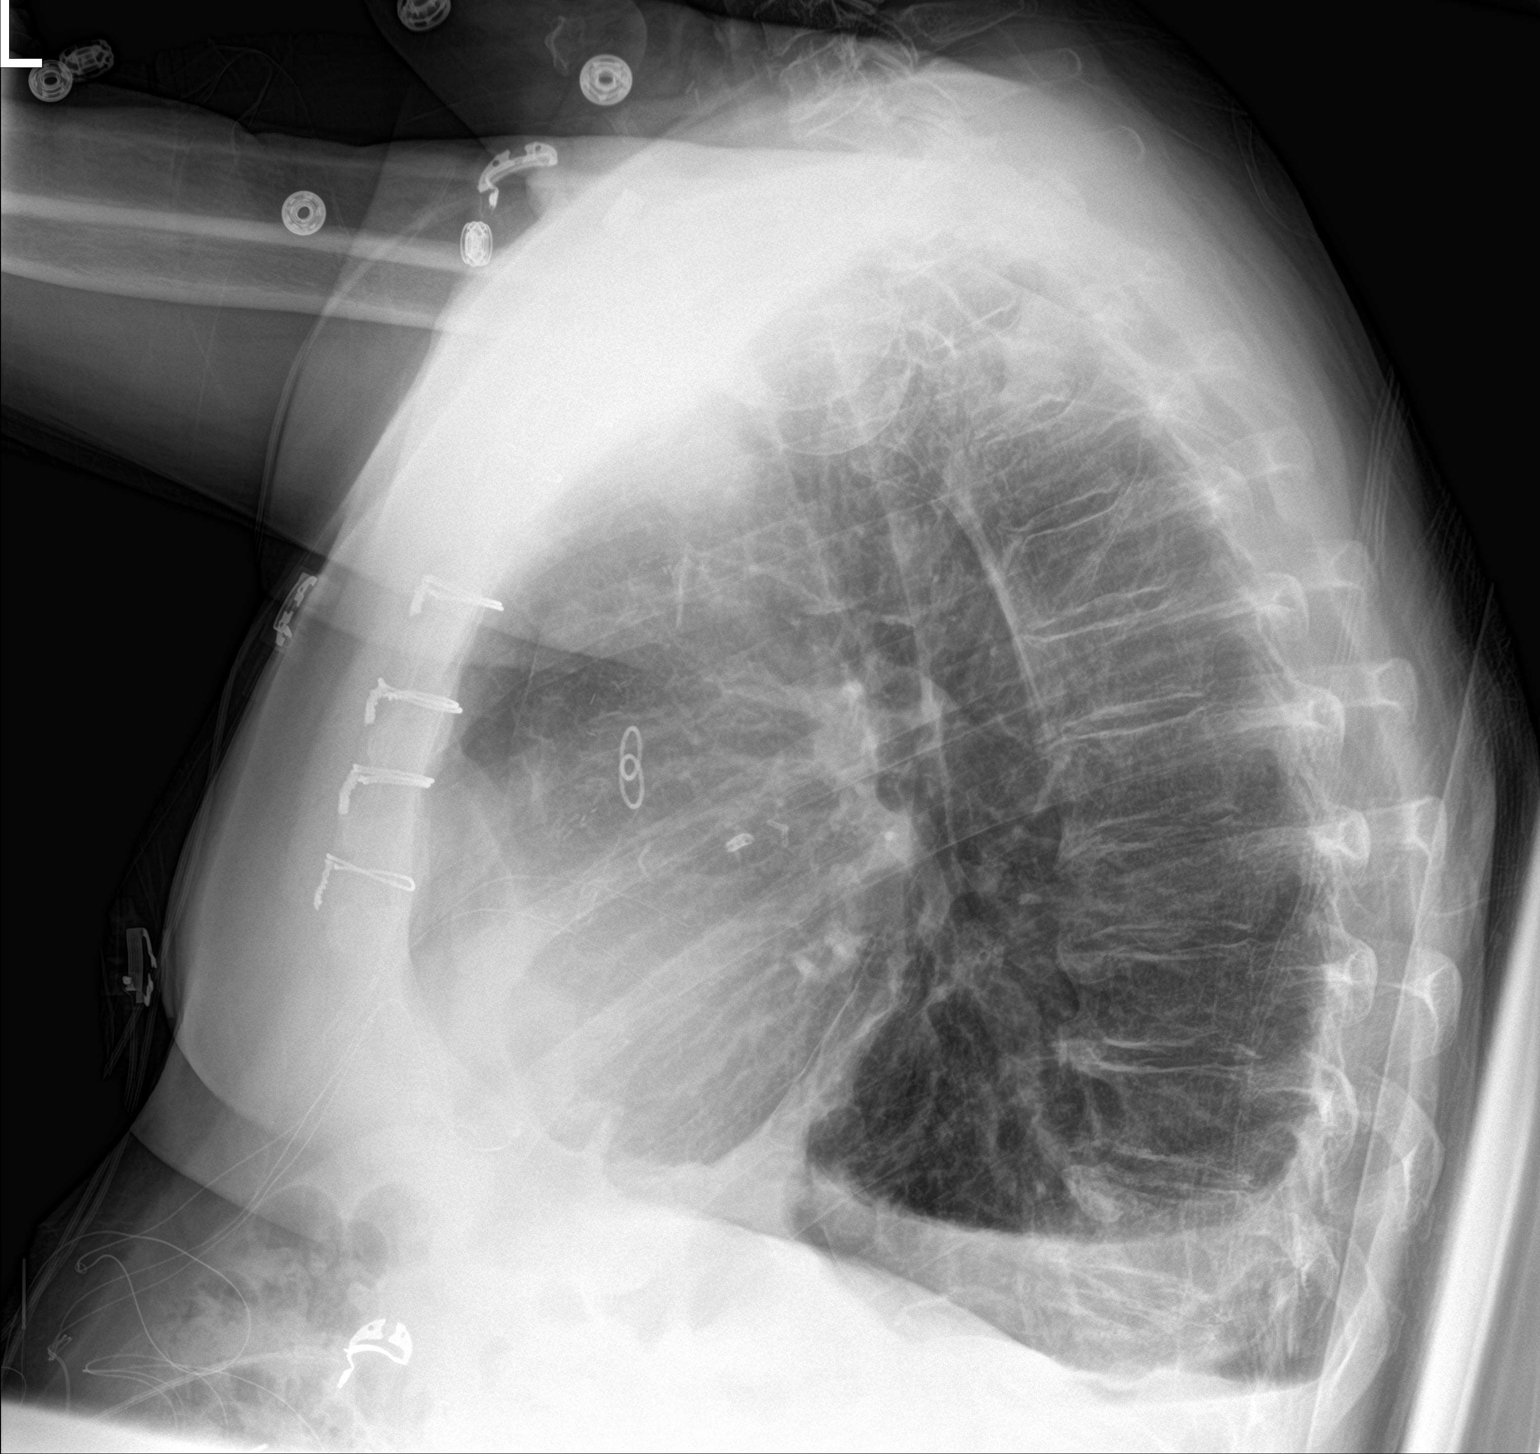

[chest ap]
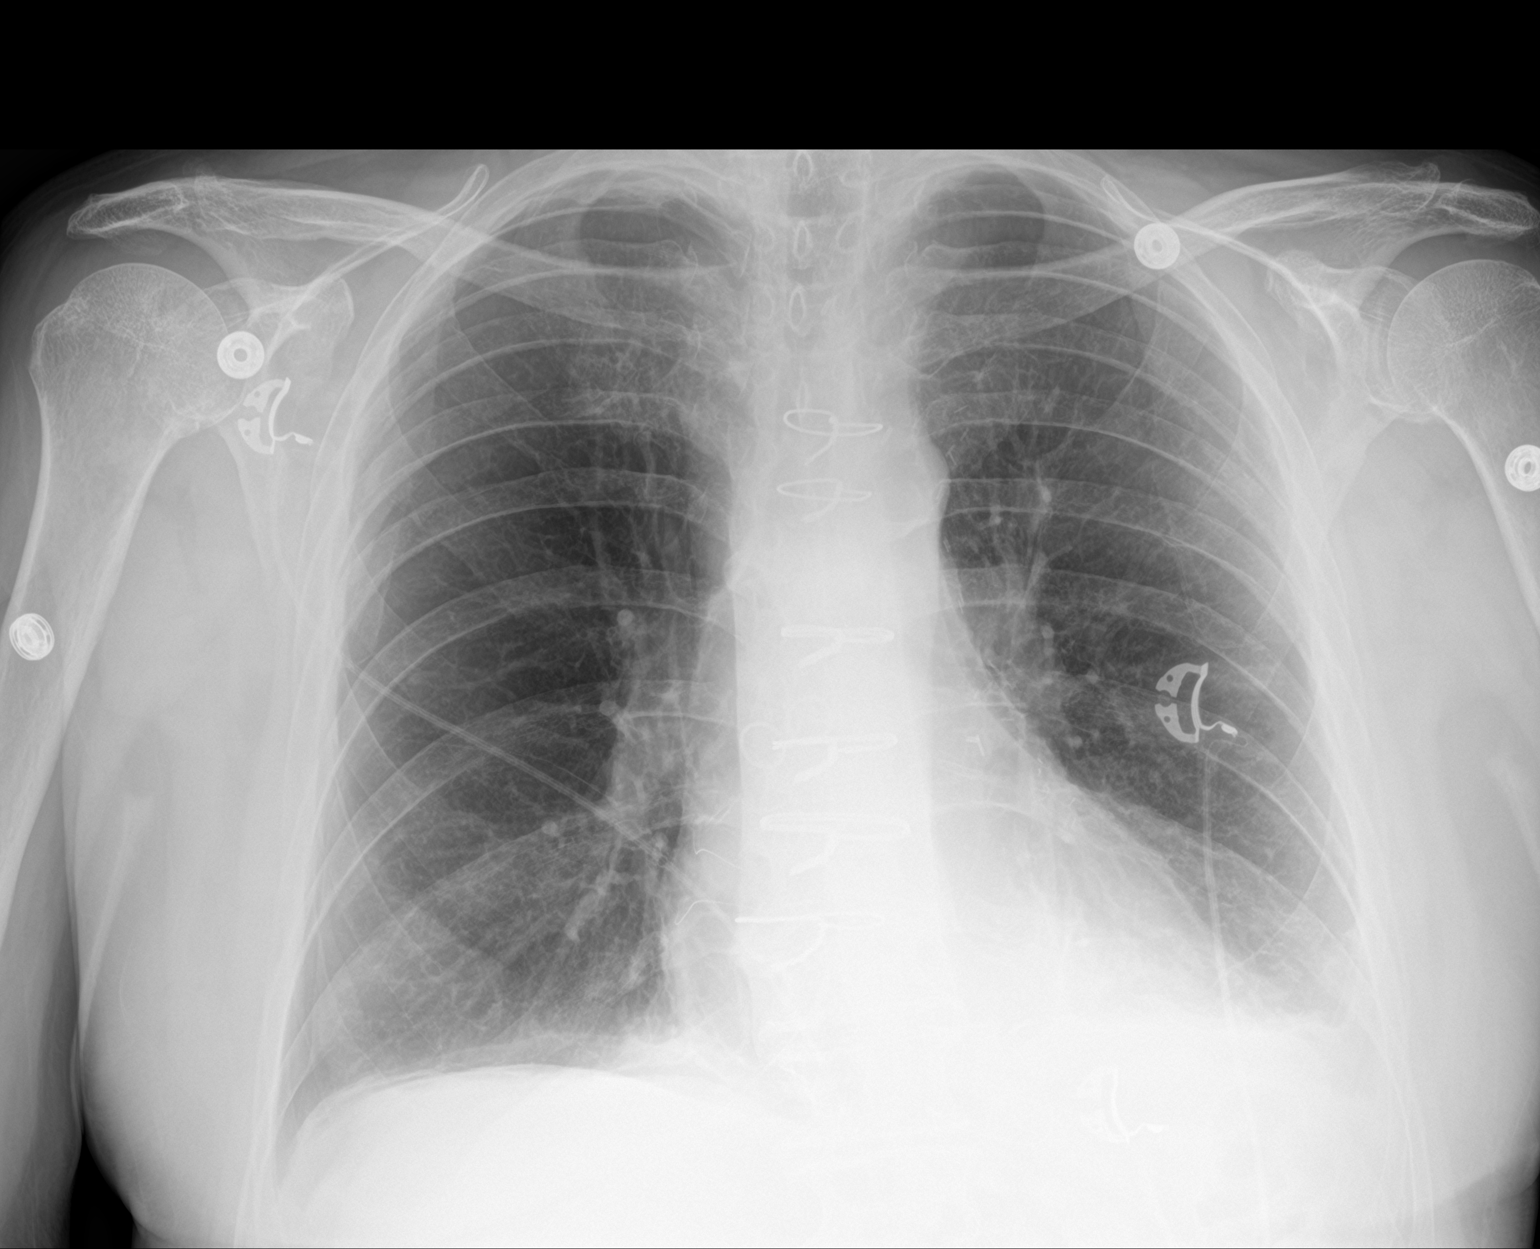

[2 of 2 positions shown; findings below may reference images not displayed]

FINDINGS: There remains a small pneumothorax on the right without tension
component. Small right effusion no longer evident. There is a small
left pleural effusion. There is bibasilar atelectasis.

Heart is upper normal in size with pulmonary vascularity within
normal limits. No adenopathy. Temporary pacemaker wires are attached
to the right heart. There is aortic atherosclerosis. Patient is
status post coronary artery bypass grafting. Central catheter no
longer present.
IMPRESSION: Persistent small right apical pneumothorax without tension
component. Bibasilar atelectasis. Small left pleural effusion. Small
right pleural effusion no longer evident. Stable cardiac silhouette.
There is aortic atherosclerosis.

Aortic Atherosclerosis ([id number]).

## 2018-06-07 ENCOUNTER — Other Ambulatory Visit: Payer: Medicaid Other

## 2018-06-09 ENCOUNTER — Other Ambulatory Visit: Payer: Self-pay | Admitting: Cardiology

## 2018-06-15 ENCOUNTER — Other Ambulatory Visit: Payer: Self-pay | Admitting: Cardiology

## 2018-06-24 ENCOUNTER — Ambulatory Visit (INDEPENDENT_AMBULATORY_CARE_PROVIDER_SITE_OTHER): Payer: Medicaid Other

## 2018-06-24 ENCOUNTER — Other Ambulatory Visit: Payer: Self-pay

## 2018-06-24 DIAGNOSIS — I5042 Chronic combined systolic (congestive) and diastolic (congestive) heart failure: Secondary | ICD-10-CM

## 2018-06-24 NOTE — Progress Notes (Signed)
Complete echocardiogram had been performed.  Jimmy Drayk Humbarger RDCS

## 2018-06-28 ENCOUNTER — Telehealth: Payer: Self-pay | Admitting: Cardiology

## 2018-06-28 NOTE — Telephone Encounter (Signed)
Left voicemail for the patient to call the office. 

## 2018-06-28 NOTE — Telephone Encounter (Signed)
Patent just had CABG and he is having some congestion and is concerned.. Please call her .

## 2018-06-28 NOTE — Telephone Encounter (Signed)
Patient states that he feels as though he is congested in his throat, does not complain of shortness of breath or weight gain. Informed patient that he would need to follow up with his PCP regarding this as it does not seem cardiac related.

## 2018-07-13 NOTE — Progress Notes (Signed)
Cardiology Office Note:    Date:  07/14/2018   ID:  Lance Ayala, DOB [birth year], MRN [id number]  PCP:  Anselmo Pickler, MD  Cardiologist:  Norman Herrlich, MD    Referring MD: Anselmo Pickler, *    ASSESSMENT:    1. Chronic combined systolic and diastolic heart failure (HCC)   2. Coronary artery disease involving native coronary artery of native heart with angina pectoris (HCC)   3. Hypertensive heart disease with heart failure (HCC)   4. Dyslipidemia   5. LBBB (left bundle branch block)    PLAN:    In order of problems listed above:  1. Worsened he has decompensated fluid overloaded short of breath resume a loop diuretic Lasix 40 mg daily 2 weeks as an outpatient [place] check renal function proBNP.  Is somewhat paradoxical as his ejection fraction has improved. 2. Stable continue medical treatment EF greater than 35 and would not advise an ICD at this time 3. Blood pressure stable heart failure decompensated restart loop diuretic continue beta-blocker vasodilator 4. Stable continue statin 5. Stable   Next appointment: 6 weeks   Medication Adjustments/Labs and Tests Ordered: Current medicines are reviewed at length with the patient today.  Concerns regarding medicines are outlined above.  No orders of the defined types were placed in this encounter.  No orders of the defined types were placed in this encounter.   Chief Complaint  Patient presents with  . Follow-up  . Congestive Heart Failure  . Coronary Artery Disease    History of Present Illness:    Lance Ayala is a 62 y.o. male with a hx of CAD bypass surgery left bundle branch block and systolic heart failure last seen 04/22/18. Compliance with diet, lifestyle and medications: Yes  Several issues in his mind the first is he is awaiting a disability determination.  Second is he has been having tinnitus somehow at the impression it was due to his low-dose aspirin and I told him I do not think it has  any bearing in this case and he will follow-up with his primary physician.  His predominant complaint is significant breathless and feels congested.  He has edema on exam his home weight is up about 10 pounds he needs to renew a loop diuretic.  Follow-up labs in 2 weeks.  No angina orthopnea palpitations syncope or TIA Past Medical History:  Diagnosis Date  . CAD (coronary artery disease)    a. remote PCI. b. CABG x 4 01/2018.  . Carotid artery disease (HCC)    a. s/p R CEA 12/2017 - followed by vascular.  . Cholelithiasis   . Chronic combined systolic and diastolic CHF (congestive heart failure) (HCC)    a. LVEF 35-40% in 10/2017.  Marland Kitchen COPD (chronic obstructive pulmonary disease) (HCC) 10/21/2017  . Dyslipidemia   . Hypertension 10/21/2017  . LBBB (left bundle branch block)   . Lung nodule    a. lung nodules/mediastinal/axillary lymphadenopathy by CT 10/2017.  Marland Kitchen Myocardial infarction (HCC) 10/21/2017  . TIA (transient ischemic attack)    " Mini stroke in 2000"  . Wears dentures     Past Surgical History:  Procedure Laterality Date  . CORONARY ANGIOPLASTY WITH STENT PLACEMENT     2 stents  . CORONARY ARTERY BYPASS GRAFT N/A 01/25/2018   Procedure: CORONARY ARTERY BYPASS GRAFTING (CABG), ON PUMP, TIMES FOUR, USING LEFT INTERNAL MAMMARY ARTERY AND ENDOSCOPICALLY HARVESTED RIGHT GREATER SAPHENOUS VEIN;  Surgeon: Kerin Perna, MD;  Location: [facility] OR;  Service: Open Heart Surgery;  Laterality: N/A;  . ENDARTERECTOMY Right 12/28/2017   Procedure: RIGHT CAROTID ENDARTERECTOMY;  Surgeon: [name], MD;  Location: [facility] OR;  Service: Vascular;  Laterality: Right;  . LEFT HEART CATH AND CORONARY ANGIOGRAPHY N/A 10/29/2017   Procedure: LEFT HEART CATH AND CORONARY ANGIOGRAPHY;  Surgeon: Tonny Bollman, MD;  Location[facility] INVASIVE CV LAB;  Service: Cardiovascular;  Laterality: N/A;  . MULTIPLE TOOTH EXTRACTIONS    . PATCH ANGIOPLASTY Right 12/28/2017   Procedure: WITH  HEMASHIELD  0.3 X 3 IN  USE  AS PATCH ANGIOPLASTY;  Surgeon: [name], MD;  Location: [facility] OR;  Service: Vascular;  Laterality: Right;  . TEE WITHOUT CARDIOVERSION N/A 01/25/2018   Procedure: TRANSESOPHAGEAL ECHOCARDIOGRAM (TEE);  Surgeon: Donata Clay, Theron Arista, MD;  Location: [facility] OR;  Service: Open Heart Surgery;  Laterality: N/A;    Current Medications: Current Meds  Medication Sig  . aspirin EC 325 MG EC tablet Take 1 tablet (325 mg total) by mouth daily.  Marland Kitchen atorvastatin (LIPITOR) 80 MG tablet TAKE ONE TABLET BY MOUTH EVERY EVENING  . carvedilol (COREG) 12.5 MG tablet Take 0.5 tablets (6.25 mg total) by mouth 2 (two) times daily with a meal.  . lisinopril (PRINIVIL,ZESTRIL) 20 MG tablet Take 0.5 tablets (10 mg total) by mouth at bedtime.  Marland Kitchen spironolactone (ALDACTONE) 25 MG tablet Take 0.5 tablets (12.5 mg total) by mouth daily.     Allergies:   Patient has no known allergies.   Social History   Socioeconomic History  . Marital status: Single    Spouse name: Not on file  . Number of children: Not on file  . Years of education: Not on file  . Highest education level: Not on file  Occupational History  . Not on file  Social Needs  . Financial resource strain: Not on file  . Food insecurity:    Worry: Not on file    Inability: Not on file  . Transportation needs:    Medical: Not on file    Non-medical: Not on file  Tobacco Use  . Smoking status: Former Smoker    Packs/day: 1.00    Years: 20.00    Pack years: 20.00    Last attempt to quit: 12/01/1998    Years since quitting: 19.6  . Smokeless tobacco: Never Used  Substance and Sexual Activity  . Alcohol use: No    Frequency: Never  . Drug use: No  . Sexual activity: Not on file  Lifestyle  . Physical activity:    Days per week: Not on file    Minutes per session: Not on file  . Stress: Not on file  Relationships  . Social connections:    Talks on phone: Not on file    Gets together: Not on file    Attends religious service: Not on file    Active  member of club or organization: Not on file    Attends meetings of clubs or organizations: Not on file    Relationship status: Not on file  Other Topics Concern  . Not on file  Social History Narrative  . Not on file     Family History: The patient's family history includes Heart attack in his father; Hypertension in his mother; Lung disease in his brother; Stroke in his mother. ROS:   Please see the history of present illness.    All other systems reviewed and are negative.  EKGs/Labs/Other Studies Reviewed:    The following studies  were reviewed today:   Echo 06/24/18 EF 35-40% and mild MR Recent Labs: 11/13/2017: NT-Pro BNP 614 01/26/2018: Magnesium 2.3 02/16/2018: ALT 18; BUN 29; Creatinine, Ser 1.17; Hemoglobin 11.6; Platelets 417; Potassium 4.7; Sodium 139  Recent Lipid Panel    Component Value Date/Time   CHOL 124 02/16/2018 1133   TRIG 136 02/16/2018 1133   HDL 39 (L) 02/16/2018 1133   CHOLHDL 3.2 02/16/2018 1133   LDLCALC 58 02/16/2018 1133    Physical Exam:    VS:  BP (!) 150/80   Pulse 86   Ht 5\' 6"  (1.676 m)   Wt 166 lb 12.8 oz (75.7 kg)   SpO2 97%   BMI 26.92 kg/m     Wt Readings from Last 3 Encounters:  07/14/18 166 lb 12.8 oz (75.7 kg)  04/22/18 157 lb 1.9 oz (71.3 kg)  03/23/18 156 lb (70.8 kg)     GEN:  Well nourished, well developed in no acute distress HEENT: Normal NECK: No JVD; No carotid bruits LYMPHATICS: No lymphadenopathy CARDIAC: S3 is newRRR, no murmurs, rubs, gallops RESPIRATORY:  Clear to auscultation without rales, wheezing or rhonchi  ABDOMEN: Soft, non-tender, non-distended MUSCULOSKELETAL:  2-3+ to the knee bilateraledema; No deformity  SKIN: Warm and dry NEUROLOGIC:  Alert and oriented x 3 PSYCHIATRIC:  Normal affect    Signed, Norman Herrlich, MD worsened 07/14/2018 2:15 PM    [facility]

## 2018-07-14 ENCOUNTER — Encounter: Payer: Self-pay | Admitting: Cardiology

## 2018-07-14 ENCOUNTER — Ambulatory Visit (INDEPENDENT_AMBULATORY_CARE_PROVIDER_SITE_OTHER): Payer: Medicaid Other | Admitting: Cardiology

## 2018-07-14 VITALS — BP 150/80 | HR 86 | Ht 66.0 in | Wt 166.8 lb

## 2018-07-14 DIAGNOSIS — I25119 Atherosclerotic heart disease of native coronary artery with unspecified angina pectoris: Secondary | ICD-10-CM | POA: Diagnosis not present

## 2018-07-14 DIAGNOSIS — E785 Hyperlipidemia, unspecified: Secondary | ICD-10-CM | POA: Diagnosis not present

## 2018-07-14 DIAGNOSIS — I5042 Chronic combined systolic (congestive) and diastolic (congestive) heart failure: Secondary | ICD-10-CM

## 2018-07-14 DIAGNOSIS — I110 Hypertensive heart disease with heart failure: Secondary | ICD-10-CM

## 2018-07-14 DIAGNOSIS — I447 Left bundle-branch block, unspecified: Secondary | ICD-10-CM

## 2018-07-14 MED ORDER — ASPIRIN EC 81 MG PO TBEC: 81.0000 mg | DELAYED_RELEASE_TABLET | Freq: Every day | ORAL | 3 refills | Status: AC

## 2018-07-14 MED ORDER — FUROSEMIDE 40 MG PO TABS: 40.0000 mg | ORAL_TABLET | Freq: Every day | ORAL | 3 refills | Status: DC

## 2018-07-14 NOTE — Patient Instructions (Addendum)
Medication Instructions:  Your physician has recommended you make the following change in your medication:   DECREASE aspirin 81 mg daily  START furosemide (lasix) 40 mg daily   Labwork: Your physician recommends that you return for lab work in 2 weeks: BMP, ProBNP. You can go to the [place] office to have these labs drawn, no need to fast before. Our office is open Monday through Friday 8-5.   Testing/Procedures: None  Follow-Up: Your physician recommends that you schedule a follow-up appointment in: 6 weeks.    If you need a refill on your cardiac medications before your next appointment, please call your pharmacy.   Thank you for choosing [facility]! Robyne Peers, RN [phone]      Heart Failure  Weigh yourself every morning when you first wake up and record on a calender or note pad, bring this to your office visits. Using a pill tender can help with taking your medications consistently.  Limit your fluid intake to 2 liters daily  Limit your sodium intake to less than 2-3 grams daily. Ask if you need dietary teaching.  If you gain more than 3 pounds (from your dry weight ), double your dose of diuretic for the day.  If you gain more than 5 pounds (from your dry weight), double your dose of lasix and call your heart failure doctor.  Please do not smoke tobacco since it is very bad for your heart.  Please do not drink alcohol since it can worsen your heart failure.Also avoid OTC nonsteroidal drugs, such as advil, aleve and motrin.  Try to exercise for at least 30 minutes every day because this will help your heart be more efficient. You may be eligible for supervised cardiac rehab, ask your physician.     Furosemide tablets What is this medicine? FUROSEMIDE (fyoor [place] se mide) is a diuretic. It helps you make more urine and to lose salt and excess water from your body. This medicine is used to treat high blood pressure, and edema or swelling from  heart, kidney, or liver disease. This medicine may be used for other purposes; ask your health care provider or pharmacist if you have questions. COMMON BRAND NAME(S): Active-Medicated Specimen Kit, Delone, Diuscreen, Lasix, RX Specimen Collection Kit, Specimen Collection Kit, URINX Medicated Specimen Collection What should I tell my health care provider before I take this medicine? They need to know if you have any of these conditions: -abnormal blood electrolytes -diarrhea or vomiting -gout -heart disease -kidney disease, small amounts of urine, or difficulty passing urine -liver disease -thyroid disease -an unusual or allergic reaction to furosemide, sulfa drugs, other medicines, foods, dyes, or preservatives -pregnant or trying to get pregnant -breast-feeding How should I use this medicine? Take this medicine by mouth with a glass of water. Follow the directions on the prescription label. You may take this medicine with or without food. If it upsets your stomach, take it with food or milk. Do not take your medicine more often than directed. Remember that you will need to pass more urine after taking this medicine. Do not take your medicine at a time of day that will cause you problems. Do not take at bedtime. Talk to your pediatrician regarding the use of this medicine in children. While this drug may be prescribed for selected conditions, precautions do apply. Overdosage: If you think you have taken too much of this medicine contact a poison control center or emergency room at once. NOTE: This medicine is only for  you. Do not share this medicine with others. What if I miss a dose? If you miss a dose, take it as soon as you can. If it is almost time for your next dose, take only that dose. Do not take double or extra doses. What may interact with this medicine? -aspirin and aspirin-like medicines -certain antibiotics -chloral  hydrate -cisplatin -cyclosporine -digoxin -diuretics -laxatives -lithium -medicines for blood pressure -medicines that relax muscles for surgery -methotrexate -NSAIDs, medicines for pain and inflammation like ibuprofen, naproxen, or indomethacin -phenytoin -steroid medicines like prednisone or cortisone -sucralfate -thyroid hormones This list may not describe all possible interactions. Give your health care provider a list of all the medicines, herbs, non-prescription drugs, or dietary supplements you use. Also tell them if you smoke, drink alcohol, or use illegal drugs. Some items may interact with your medicine. What should I watch for while using this medicine? Visit your doctor or health care professional for regular checks on your progress. Check your blood pressure regularly. Ask your doctor or health care professional what your blood pressure should be, and when you should contact him or her. If you are a diabetic, check your blood sugar as directed. You may need to be on a special diet while taking this medicine. Check with your doctor. Also, ask how many glasses of fluid you need to drink a day. You must not get dehydrated. You may get drowsy or dizzy. Do not drive, use machinery, or do anything that needs mental alertness until you know how this drug affects you. Do not stand or sit up quickly, especially if you are an older patient. This reduces the risk of dizzy or fainting spells. Alcohol can make you more drowsy and dizzy. Avoid alcoholic drinks. This medicine can make you more sensitive to the sun. Keep out of the sun. If you cannot avoid being in the sun, wear protective clothing and use sunscreen. Do not use sun lamps or tanning beds/booths. What side effects may I notice from receiving this medicine? Side effects that you should report to your doctor or health care professional as soon as possible: -blood in urine or stools -dry mouth -fever or chills -hearing loss or  ringing in the ears -irregular heartbeat -muscle pain or weakness, cramps -skin rash -stomach upset, pain, or nausea -tingling or numbness in the hands or feet -unusually weak or tired -vomiting or diarrhea -yellowing of the eyes or skin Side effects that usually do not require medical attention (report to your doctor or health care professional if they continue or are bothersome): -headache -loss of appetite -unusual bleeding or bruising This list may not describe all possible side effects. Call your doctor for medical advice about side effects. You may report side effects to FDA at 1-800-FDA-1088. Where should I keep my medicine? Keep out of the reach of children. Store at room temperature between 15 and 30 degrees C (59 and 86 degrees F). Protect from light. Throw away any unused medicine after the expiration date. NOTE: This sheet is a summary. It may not cover all possible information. If you have questions about this medicine, talk to your doctor, pharmacist, or health care provider.  2018 Elsevier/Gold Standard (2015-02-07 13:49:50)   Natriuretic Peptides Test Why am I having this test? These are tests that help to diagnose the presence of heart failure and determine how severe it is. They may be done if you are being treated for heart failure. They may be done if you have symptoms of heart  failure including:  Shortness of breath.  Fatigue.  There are three main natriuretic peptides (NPs):  ANP. This is produced by the part of your heart that receives blood from the body (atrium).  BNP. This is produced by your heart's main pumping chamber (left ventricle).  CNP. This is produced by the cells that line your blood vessels (endothelial cells).  In particular, BNP levels may help your health care provider:  To diagnose heart abnormalities and heart failure.  To monitor disease progression and treatment.  What kind of sample is taken? A blood sample is required for  this test. It is usually collected by inserting a needle into a vein. How do I prepare for this test? There is no preparation required for this test. What are the reference values? Reference values are considered healthy values established after testing a large group of healthy people. Reference values may vary among different people, labs, and hospitals. It is your responsibility to obtain your test results. Ask the lab or department performing the test when and how you will get your results. Reference values are as follows:  ANP: 22-77 pg/mL or 22-77 ng/L (SI units).  BNP: Less than 100 pg/mL or less than 100 ng/L (SI units).  CNP: These values are yet to be determined.  What do the results mean? Results that are higher than the reference values may indicate:  Heart failure.  Heart attack.  High blood pressure (hypertension).  Heart transplant rejection.  Talk with your health care provider to discuss your results, treatment options, and if necessary, the need for more tests. Talk with your health care provider if you have any questions about your results. Talk with your health care provider to discuss your results, treatment options, and if necessary, the need for more tests. Talk with your health care provider if you have any questions about your results. This information is not intended to replace advice given to you by your health care provider. Make sure you discuss any questions you have with your health care provider. Document Released: 12/20/2004 Document Revised: 07/23/2016 Document Reviewed: 05/08/2014 Elsevier Interactive Patient Education  2018 [employer].

## 2018-07-15 ENCOUNTER — Telehealth: Payer: Self-pay | Admitting: *Deleted

## 2018-07-15 NOTE — Telephone Encounter (Signed)
Patient informed that Dr. Dulce Sellar does not wish to fill out disability form at this time. Patient verbalized understanding. No further questions.

## 2018-07-20 ENCOUNTER — Ambulatory Visit: Payer: Medicaid Other | Admitting: Vascular Surgery

## 2018-07-20 ENCOUNTER — Encounter (HOSPITAL_COMMUNITY): Payer: Self-pay

## 2018-07-22 ENCOUNTER — Other Ambulatory Visit: Payer: Self-pay | Admitting: Cardiology

## 2018-07-22 DIAGNOSIS — Z736 Limitation of activities due to disability: Secondary | ICD-10-CM

## 2018-08-03 ENCOUNTER — Other Ambulatory Visit: Payer: Self-pay | Admitting: *Deleted

## 2018-08-03 DIAGNOSIS — I110 Hypertensive heart disease with heart failure: Secondary | ICD-10-CM

## 2018-08-03 DIAGNOSIS — I5042 Chronic combined systolic (congestive) and diastolic (congestive) heart failure: Secondary | ICD-10-CM

## 2018-08-03 LAB — BASIC METABOLIC PANEL
BUN/Creatinine Ratio: 17 (ref 10–24)
BUN: 19 mg/dL (ref 8–27)
CO2: 27 mmol/L (ref 20–29)
CREATININE: 1.11 mg/dL (ref 0.76–1.27)
Calcium: 9.5 mg/dL (ref 8.6–10.2)
Chloride: 100 mmol/L (ref 96–106)
GFR, EST AFRICAN AMERICAN: 82 mL/min/{1.73_m2} (ref >59)
GFR, EST NON AFRICAN AMERICAN: 71 mL/min/{1.73_m2} (ref >59)
Glucose: 105 mg/dL — ABNORMAL HIGH (ref 65–99)
Potassium: 4.5 mmol/L (ref 3.5–5.2)
Sodium: 138 mmol/L (ref 134–144)

## 2018-08-03 LAB — PRO B NATRIURETIC PEPTIDE: NT-Pro BNP: 1028 pg/mL — ABNORMAL HIGH (ref 0–210)

## 2018-08-25 ENCOUNTER — Ambulatory Visit (INDEPENDENT_AMBULATORY_CARE_PROVIDER_SITE_OTHER): Payer: Medicaid Other | Admitting: Cardiology

## 2018-08-25 ENCOUNTER — Encounter: Payer: Self-pay | Admitting: Cardiology

## 2018-08-25 VITALS — BP 132/88 | HR 99 | Ht 66.0 in | Wt 163.6 lb

## 2018-08-25 DIAGNOSIS — I5042 Chronic combined systolic (congestive) and diastolic (congestive) heart failure: Secondary | ICD-10-CM

## 2018-08-25 DIAGNOSIS — E785 Hyperlipidemia, unspecified: Secondary | ICD-10-CM

## 2018-08-25 DIAGNOSIS — I25119 Atherosclerotic heart disease of native coronary artery with unspecified angina pectoris: Secondary | ICD-10-CM

## 2018-08-25 DIAGNOSIS — I110 Hypertensive heart disease with heart failure: Secondary | ICD-10-CM

## 2018-08-25 MED ORDER — CEPHALEXIN 500 MG PO CAPS: 500.0000 mg | ORAL_CAPSULE | Freq: Four times a day (QID) | ORAL | 0 refills | Status: DC

## 2018-08-25 NOTE — Patient Instructions (Signed)
Medication Instructions:  Your physician has recommended you make the following change in your medication:  START cephalexin (keflex) 500 mg: Take 1 tablet four times daily   Labwork: None  Testing/Procedures: None  Follow-Up: Your physician wants you to follow-up in: 3 months. You will receive a reminder letter in the mail two months in advance. If you don't receive a letter, please call our office to schedule the follow-up appointment.   If you need a refill on your cardiac medications before your next appointment, please call your pharmacy.   Thank you for choosing [facility]! Mady Gemma, RN [phone]   Cephalexin tablets or capsules What is this medicine? CEPHALEXIN (sef a LEX in) is a cephalosporin antibiotic. It is used to treat certain kinds of bacterial infections It will not work for colds, flu, or other viral infections. This medicine may be used for other purposes; ask your health care provider or pharmacist if you have questions. COMMON BRAND NAME(S): Biocef, Daxbia, Keflex, Keftab What should I tell my health care provider before I take this medicine? They need to know if you have any of these conditions: -kidney disease -stomach or intestine problems, especially colitis -an unusual or allergic reaction to cephalexin, other cephalosporins, penicillins, other antibiotics, medicines, foods, dyes or preservatives -pregnant or trying to get pregnant -breast-feeding How should I use this medicine? Take this medicine by mouth with a full glass of water. Follow the directions on the prescription label. This medicine can be taken with or without food. Take your medicine at regular intervals. Do not take your medicine more often than directed. Take all of your medicine as directed even if you think you are better. Do not skip doses or stop your medicine early. Talk to your pediatrician regarding the use of this medicine in children. While this drug may be  prescribed for selected conditions, precautions do apply. Overdosage: If you think you have taken too much of this medicine contact a poison control center or emergency room at once. NOTE: This medicine is only for you. Do not share this medicine with others. What if I miss a dose? If you miss a dose, take it as soon as you can. If it is almost time for your next dose, take only that dose. Do not take double or extra doses. There should be at least 4 to 6 hours between doses. What may interact with this medicine? -probenecid -some other antibiotics This list may not describe all possible interactions. Give your health care provider a list of all the medicines, herbs, non-prescription drugs, or dietary supplements you use. Also tell them if you smoke, drink alcohol, or use illegal drugs. Some items may interact with your medicine. What should I watch for while using this medicine? Tell your doctor or health care professional if your symptoms do not begin to improve in a few days. Do not treat diarrhea with over the counter products. Contact your doctor if you have diarrhea that lasts more than 2 days or if it is severe and watery. If you have diabetes, you may get a false-positive result for sugar in your urine. Check with your doctor or health care professional. What side effects may I notice from receiving this medicine? Side effects that you should report to your doctor or health care professional as soon as possible: -allergic reactions like skin rash, itching or hives, swelling of the face, lips, or tongue -breathing problems -pain or trouble passing urine -redness, blistering, peeling or loosening of the skin,  including inside the mouth -severe or watery diarrhea -unusually weak or tired -yellowing of the eyes, skin Side effects that usually do not require medical attention (report to your doctor or health care professional if they continue or are bothersome): -gas or heartburn -genital  or anal irritation -headache -joint or muscle pain -nausea, vomiting This list may not describe all possible side effects. Call your doctor for medical advice about side effects. You may report side effects to FDA at 1-800-FDA-1088. Where should I keep my medicine? Keep out of the reach of children. Store at room temperature between 59 and 86 degrees F (15 and 30 degrees C). Throw away any unused medicine after the expiration date. NOTE: This sheet is a summary. It may not cover all possible information. If you have questions about this medicine, talk to your doctor, pharmacist, or health care provider.  2018 Elsevier/Gold Standard (2008-02-21 17:09:13)

## 2018-08-25 NOTE — Progress Notes (Signed)
Cardiology Office Note:    Date:  08/25/2018   ID:  Lance Ayala, DOB [birth year], MRN [id number]  PCP:  Anselmo Pickler, MD  Cardiologist:  Norman Herrlich, MD    Referring MD: Anselmo Pickler, *    ASSESSMENT:    1. Chronic combined systolic and diastolic heart failure (HCC)   2. Hypertensive heart disease with heart failure (HCC)   3. Coronary artery disease involving native coronary artery of native heart with angina pectoris (HCC)   4. Dyslipidemia    PLAN:    In order of problems listed above:  1. Stable he is compensated New York Heart Association class I no fluid overload ejection fraction is improved continue current guideline directed therapy.  I did bring up the issue of transition from ACE to ARNI but with financial limitations I do not think we can succeed. 2. Stable blood pressure target continue current guideline directed therapy 3. Improved after bypass surgery asymptomatic having no angina on current treatment with beta-blocker 4. Stable continue his high intensity statin 5. His current problem seems to be acute bronchitis he is having sputum thick bothersome no fever chills of 1 week course of oral cephalosporin   Next appointment: 3 months   Medication Adjustments/Labs and Tests Ordered: Current medicines are reviewed at length with the patient today.  Concerns regarding medicines are outlined above.  No orders of the defined types were placed in this encounter.  No orders of the defined types were placed in this encounter.   Chief Complaint  Patient presents with  . Congestive Heart Failure  . Coronary Artery Disease    History of Present Illness:     Lance Ayala is a 62 y.o. male with a hx of CAD bypass surgery left bundle branch block and systolic heart failure EF 25% improved to 35-40%  last seen 07/14/18. Compliance with diet, lifestyle and medications: Yes  In general doing well no edema shortness of breath orthopnea chest pain  palpitation or syncope ejection fraction is improved.  He is complaining of persistent sputum in his upper airways is irritating he has a cough thick difficulty getting it out no fever or chills.  He is not wheezing. Past Medical History:  Diagnosis Date  . CAD (coronary artery disease)    a. remote PCI. b. CABG x 4 01/2018.  . Carotid artery disease (HCC)    a. s/p R CEA 12/2017 - followed by vascular.  . Cholelithiasis   . Chronic combined systolic and diastolic CHF (congestive heart failure) (HCC)    a. LVEF 35-40% in 10/2017.  Marland Kitchen COPD (chronic obstructive pulmonary disease) (HCC) 10/21/2017  . Dyslipidemia   . Hypertension 10/21/2017  . LBBB (left bundle branch block)   . Lung nodule    a. lung nodules/mediastinal/axillary lymphadenopathy by CT 10/2017.  Marland Kitchen Myocardial infarction (HCC) 10/21/2017  . TIA (transient ischemic attack)    " Mini stroke in 2000"  . Wears dentures     Past Surgical History:  Procedure Laterality Date  . CORONARY ANGIOPLASTY WITH STENT PLACEMENT     2 stents  . CORONARY ARTERY BYPASS GRAFT N/A 01/25/2018   Procedure: CORONARY ARTERY BYPASS GRAFTING (CABG), ON PUMP, TIMES FOUR, USING LEFT INTERNAL MAMMARY ARTERY AND ENDOSCOPICALLY HARVESTED RIGHT GREATER SAPHENOUS VEIN;  Surgeon: Kerin Perna, MD;  Location: [facility] OR;  Service: Open Heart Surgery;  Laterality: N/A;  . ENDARTERECTOMY Right 12/28/2017   Procedure: RIGHT CAROTID ENDARTERECTOMY;  Surgeon: [name], MD;  Location: [facility]  OR;  Service: Vascular;  Laterality: Right;  . LEFT HEART CATH AND CORONARY ANGIOGRAPHY N/A 10/29/2017   Procedure: LEFT HEART CATH AND CORONARY ANGIOGRAPHY;  Surgeon: Tonny Bollman, MD;  Location: [facility] INVASIVE CV LAB;  Service: Cardiovascular;  Laterality: N/A;  . MULTIPLE TOOTH EXTRACTIONS    . PATCH ANGIOPLASTY Right 12/28/2017   Procedure: WITH  HEMASHIELD  0.3 X 3 IN  USE AS PATCH ANGIOPLASTY;  Surgeon: [name], MD;  Location: [facility] OR;  Service: Vascular;  Laterality:  Right;  . TEE WITHOUT CARDIOVERSION N/A 01/25/2018   Procedure: TRANSESOPHAGEAL ECHOCARDIOGRAM (TEE);  Surgeon: Donata Clay, Theron Arista, MD;  Location: [facility] OR;  Service: Open Heart Surgery;  Laterality: N/A;    Current Medications: Current Meds  Medication Sig  . aspirin EC 81 MG tablet Take 1 tablet (81 mg total) by mouth daily.  Marland Kitchen atorvastatin (LIPITOR) 80 MG tablet TAKE ONE TABLET BY MOUTH EVERY EVENING  . carvedilol (COREG) 12.5 MG tablet Take 0.5 tablets (6.25 mg total) by mouth 2 (two) times daily with a meal.  . furosemide (LASIX) 40 MG tablet Take 1 tablet (40 mg total) by mouth daily.  Marland Kitchen lisinopril (PRINIVIL,ZESTRIL) 10 MG tablet Take 1 tablet (10 mg total) by mouth at bedtime.  Marland Kitchen spironolactone (ALDACTONE) 25 MG tablet Take 0.5 tablets (12.5 mg total) by mouth daily.     Allergies:   Patient has no known allergies.   Social History   Socioeconomic History  . Marital status: Single    Spouse name: Not on file  . Number of children: Not on file  . Years of education: Not on file  . Highest education level: Not on file  Occupational History  . Not on file  Social Needs  . Financial resource strain: Not on file  . Food insecurity:    Worry: Not on file    Inability: Not on file  . Transportation needs:    Medical: Not on file    Non-medical: Not on file  Tobacco Use  . Smoking status: Former Smoker    Packs/day: 1.00    Years: 20.00    Pack years: 20.00    Last attempt to quit: 12/01/1998    Years since quitting: 19.7  . Smokeless tobacco: Never Used  Substance and Sexual Activity  . Alcohol use: No    Frequency: Never  . Drug use: No  . Sexual activity: Not on file  Lifestyle  . Physical activity:    Days per week: Not on file    Minutes per session: Not on file  . Stress: Not on file  Relationships  . Social connections:    Talks on phone: Not on file    Gets together: Not on file    Attends religious service: Not on file    Active member of club or  organization: Not on file    Attends meetings of clubs or organizations: Not on file    Relationship status: Not on file  Other Topics Concern  . Not on file  Social History Narrative  . Not on file     Family History: The patient's family history includes Heart attack in his father; Hypertension in his mother; Lung disease in his brother; Stroke in his mother. ROS:   Please see the history of present illness.    All other systems reviewed and are negative.  EKGs/Labs/Other Studies Reviewed:    The following studies were reviewed today:    Recent Labs: 01/26/2018: Magnesium 2.3 02/16/2018:  ALT 18; Hemoglobin 11.6; Platelets 417 08/03/2018: BUN 19; Creatinine, Ser 1.11; NT-Pro BNP 1,028; Potassium 4.5; Sodium 138  Recent Lipid Panel    Component Value Date/Time   CHOL 124 02/16/2018 1133   TRIG 136 02/16/2018 1133   HDL 39 (L) 02/16/2018 1133   CHOLHDL 3.2 02/16/2018 1133   LDLCALC 58 02/16/2018 1133    Physical Exam:    VS:  BP 132/88 (BP Location: Right Arm, Patient Position: Sitting, Cuff Size: Normal)   Pulse 99   Ht 5\' 6"  (1.676 m)   Wt 163 lb 9.6 oz (74.2 kg)   SpO2 95%   BMI 26.41 kg/m     Wt Readings from Last 3 Encounters:  08/25/18 163 lb 9.6 oz (74.2 kg)  07/14/18 166 lb 12.8 oz (75.7 kg)  04/22/18 157 lb 1.9 oz (71.3 kg)     GEN:  Well nourished, well developed in no acute distress HEENT: Normal NECK: No JVD; No carotid bruits LYMPHATICS: No lymphadenopathy CARDIAC: RRR, no murmurs, rubs, gallops RESPIRATORY:  Clear to auscultation without rales, wheezing or rhonchi  ABDOMEN: Soft, non-tender, non-distended MUSCULOSKELETAL:  No edema; No deformity  SKIN: Warm and dry NEUROLOGIC:  Alert and oriented x 3 PSYCHIATRIC:  Normal affect    Signed, Norman Herrlich, MD  08/25/2018 2:27 PM    [facility]

## 2018-08-31 ENCOUNTER — Encounter: Payer: Self-pay | Admitting: Vascular Surgery

## 2018-08-31 ENCOUNTER — Other Ambulatory Visit: Payer: Self-pay

## 2018-08-31 ENCOUNTER — Ambulatory Visit (HOSPITAL_COMMUNITY)
Admission: RE | Admit: 2018-08-31 | Discharge: 2018-08-31 | Disposition: A | Discharge status: Home / Self Care | Payer: Medicaid Other | Source: Ambulatory Visit | Attending: Vascular Surgery | Admitting: Vascular Surgery

## 2018-08-31 ENCOUNTER — Ambulatory Visit (INDEPENDENT_AMBULATORY_CARE_PROVIDER_SITE_OTHER): Payer: Medicaid Other | Admitting: Vascular Surgery

## 2018-08-31 VITALS — BP 129/81 | HR 69 | Temp 98.2°F | Resp 18 | Ht 66.0 in | Wt 160.0 lb

## 2018-08-31 DIAGNOSIS — I6523 Occlusion and stenosis of bilateral carotid arteries: Secondary | ICD-10-CM | POA: Insufficient documentation

## 2018-08-31 DIAGNOSIS — I2510 Atherosclerotic heart disease of native coronary artery without angina pectoris: Secondary | ICD-10-CM | POA: Insufficient documentation

## 2018-08-31 DIAGNOSIS — Z87891 Personal history of nicotine dependence: Secondary | ICD-10-CM | POA: Diagnosis not present

## 2018-08-31 DIAGNOSIS — I10 Essential (primary) hypertension: Secondary | ICD-10-CM | POA: Insufficient documentation

## 2018-08-31 NOTE — Progress Notes (Signed)
[facility] [place]  Patient name: Lance Ayala MRN: [id number] DOB: 1956-02-01 Sex: male  REASON FOR VISIT: Follow-up carotid disease  HPI: Lance Ayala is a 62 y.o. male here today for follow-up.  He was found to have severe critical right carotid stenosis and moderate to severe left carotid stenosis.  He underwent right carotid endarterectomy by planned coronary artery bypass grafting.  He has done well.  He denies any neurologic deficits.  No amaurosis fugax, transient ischemic attack or stroke.  Has remained stable from a cardiac standpoint.  Does report some persistent peri-incisional numbness in his right neck.  Past Medical History:  Diagnosis Date  . CAD (coronary artery disease)    a. remote PCI. b. CABG x 4 01/2018.  . Carotid artery disease (HCC)    a. s/p R CEA 12/2017 - followed by vascular.  . Cholelithiasis   . Chronic combined systolic and diastolic CHF (congestive heart failure) (HCC)    a. LVEF 35-40% in 10/2017.  Marland Kitchen COPD (chronic obstructive pulmonary disease) (HCC) 10/21/2017  . Dyslipidemia   . Hypertension 10/21/2017  . LBBB (left bundle branch block)   . Lung nodule    a. lung nodules/mediastinal/axillary lymphadenopathy by CT 10/2017.  Marland Kitchen Myocardial infarction (HCC) 10/21/2017  . TIA (transient ischemic attack)    " Mini stroke in 2000"  . Wears dentures     Family History  Problem Relation Age of Onset  . Hypertension Mother   . Stroke Mother   . Heart attack Father   . Lung disease Brother     SOCIAL HISTORY: Social History   Tobacco Use  . Smoking status: Former Smoker    Packs/day: 1.00    Years: 20.00    Pack years: 20.00    Last attempt to quit: 12/01/1998    Years since quitting: 19.7  . Smokeless tobacco: Never Used  Substance Use Topics  . Alcohol use: No    Frequency: Never    No Known Allergies  Current Outpatient Medications  Medication Sig Dispense Refill  . aspirin  EC 81 MG tablet Take 1 tablet (81 mg total) by mouth daily. 90 tablet 3  . atorvastatin (LIPITOR) 80 MG tablet TAKE ONE TABLET BY MOUTH EVERY EVENING 90 tablet 1  . carvedilol (COREG) 12.5 MG tablet Take 0.5 tablets (6.25 mg total) by mouth 2 (two) times daily with a meal. 45 tablet 3  . carvedilol (COREG) 6.25 MG tablet Take 6.25 mg by mouth 2 (two) times daily with a meal.  3  . cephALEXin (KEFLEX) 500 MG capsule Take 1 capsule (500 mg total) by mouth 4 (four) times daily. 28 capsule 0  . furosemide (LASIX) 40 MG tablet Take 1 tablet (40 mg total) by mouth daily. 90 tablet 3  . lisinopril (PRINIVIL,ZESTRIL) 10 MG tablet Take 1 tablet (10 mg total) by mouth at bedtime. 90 tablet 1  . spironolactone (ALDACTONE) 25 MG tablet Take 0.5 tablets (12.5 mg total) by mouth daily. 90 tablet 3   No current facility-administered medications for this visit.     REVIEW OF SYSTEMS:  [X]  denotes positive finding, [ ]  denotes negative finding Cardiac  Comments:  Chest pain or chest pressure:    Shortness of breath upon exertion:    Short of breath when lying flat:    Irregular heart rhythm:        Vascular    Pain in calf, thigh, or hip brought on by ambulation:    Pain  in feet at night that wakes you up from your sleep:     Blood clot in your veins:    Leg swelling:           PHYSICAL EXAM: Vitals:   08/31/18 1145 08/31/18 1147 08/31/18 1149  BP: (!) 146/89 (!) 153/76 129/81  Pulse: 74 69 69  Resp: 18    Temp: 98.2 F (36.8 C)    TempSrc: Oral    SpO2: 96%    Weight: 160 lb (72.6 kg)    Height: 5\' 6"  (1.676 m)      GENERAL: The patient is a well-nourished male, in no acute distress. The vital signs are documented above. CARDIOVASCULAR: Right carotid incision well-healed with no carotid bruits bilaterally PULMONARY: There is good air exchange  MUSCULOSKELETAL: There are no major deformities or cyanosis. NEUROLOGIC: No focal weakness or paresthesias are detected. SKIN: There are no  ulcers or rashes noted. PSYCHIATRIC: The patient has a normal affect.  DATA:  Carotid duplex today reveals 40 to 59% some narrowing in the right endarterectomy with heterogeneous probable intimal hyperplasia.  On the left, he has 60 to 79% stenosis with diastolic velocity of 94 cm/s  MEDICAL ISSUES: Stable follow-up of right endarterectomy on 12/28/2017.  Will see him again in 6 months with continued surveillance of his left moderate to severe stenosis.  I again reviewed symptoms of carotid disease and he knows to report immediately should this occur.    [name], MD FACS [facility] Tel [phone] Pager [phone]

## 2018-09-09 DIAGNOSIS — Z736 Limitation of activities due to disability: Secondary | ICD-10-CM

## 2018-10-27 ENCOUNTER — Other Ambulatory Visit: Payer: Self-pay | Admitting: Cardiology

## 2018-11-02 ENCOUNTER — Telehealth: Payer: Self-pay | Admitting: Emergency Medicine

## 2018-11-02 NOTE — Telephone Encounter (Signed)
Patient came into office yesterday 11/01/18 concerned about being sore from surgical site from CABG in March 2019, he reports he went to the emergency department for this and they told him to follow up with Dr. Dulce Sellar. Patient advised to see pcp and reach out to surgeon and follow up with Dr.Munley as scheduled on 11/18/18. Advised him to call office with any other questions or concerns.

## 2018-11-17 NOTE — Progress Notes (Addendum)
Cardiology Office Note:    Date:  11/18/2018   ID:  Lance Ayala, DOB 1956-05-10, MRN [id number]  PCP:  Anselmo Pickler, MD  Cardiologist:  Norman Herrlich, MD    Referring MD: Anselmo Pickler, *    ASSESSMENT:    1. Chronic combined systolic and diastolic heart failure (HCC)   2. Coronary artery disease involving native coronary artery of native heart with angina pectoris (HCC)   3. Hypertensive heart disease with heart failure (HCC)   4. Dyslipidemia   5. Bronchitis    PLAN:    In order of problems listed above:  1. His heart failure is compensated, New York Heart Association class I continue his current loop diuretic and access recently performed labs both in the emergency room and PCP office.  Continue current guideline directed therapy including beta-blocker MRA ACE inhibitor 2. Stable CAD continue medical treatment aspirin high intensity statin 3. Stable blood pressure target continue current treatment 4. Continue statin access recent labs for liver function lipid profile for efficacy 5. Short course of oral antibiotic amoxicillin I suspect he will improve   Next appointment: 6 months   Medication Adjustments/Labs and Tests Ordered: Current medicines are reviewed at length with the patient today.  Concerns regarding medicines are outlined above.  No orders of the defined types were placed in this encounter.  Meds ordered this encounter  Medications  . amoxicillin (AMOXIL) 500 MG capsule    Sig: Take 1 capsule (500 mg total) by mouth 3 (three) times daily.    Dispense:  21 capsule    Refill:  1    Chief Complaint  Patient presents with  . Congestive Heart Failure  . Coronary Artery Disease    History of Present Illness:    Lance Ayala is a 62 y.o. male with a hx of CAD bypass surgery left bundle branch block and systolic heart failure EF 25% improved to 35-40%  last seen 08/25/18. Compliance with diet, lifestyle and medications: yes He is  struggling he has had a purulent cough for several weeks he finds it very bothersome he is used cough syrup prescribed he is using over-the-counter Robitussin and Mucinex and it just has not helped he had an emergency room visit at [facility] they did not treat him for respiratory infection his chest x-ray apparently showed a fractured sternal wire and he had lab work performed yesterday his PCP office.  No edema orthopnea palpitations syncope or chest pain he is short of breath but he attributes it to this purulent respiratory infection cough.  As he has had multiple medical interactions and ongoing symptoms and a place him on oral antibiotic amoxicillin for 1 week.  Records requested from both [facility] and his PCP Past Medical History:  Diagnosis Date  . CAD (coronary artery disease)    a. remote PCI. b. CABG x 4 01/2018.  . Carotid artery disease (HCC)    a. s/p R CEA 12/2017 - followed by vascular.  . Cholelithiasis   . Chronic combined systolic and diastolic CHF (congestive heart failure) (HCC)    a. LVEF 35-40% in 10/2017.  Marland Kitchen COPD (chronic obstructive pulmonary disease) (HCC) 10/21/2017  . Dyslipidemia   . Hypertension 10/21/2017  . LBBB (left bundle branch block)   . Lung nodule    a. lung nodules/mediastinal/axillary lymphadenopathy by CT 10/2017.  Marland Kitchen Myocardial infarction (HCC) 10/21/2017  . TIA (transient ischemic attack)    " Mini stroke in 2000"  . Wears dentures  Past Surgical History:  Procedure Laterality Date  . CORONARY ANGIOPLASTY WITH STENT PLACEMENT     2 stents  . CORONARY ARTERY BYPASS GRAFT N/A 01/25/2018   Procedure: CORONARY ARTERY BYPASS GRAFTING (CABG), ON PUMP, TIMES FOUR, USING LEFT INTERNAL MAMMARY ARTERY AND ENDOSCOPICALLY HARVESTED RIGHT GREATER SAPHENOUS VEIN;  Surgeon: Kerin Perna, MD;  Location: [facility];  Service: Open Heart Surgery;  Laterality: N/A;  . ENDARTERECTOMY Right 12/28/2017   Procedure: RIGHT CAROTID ENDARTERECTOMY;   Surgeon: [name], MD;  Location: [facility] OR;  Service: Vascular;  Laterality: Right;  . LEFT HEART CATH AND CORONARY ANGIOGRAPHY N/A 10/29/2017   Procedure: LEFT HEART CATH AND CORONARY ANGIOGRAPHY;  Surgeon: Tonny Bollman, MD;  Location: [facility] INVASIVE CV LAB;  Service: Cardiovascular;  Laterality: N/A;  . MULTIPLE TOOTH EXTRACTIONS    . PATCH ANGIOPLASTY Right 12/28/2017   Procedure: WITH  HEMASHIELD  0.3 X 3 IN  USE AS PATCH ANGIOPLASTY;  Surgeon: [name], MD;  Location: [facility] OR;  Service: Vascular;  Laterality: Right;  . TEE WITHOUT CARDIOVERSION N/A 01/25/2018   Procedure: TRANSESOPHAGEAL ECHOCARDIOGRAM (TEE);  Surgeon: Donata Clay, Theron Arista, MD;  Location: [facility] OR;  Service: Open Heart Surgery;  Laterality: N/A;    Current Medications: Current Meds  Medication Sig  . aspirin EC 81 MG tablet Take 1 tablet (81 mg total) by mouth daily.  Marland Kitchen atorvastatin (LIPITOR) 80 MG tablet TAKE ONE TABLET BY MOUTH EVERY EVENING  . carvedilol (COREG) 6.25 MG tablet Take 6.25 mg by mouth 2 (two) times daily with a meal.  . furosemide (LASIX) 40 MG tablet Take 1 tablet (40 mg total) by mouth daily.  Marland Kitchen lisinopril (PRINIVIL,ZESTRIL) 10 MG tablet Take 1 tablet (10 mg total) by mouth at bedtime.  Marland Kitchen spironolactone (ALDACTONE) 25 MG tablet Take 0.5 tablets (12.5 mg total) by mouth daily.     Allergies:   Patient has no known allergies.   Social History   Socioeconomic History  . Marital status: Single    Spouse name: Not on file  . Number of children: Not on file  . Years of education: Not on file  . Highest education level: Not on file  Occupational History  . Not on file  Social Needs  . Financial resource strain: Not on file  . Food insecurity:    Worry: Not on file    Inability: Not on file  . Transportation needs:    Medical: Not on file    Non-medical: Not on file  Tobacco Use  . Smoking status: Former Smoker    Packs/day: 1.00    Years: 20.00    Pack years: 20.00    Last attempt to quit:  12/01/1998    Years since quitting: 19.9  . Smokeless tobacco: Never Used  Substance and Sexual Activity  . Alcohol use: No    Frequency: Never  . Drug use: No  . Sexual activity: Not on file  Lifestyle  . Physical activity:    Days per week: Not on file    Minutes per session: Not on file  . Stress: Not on file  Relationships  . Social connections:    Talks on phone: Not on file    Gets together: Not on file    Attends religious service: Not on file    Active member of club or organization: Not on file    Attends meetings of clubs or organizations: Not on file    Relationship status: Not on file  Other  Topics Concern  . Not on file  Social History Narrative  . Not on file     Family History: The patient's family history includes Heart attack in his father; Hypertension in his mother; Lung disease in his brother; Stroke in his mother. ROS:   Please see the history of present illness.    All other systems reviewed and are negative.  EKGs/Labs/Other Studies Reviewed:    The following studies were reviewed today  Recent Labs: [facility] records reviewed from 11/01/2018 his CBC was normal BMP was normal potassium 3.9 proBNP level was elevated at 764 consistent with his known history of cardiomyopathy and heart failure chest x-ray did not show pneumonia. 01/26/2018: Magnesium 2.3 02/16/2018: ALT 18; Hemoglobin 11.6; Platelets 417 08/03/2018: BUN 19; Creatinine, Ser 1.11; NT-Pro BNP 1,028; Potassium 4.5; Sodium 138  Recent Lipid Panel    Component Value Date/Time   CHOL 124 02/16/2018 1133   TRIG 136 02/16/2018 1133   HDL 39 (L) 02/16/2018 1133   CHOLHDL 3.2 02/16/2018 1133   LDLCALC 58 02/16/2018 1133    Physical Exam:    VS:  BP 100/84 (BP Location: Right Arm, Patient Position: Sitting, Cuff Size: Normal)   Pulse 80   Ht 5\' 6"  (1.676 m)   Wt 160 lb 8 oz (72.8 kg)   SpO2 94%   BMI 25.91 kg/m     Wt Readings from Last 3 Encounters:  11/18/18 160 lb 8 oz (72.8  kg)  08/31/18 160 lb (72.6 kg)  08/25/18 163 lb 9.6 oz (74.2 kg)     GEN:  Well nourished, well developed in no acute distress HEENT: Normal NECK: No JVD; No carotid bruits LYMPHATICS: No lymphadenopathy CARDIAC: RRR, no murmurs, rubs, gallops RESPIRATORY:  Clear to auscultation without rales, wheezing or rhonchi he does have diffusely decreased breath sounds ABDOMEN: Soft, non-tender, non-distended MUSCULOSKELETAL:  No edema; No deformity  SKIN: Warm and dry NEUROLOGIC:  Alert and oriented x 3 PSYCHIATRIC:  Normal affect    Signed, Norman Herrlich, MD  11/18/2018 10:11 AM    [facility]

## 2018-11-18 ENCOUNTER — Ambulatory Visit (INDEPENDENT_AMBULATORY_CARE_PROVIDER_SITE_OTHER): Payer: Medicaid Other | Admitting: Cardiology

## 2018-11-18 ENCOUNTER — Encounter: Payer: Self-pay | Admitting: Cardiology

## 2018-11-18 VITALS — BP 100/84 | HR 80 | Ht 66.0 in | Wt 160.5 lb

## 2018-11-18 DIAGNOSIS — I25119 Atherosclerotic heart disease of native coronary artery with unspecified angina pectoris: Secondary | ICD-10-CM | POA: Diagnosis not present

## 2018-11-18 DIAGNOSIS — J40 Bronchitis, not specified as acute or chronic: Secondary | ICD-10-CM

## 2018-11-18 DIAGNOSIS — I5042 Chronic combined systolic (congestive) and diastolic (congestive) heart failure: Secondary | ICD-10-CM | POA: Diagnosis not present

## 2018-11-18 DIAGNOSIS — I110 Hypertensive heart disease with heart failure: Secondary | ICD-10-CM | POA: Diagnosis not present

## 2018-11-18 DIAGNOSIS — E785 Hyperlipidemia, unspecified: Secondary | ICD-10-CM | POA: Diagnosis not present

## 2018-11-18 MED ORDER — AMOXICILLIN 500 MG PO CAPS: 500.0000 mg | ORAL_CAPSULE | Freq: Three times a day (TID) | ORAL | 1 refills | Status: DC

## 2018-11-18 NOTE — Patient Instructions (Signed)
Medication Instructions:  Your physician has recommended you make the following change in your medication:  Start amoxicillin 500mg  three times daily.   If you need a refill on your cardiac medications before your next appointment, please call your pharmacy.   Lab work: None  If you have labs (blood work) drawn today and your tests are completely normal, you will receive your results only by: Marland Kitchen MyChart Message (if you have MyChart) OR . A paper copy in the mail If you have any lab test that is abnormal or we need to change your treatment, we will call you to review the results.  Testing/Procedures: None  Follow-Up: At [facility], you and your health needs are our priority.  As part of our continuing mission to provide you with exceptional heart care, we have created designated Provider Care Teams.  These Care Teams include your primary Cardiologist (physician) and Advanced Practice Providers (APPs -  Physician Assistants and Nurse Practitioners) who all work together to provide you with the care you need, when you need it. You will need a follow up appointment in 6 months.  Please call our office 2 months in advance to schedule this appointment.

## 2019-01-03 ENCOUNTER — Other Ambulatory Visit: Payer: Self-pay | Admitting: Cardiology

## 2019-01-13 ENCOUNTER — Other Ambulatory Visit: Payer: Self-pay | Admitting: Cardiology

## 2019-02-01 ENCOUNTER — Other Ambulatory Visit: Payer: Self-pay | Admitting: Cardiology

## 2019-03-02 ENCOUNTER — Encounter (HOSPITAL_COMMUNITY): Payer: Medicaid Other

## 2019-03-02 ENCOUNTER — Ambulatory Visit: Payer: Medicaid Other | Admitting: Family

## 2019-03-03 ENCOUNTER — Encounter (HOSPITAL_COMMUNITY): Payer: Medicaid Other

## 2019-03-03 ENCOUNTER — Ambulatory Visit: Payer: Medicaid Other | Admitting: Family

## 2019-03-29 ENCOUNTER — Other Ambulatory Visit: Payer: Self-pay | Admitting: Cardiology

## 2021-07-03 ENCOUNTER — Encounter (HOSPITAL_COMMUNITY): Payer: Self-pay | Admitting: Cardiology

## 2021-07-03 ENCOUNTER — Encounter (HOSPITAL_COMMUNITY)
Admission: RE | Disposition: A | Discharge status: Home / Self Care | Payer: Self-pay | Source: Skilled Nursing Facility | Attending: Cardiology

## 2021-07-03 ENCOUNTER — Inpatient Hospital Stay (HOSPITAL_COMMUNITY)
Admission: RE | Admit: 2021-07-03 | Discharge: 2021-07-06 | DRG: 246 | Disposition: A | Discharge status: Home / Self Care | Payer: Medicare Other | Source: Skilled Nursing Facility | Attending: Cardiology | Admitting: Cardiology

## 2021-07-03 ENCOUNTER — Other Ambulatory Visit: Payer: Self-pay

## 2021-07-03 DIAGNOSIS — Z79899 Other long term (current) drug therapy: Secondary | ICD-10-CM

## 2021-07-03 DIAGNOSIS — J449 Chronic obstructive pulmonary disease, unspecified: Secondary | ICD-10-CM | POA: Diagnosis present

## 2021-07-03 DIAGNOSIS — I959 Hypotension, unspecified: Secondary | ICD-10-CM | POA: Diagnosis present

## 2021-07-03 DIAGNOSIS — I2119 ST elevation (STEMI) myocardial infarction involving other coronary artery of inferior wall: Secondary | ICD-10-CM

## 2021-07-03 DIAGNOSIS — I6523 Occlusion and stenosis of bilateral carotid arteries: Secondary | ICD-10-CM | POA: Diagnosis present

## 2021-07-03 DIAGNOSIS — I255 Ischemic cardiomyopathy: Secondary | ICD-10-CM

## 2021-07-03 DIAGNOSIS — Z8673 Personal history of transient ischemic attack (TIA), and cerebral infarction without residual deficits: Secondary | ICD-10-CM

## 2021-07-03 DIAGNOSIS — I10 Essential (primary) hypertension: Secondary | ICD-10-CM | POA: Diagnosis not present

## 2021-07-03 DIAGNOSIS — I6521 Occlusion and stenosis of right carotid artery: Secondary | ICD-10-CM | POA: Diagnosis not present

## 2021-07-03 DIAGNOSIS — I252 Old myocardial infarction: Secondary | ICD-10-CM | POA: Diagnosis not present

## 2021-07-03 DIAGNOSIS — I5042 Chronic combined systolic (congestive) and diastolic (congestive) heart failure: Secondary | ICD-10-CM | POA: Diagnosis not present

## 2021-07-03 DIAGNOSIS — I25810 Atherosclerosis of coronary artery bypass graft(s) without angina pectoris: Secondary | ICD-10-CM | POA: Diagnosis present

## 2021-07-03 DIAGNOSIS — Z955 Presence of coronary angioplasty implant and graft: Secondary | ICD-10-CM

## 2021-07-03 DIAGNOSIS — Z20822 Contact with and (suspected) exposure to covid-19: Secondary | ICD-10-CM | POA: Diagnosis present

## 2021-07-03 DIAGNOSIS — I2111 ST elevation (STEMI) myocardial infarction involving right coronary artery: Secondary | ICD-10-CM

## 2021-07-03 DIAGNOSIS — Z951 Presence of aortocoronary bypass graft: Secondary | ICD-10-CM | POA: Diagnosis not present

## 2021-07-03 DIAGNOSIS — I447 Left bundle-branch block, unspecified: Secondary | ICD-10-CM | POA: Diagnosis present

## 2021-07-03 DIAGNOSIS — I5043 Acute on chronic combined systolic (congestive) and diastolic (congestive) heart failure: Secondary | ICD-10-CM | POA: Diagnosis present

## 2021-07-03 DIAGNOSIS — I2510 Atherosclerotic heart disease of native coronary artery without angina pectoris: Secondary | ICD-10-CM | POA: Diagnosis present

## 2021-07-03 DIAGNOSIS — I6529 Occlusion and stenosis of unspecified carotid artery: Secondary | ICD-10-CM | POA: Diagnosis not present

## 2021-07-03 DIAGNOSIS — I110 Hypertensive heart disease with heart failure: Secondary | ICD-10-CM | POA: Diagnosis present

## 2021-07-03 DIAGNOSIS — E785 Hyperlipidemia, unspecified: Secondary | ICD-10-CM

## 2021-07-03 DIAGNOSIS — I25119 Atherosclerotic heart disease of native coronary artery with unspecified angina pectoris: Secondary | ICD-10-CM

## 2021-07-03 DIAGNOSIS — E782 Mixed hyperlipidemia: Secondary | ICD-10-CM | POA: Diagnosis not present

## 2021-07-03 HISTORY — DX: ST elevation (STEMI) myocardial infarction involving other coronary artery of inferior wall: I21.19

## 2021-07-03 HISTORY — PX: LEFT HEART CATH AND CORONARY ANGIOGRAPHY: CATH118249

## 2021-07-03 HISTORY — PX: CORONARY STENT INTERVENTION: CATH118234

## 2021-07-03 LAB — MRSA NEXT GEN BY PCR, NASAL: MRSA by PCR Next Gen: NOT DETECTED

## 2021-07-03 LAB — TROPONIN I (HIGH SENSITIVITY): Troponin I (High Sensitivity): 19387 ng/L (ref <18)

## 2021-07-03 LAB — POCT ACTIVATED CLOTTING TIME: Activated Clotting Time: 271 seconds

## 2021-07-03 SURGERY — LEFT HEART CATH AND CORONARY ANGIOGRAPHY
Anesthesia: LOCAL | Laterality: Right

## 2021-07-03 MED ORDER — IOHEXOL 350 MG/ML SOLN
INTRAVENOUS | Status: DC | PRN
  Administered 2021-07-03: 190 mL

## 2021-07-03 MED ORDER — SODIUM CHLORIDE 0.9% FLUSH
3.0000 mL | Freq: Two times a day (BID) | INTRAVENOUS | Status: DC
  Administered 2021-07-03 – 2021-07-06 (×6): 3 mL via INTRAVENOUS

## 2021-07-03 MED ORDER — HYDRALAZINE HCL 20 MG/ML IJ SOLN
10.0000 mg | INTRAMUSCULAR | Status: AC | PRN
Start: 2021-07-03 — End: 2021-07-03

## 2021-07-03 MED ORDER — HEPARIN SODIUM (PORCINE) 1000 UNIT/ML IJ SOLN
INTRAMUSCULAR | Status: DC | PRN
  Administered 2021-07-03: 2000 [IU] via INTRAVENOUS
  Administered 2021-07-03: 6000 [IU] via INTRAVENOUS

## 2021-07-03 MED ORDER — FUROSEMIDE 40 MG PO TABS
40.0000 mg | ORAL_TABLET | Freq: Every day | ORAL | Status: DC
  Administered 2021-07-04 – 2021-07-06 (×3): 40 mg via ORAL
  Filled 2021-07-03 (×3): qty 1

## 2021-07-03 MED ORDER — METOPROLOL TARTRATE 5 MG/5ML IV SOLN
INTRAVENOUS | Status: DC | PRN
Start: 2021-07-03 — End: 2021-07-03
  Administered 2021-07-03: 5 mg via INTRAVENOUS

## 2021-07-03 MED ORDER — ACETAMINOPHEN 325 MG PO TABS: 650.0000 mg | ORAL_TABLET | ORAL | Status: DC | PRN

## 2021-07-03 MED ORDER — MIDAZOLAM HCL 2 MG/2ML IJ SOLN
INTRAMUSCULAR | Status: AC
  Filled 2021-07-03: qty 2

## 2021-07-03 MED ORDER — FENTANYL CITRATE (PF) 100 MCG/2ML IJ SOLN
INTRAMUSCULAR | Status: DC | PRN
  Administered 2021-07-03: 25 ug via INTRAVENOUS

## 2021-07-03 MED ORDER — FUROSEMIDE 10 MG/ML IJ SOLN
INTRAMUSCULAR | Status: AC
  Filled 2021-07-03: qty 4

## 2021-07-03 MED ORDER — ASPIRIN EC 81 MG PO TBEC
81.0000 mg | DELAYED_RELEASE_TABLET | Freq: Every day | ORAL | Status: DC
  Administered 2021-07-04 – 2021-07-06 (×3): 81 mg via ORAL
  Filled 2021-07-03 (×3): qty 1

## 2021-07-03 MED ORDER — FENTANYL CITRATE (PF) 100 MCG/2ML IJ SOLN
INTRAMUSCULAR | Status: AC
  Filled 2021-07-03: qty 2

## 2021-07-03 MED ORDER — SODIUM CHLORIDE 0.9 % IV SOLN: INTRAVENOUS | Status: AC

## 2021-07-03 MED ORDER — SODIUM CHLORIDE 0.9 % IV SOLN: 250.0000 mL | INTRAVENOUS | Status: DC | PRN

## 2021-07-03 MED ORDER — MIDAZOLAM HCL 2 MG/2ML IJ SOLN
INTRAMUSCULAR | Status: DC | PRN
  Administered 2021-07-03 (×2): 1 mg via INTRAVENOUS

## 2021-07-03 MED ORDER — ATORVASTATIN CALCIUM 80 MG PO TABS
80.0000 mg | ORAL_TABLET | Freq: Every evening | ORAL | Status: DC
  Administered 2021-07-03 – 2021-07-05 (×3): 80 mg via ORAL
  Filled 2021-07-03 (×4): qty 1

## 2021-07-03 MED ORDER — SPIRONOLACTONE 25 MG PO TABS
25.0000 mg | ORAL_TABLET | Freq: Every day | ORAL | Status: DC
  Administered 2021-07-04 – 2021-07-06 (×3): 25 mg via ORAL
  Filled 2021-07-03 (×3): qty 1

## 2021-07-03 MED ORDER — FUROSEMIDE 10 MG/ML IJ SOLN
INTRAMUSCULAR | Status: DC | PRN
Start: 2021-07-03 — End: 2021-07-03
  Administered 2021-07-03: 40 mg via INTRAVENOUS

## 2021-07-03 MED ORDER — HEPARIN SODIUM (PORCINE) 1000 UNIT/ML IJ SOLN
INTRAMUSCULAR | Status: AC
  Filled 2021-07-03: qty 1

## 2021-07-03 MED ORDER — CHLORHEXIDINE GLUCONATE CLOTH 2 % EX PADS
6.0000 | MEDICATED_PAD | Freq: Every day | CUTANEOUS | Status: DC
  Administered 2021-07-03 – 2021-07-05 (×3): 6 via TOPICAL

## 2021-07-03 MED ORDER — SODIUM CHLORIDE 0.9 % IV SOLN
INTRAVENOUS | Status: AC | PRN
  Administered 2021-07-03: 10 mL/h via INTRAVENOUS

## 2021-07-03 MED ORDER — LIDOCAINE HCL (PF) 1 % IJ SOLN
INTRAMUSCULAR | Status: DC | PRN
  Administered 2021-07-03: 15 mL

## 2021-07-03 MED ORDER — LOSARTAN POTASSIUM 25 MG PO TABS
25.0000 mg | ORAL_TABLET | Freq: Every day | ORAL | Status: DC
  Administered 2021-07-04 – 2021-07-06 (×3): 25 mg via ORAL
  Filled 2021-07-03 (×3): qty 1

## 2021-07-03 MED ORDER — SODIUM CHLORIDE 0.9% FLUSH: 3.0000 mL | INTRAVENOUS | Status: DC | PRN

## 2021-07-03 MED ORDER — LABETALOL HCL 5 MG/ML IV SOLN: 10.0000 mg | INTRAVENOUS | Status: AC | PRN

## 2021-07-03 MED ORDER — METOPROLOL TARTRATE 5 MG/5ML IV SOLN
INTRAVENOUS | Status: AC
  Filled 2021-07-03: qty 5

## 2021-07-03 MED ORDER — TICAGRELOR 90 MG PO TABS
90.0000 mg | ORAL_TABLET | Freq: Two times a day (BID) | ORAL | Status: DC
  Administered 2021-07-03 – 2021-07-06 (×6): 90 mg via ORAL
  Filled 2021-07-03 (×6): qty 1

## 2021-07-03 MED ORDER — HEPARIN (PORCINE) IN NACL 1000-0.9 UT/500ML-% IV SOLN
INTRAVENOUS | Status: DC | PRN
  Administered 2021-07-03 (×3): 500 mL

## 2021-07-03 MED ORDER — CARVEDILOL 6.25 MG PO TABS
6.2500 mg | ORAL_TABLET | Freq: Two times a day (BID) | ORAL | Status: DC
  Administered 2021-07-03 – 2021-07-04 (×2): 6.25 mg via ORAL
  Filled 2021-07-03 (×2): qty 1

## 2021-07-03 MED ORDER — FUROSEMIDE 10 MG/ML IJ SOLN
40.0000 mg | Freq: Once | INTRAMUSCULAR | Status: AC
  Administered 2021-07-03: 40 mg via INTRAVENOUS
  Filled 2021-07-03: qty 4

## 2021-07-03 MED ORDER — ONDANSETRON HCL 4 MG/2ML IJ SOLN: 4.0000 mg | Freq: Four times a day (QID) | INTRAMUSCULAR | Status: DC | PRN

## 2021-07-03 SURGICAL SUPPLY — 21 items
BALLN SAPPHIRE 2.0X12 (BALLOONS) ×3
BALLOON SAPPHIRE 2.0X12 (BALLOONS) ×2 IMPLANT
CATH INFINITI 5FR AL1 (CATHETERS) ×3 IMPLANT
CATH INFINITI 5FR MULTPACK ANG (CATHETERS) ×3 IMPLANT
CATH VISTA GUIDE 6FR JR4 (CATHETERS) ×3 IMPLANT
CLOSURE MYNX CONTROL 6F/7F (Vascular Products) ×3 IMPLANT
ELECT DEFIB PAD ADLT CADENCE (PAD) ×3 IMPLANT
KIT ENCORE 26 ADVANTAGE (KITS) ×3 IMPLANT
KIT HEART LEFT (KITS) ×3 IMPLANT
PACK CARDIAC CATHETERIZATION (CUSTOM PROCEDURE TRAY) ×3 IMPLANT
SHEATH PINNACLE 6F 10CM (SHEATH) ×3 IMPLANT
SHEATH PROBE COVER 6X72 (BAG) ×3 IMPLANT
STENT SYNERGY XD 2.50X12 (Permanent Stent) ×2 IMPLANT
STENT SYNERGY XD 2.75X16 (Permanent Stent) ×2 IMPLANT
SYNERGY XD 2.50X12 (Permanent Stent) ×3 IMPLANT
SYNERGY XD 2.75X16 (Permanent Stent) ×3 IMPLANT
SYR MEDRAD MARK 7 150ML (SYRINGE) ×3 IMPLANT
TRANSDUCER W/STOPCOCK (MISCELLANEOUS) ×3 IMPLANT
TUBING CIL FLEX 10 FLL-RA (TUBING) ×3 IMPLANT
WIRE ASAHI PROWATER 180CM (WIRE) ×3 IMPLANT
WIRE EMERALD 3MM-J .035X150CM (WIRE) ×3 IMPLANT

## 2021-07-03 NOTE — Progress Notes (Signed)
Troponin [id number]. MD Lendell Caprice notified. No new orders.

## 2021-07-03 NOTE — H&P (Signed)
Cardiology Admission History and Physical:   Patient ID: Lance Ayala MRN: [id number]; DOB: [birth year]   Admission date: 07/03/2021  PCP:  Anselmo Pickler, MD   [facility] Providers Cardiologist:  Norman Herrlich, MD   {  Chief Complaint:  Chest pain/STEMI  Patient Profile:   Lance Ayala is a 65 y.o. male with CAD s/p CABG '19, chronic combined HF, carotid artery disease (R CEA 12/2017), LBBB, HLD, HTN, lung nodule, COPD, TIA who is being seen 07/03/2021 for the evaluation of chest pain/STEMI.  History of Present Illness:   [name] is a 65 year old male with past medical history noted above.  He has a remote history of PCI.  Evaluated 10/2017 for fatigue and poor exercise tolerance and found to have an LVEF of 25 to 30%.  Underwent cath and found to have multivessel disease.  He was also found to have significant carotid artery disease and underwent right CEA 12/2017 then after recovering from that was brought back for elective CABG x4 on 01/2018 with LIMA to LAD, SVG to PDA, sequential SVG to OM1 and OM 2.  He was discharged on carvedilol, lisinopril and spironolactone.  Uncomplicated post hospital course.  Of note had chest CT 10/2017 which showed 2 pulmonary nodules on the right.  It was recommended for correlation with PET/CT consider.  He was last seen in the office on 10/2018 and reported having persistent cough for several weeks.  Notes indicate he was seen at [facility] and had a chest x-ray which showed a fractured sternal wire.  He was seen in follow-up by his PCP in treated with antibiotics.  He was continued on guideline directed medical therapy.   He presented to [facility] on 07/03/21 with chest pain.  He reports developing chest pain the day prior after being outside doing light yard work.  Symptoms improved that evening and then returned the morning of admission.  Had associated shortness of breath but no nausea vomiting or dizziness.  EKG at [facility]  showed sinus rhythm, 68 bpm, known left bundle branch block with acute ST elevation >56mm in inferior leads.  He was given 324 of aspirin, morphine, Zofran, 5000 units of heparin and loaded with Brilinta 180 mg.  Code STEMI was activated and patient was transferred to [facility] patient on arrival rated his chest pain 8/10.  He was given sublingual nitro while at [facility] with subsequent hypotension which was corrected with IV fluids.  Labs at [place]: Sodium 139 Potassium 4.3 Cr 1.2 AST 119 WBC 9.7 Hemoglobin 14.4 COVID-negative Troponin I was drawn but pending at the time of arrival   Past Medical History:  Diagnosis Date   CAD (coronary artery disease)    a. remote PCI. b. CABG x 4 01/2018.   Carotid artery disease (HCC)    a. s/p R CEA 12/2017 - followed by vascular.   Cholelithiasis    Chronic combined systolic and diastolic CHF (congestive heart failure) (HCC)    a. LVEF 35-40% in 10/2017.   COPD (chronic obstructive pulmonary disease) (HCC) 10/21/2017   Dyslipidemia    Hypertension 10/21/2017   LBBB (left bundle branch block)    Lung nodule    a. lung nodules/mediastinal/axillary lymphadenopathy by CT 10/2017.   Myocardial infarction (HCC) 10/21/2017   TIA (transient ischemic attack)    " Mini stroke in 2000"   Wears dentures     Past Surgical History:  Procedure Laterality Date   CORONARY ANGIOPLASTY WITH STENT PLACEMENT  2 stents   CORONARY ARTERY BYPASS GRAFT N/A 01/25/2018   Procedure: CORONARY ARTERY BYPASS GRAFTING (CABG), ON PUMP, TIMES FOUR, USING LEFT INTERNAL MAMMARY ARTERY AND ENDOSCOPICALLY HARVESTED RIGHT GREATER SAPHENOUS VEIN;  Surgeon: Kerin Perna, MD;  Location: [facility] OR;  Service: Open Heart Surgery;  Laterality: N/A;   ENDARTERECTOMY Right 12/28/2017   Procedure: RIGHT CAROTID ENDARTERECTOMY;  Surgeon: [name], MD;  Location: [facility] OR;  Service: Vascular;  Laterality: Right;   LEFT HEART CATH AND CORONARY ANGIOGRAPHY N/A 10/29/2017    Procedure: LEFT HEART CATH AND CORONARY ANGIOGRAPHY;  Surgeon: Tonny Bollman, MD;  Location: [facility] INVASIVE CV LAB;  Service: Cardiovascular;  Laterality: N/A;   MULTIPLE TOOTH EXTRACTIONS     PATCH ANGIOPLASTY Right 12/28/2017   Procedure: WITH  HEMASHIELD  0.3 X 3 IN  USE AS PATCH ANGIOPLASTY;  Surgeon: [name], MD;  Location: [facility] OR;  Service: Vascular;  Laterality: Right;   TEE WITHOUT CARDIOVERSION N/A 01/25/2018   Procedure: TRANSESOPHAGEAL ECHOCARDIOGRAM (TEE);  Surgeon: Donata Clay, Theron Arista, MD;  Location: [facility] OR;  Service: Open Heart Surgery;  Laterality: N/A;     Medications Prior to Admission: Prior to Admission medications   Medication Sig Start Date End Date Taking? Authorizing Provider  amoxicillin (AMOXIL) 500 MG capsule Take 1 capsule (500 mg total) by mouth 3 (three) times daily. 11/18/18   Baldo Daub, MD  aspirin EC 81 MG tablet Take 1 tablet (81 mg total) by mouth daily. 07/14/18   Baldo Daub, MD  atorvastatin (LIPITOR) 80 MG tablet TAKE ONE TABLET BY MOUTH EVERY EVENING 01/03/19   Baldo Daub, MD  carvedilol (COREG) 3.125 MG tablet TAKE ONE TABLET BY MOUTH TWICE DAILY 03/29/19   Baldo Daub, MD  carvedilol (COREG) 6.25 MG tablet Take 6.25 mg by mouth 2 (two) times daily with a meal. 08/25/18   [provider]  furosemide (LASIX) 40 MG tablet Take 1 tablet (40 mg total) by mouth daily. 07/14/18 11/18/25  Baldo Daub, MD  lisinopril (PRINIVIL,ZESTRIL) 10 MG tablet Take 1 tablet (10 mg total) by mouth at bedtime. 01/13/19   Baldo Daub, MD  spironolactone (ALDACTONE) 25 MG tablet TAKE ONE TABLET BY MOUTH EVERY DAY 02/01/19   Baldo Daub, MD     Allergies:   No Known Allergies  Social History:   Social History   Socioeconomic History   Marital status: Single    Spouse name: Not on file   Number of children: Not on file   Years of education: Not on file   Highest education level: Not on file  Occupational History   Not on file  Tobacco Use    Smoking status: Former    Packs/day: 1.00    Years: 20.00    Pack years: 20.00    Types: Cigarettes    Quit date: 12/01/1998    Years since quitting: 22.6   Smokeless tobacco: Never  Vaping Use   Vaping Use: Never used  Substance and Sexual Activity   Alcohol use: No   Drug use: No   Sexual activity: Not on file  Other Topics Concern   Not on file  Social History Narrative   Not on file   Social Determinants of Health   Financial Resource Strain: Not on file  Food Insecurity: Not on file  Transportation Needs: Not on file  Physical Activity: Not on file  Stress: Not on file  Social Connections: Not on file  Intimate Partner  Violence: Not on file    Family History:   The patient's family history includes Heart attack in his father; Hypertension in his mother; Lung disease in his brother; Stroke in his mother.    ROS:  Please see the history of present illness.  All other ROS reviewed and negative.     Physical Exam/Data:   Vitals:   07/03/21 1404 07/03/21 1410  SpO2: 100%   Weight:  71 kg  Height:  6' (1.829 m)   No intake or output data in the 24 hours ending 07/03/21 1445 Last 3 Weights 07/03/2021 11/18/2018 08/31/2018  Weight (lbs) 156 lb 8.4 oz 160 lb 8 oz 160 lb  Weight (kg) 71 kg 72.802 kg 72.576 kg     Body mass index is 21.23 kg/m.  General:  Ill appearing older male, diaphoretic  HEENT: normal Neck: + JVD Vascular: No carotid bruits; FA pulses 2+ bilaterally without bruits  Cardiac:  normal S1, S2; RRR; no murmur  Lungs: diminished with crackles  Abd: soft, nontender, no hepatomegaly  Ext: no edema Musculoskeletal:  No deformities, BUE and BLE strength normal and equal Skin: warm and dry  Neuro:  CNs 2-12 intact, no focal abnormalities noted Psych:  Normal affect   EKG:  The ECG that was done 07/03/21 was personally reviewed and demonstrates sinus rhythm, 68 bpm, known left bundle branch block with acute ST elevation >37mm in inferior  leads  Relevant CV Studies:  Echo: 05/2018  Study Conclusions   - Left ventricle: The cavity size was mildly dilated. Systolic    function was moderately reduced. The estimated ejection fraction    was in the range of 35% to 40%.  - Left atrium: The atrium was mildly dilated.   Impressions:   - 1. The anterior septum was hypokinetic and in particular the    septum appeared dyskinetic. Impaired left ventricular relaxation.    The ejection fraction was visually estimated at 35 to 40%.    2. Mild MR.   Laboratory Data:  High Sensitivity Troponin:  No results for input(s): TROPONINIHS in the last 720 hours.    ChemistryNo results for input(s): NA, K, CL, CO2, GLUCOSE, BUN, CREATININE, CALCIUM, GFRNONAA, GFRAA, ANIONGAP in the last 168 hours.  No results for input(s): PROT, ALBUMIN, AST, ALT, ALKPHOS, BILITOT in the last 168 hours. HematologyNo results for input(s): WBC, RBC, HGB, HCT, MCV, MCH, MCHC, RDW, PLT in the last 168 hours. BNPNo results for input(s): BNP, PROBNP in the last 168 hours.  DDimer No results for input(s): DDIMER in the last 168 hours.   Radiology/Studies:  No results found.   Assessment and Plan:   Lance Ayala is a 65 y.o. male with CAD s/p CABG '19, chronic combined HF, carotid artery disease (R CEA 12/2017), LBBB, HLD, HTN, lung nodule, COPD, TIA who is being seen 07/03/2021 for the evaluation of chest pain/STEMI.  STEMI: Known history of coronary disease with CABG x4 in 2019.  Developed chest pain yesterday which lingered through the evening and worsened this morning.  Presented to [facility] and noted to have acute ST elevation in inferior leads.  Code STEMI was activated.  Given aspirin, heparin, morphine, Zofran and loaded with Brilinta 180 mg.  Emergently to the Cath Lab for further evaluation.  Further recommendations pending cath. -- Anticipate routine post MI care  Chronic combined CHF: Noted 25% with improvement to 35 to 40% on last echo 2019.   Did not appear acutely volume overloaded at the time  of exam. -- PTA meds--> Coreg 6.25 mg twice daily, lisinopril 10 mg, spironolactone 25 mg daily --Given his known cardiomyopathy will need to consider transition to [place], +/- SGLT2 prior to discharge  Hypertension: On Coreg lisinopril and spironolactone -- will add back therapy as tolerated  Hyperlipidemia: On Lipitor 80 mg --Check lipids  COPD: no active wheezing on exam, but noted crackles   Carotid artery disease: Status post right CEA 2019 --On aspirin and statin   Risk Assessment/Risk Scores:  }  TIMI Risk Score for ST  Elevation MI:   The patient's TIMI risk score is 9, which indicates a 35.9% risk of all cause mortality at 30 days. { [id number]  Severity of Illness: The appropriate patient status for this patient is INPATIENT. Inpatient status is judged to be reasonable and necessary in order to provide the required intensity of service to ensure the patient's safety. The patient's presenting symptoms, physical exam findings, and initial radiographic and laboratory data in the context of their chronic comorbidities is felt to place them at high risk for further clinical deterioration. Furthermore, it is not anticipated that the patient will be medically stable for discharge from the hospital within 2 midnights of admission. The following factors support the patient status of inpatient.   " The patient's presenting symptoms include chest pain and shortness of breath. " The worrisome physical exam findings include pale, diaphoretic. " The initial radiographic and laboratory data are worrisome because of EKG with inferior STEMI. " The chronic co-morbidities include CAD s/p CABG, HTN, HLD.    * I certify that at the point of admission it is my clinical judgment that the patient will require inpatient hospital care spanning beyond 2 midnights from the point of admission due to high intensity of service, high risk for further  deterioration and high frequency of surveillance required.*   For questions or updates, please contact [facility] Please consult [email or web address] for contact info under     Signed, Laverda Page, NP  07/03/2021 2:45 PM

## 2021-07-04 ENCOUNTER — Inpatient Hospital Stay (HOSPITAL_COMMUNITY): Payer: Medicare Other

## 2021-07-04 ENCOUNTER — Other Ambulatory Visit (HOSPITAL_COMMUNITY): Payer: Self-pay

## 2021-07-04 DIAGNOSIS — I255 Ischemic cardiomyopathy: Secondary | ICD-10-CM | POA: Diagnosis not present

## 2021-07-04 DIAGNOSIS — I6529 Occlusion and stenosis of unspecified carotid artery: Secondary | ICD-10-CM

## 2021-07-04 DIAGNOSIS — E782 Mixed hyperlipidemia: Secondary | ICD-10-CM

## 2021-07-04 DIAGNOSIS — I10 Essential (primary) hypertension: Secondary | ICD-10-CM | POA: Diagnosis not present

## 2021-07-04 DIAGNOSIS — I2510 Atherosclerotic heart disease of native coronary artery without angina pectoris: Secondary | ICD-10-CM

## 2021-07-04 DIAGNOSIS — Z951 Presence of aortocoronary bypass graft: Secondary | ICD-10-CM | POA: Diagnosis not present

## 2021-07-04 DIAGNOSIS — I2119 ST elevation (STEMI) myocardial infarction involving other coronary artery of inferior wall: Secondary | ICD-10-CM | POA: Diagnosis not present

## 2021-07-04 DIAGNOSIS — I6521 Occlusion and stenosis of right carotid artery: Secondary | ICD-10-CM

## 2021-07-04 LAB — BASIC METABOLIC PANEL
Anion gap: 11 (ref 5–15)
BUN: 21 mg/dL (ref 8–23)
CO2: 22 mmol/L (ref 22–32)
Calcium: 9.2 mg/dL (ref 8.9–10.3)
Chloride: 103 mmol/L (ref 98–111)
Creatinine, Ser: 1.85 mg/dL — ABNORMAL HIGH (ref 0.61–1.24)
GFR, Estimated: 40 mL/min — ABNORMAL LOW (ref >60)
Glucose, Bld: 129 mg/dL — ABNORMAL HIGH (ref 70–99)
Potassium: 4.2 mmol/L (ref 3.5–5.1)
Sodium: 136 mmol/L (ref 135–145)

## 2021-07-04 LAB — CBC
HCT: 42.7 % (ref 39.0–52.0)
Hemoglobin: 14.1 g/dL (ref 13.0–17.0)
MCH: 31.5 pg (ref 26.0–34.0)
MCHC: 33 g/dL (ref 30.0–36.0)
MCV: 95.3 fL (ref 80.0–100.0)
Platelets: 200 10*3/uL (ref 150–400)
RBC: 4.48 MIL/uL (ref 4.22–5.81)
RDW: 13.9 % (ref 11.5–15.5)
WBC: 8.4 10*3/uL (ref 4.0–10.5)
nRBC: 0 % (ref 0.0–0.2)

## 2021-07-04 LAB — ECHOCARDIOGRAM COMPLETE
Area-P 1/2: 2.77 cm2
Calc EF: 23.4 %
Height: 72 in
S' Lateral: 4.9 cm
Single Plane A2C EF: 21.7 %
Single Plane A4C EF: 27.3 %
Weight: 2504.43 oz

## 2021-07-04 LAB — TROPONIN I (HIGH SENSITIVITY)
Troponin I (High Sensitivity): >24000 ng/L (ref <18)
Troponin I (High Sensitivity): >24000 ng/L (ref <18)

## 2021-07-04 MED ORDER — CARVEDILOL 3.125 MG PO TABS
3.1250 mg | ORAL_TABLET | Freq: Two times a day (BID) | ORAL | Status: DC
  Administered 2021-07-05 – 2021-07-06 (×2): 3.125 mg via ORAL
  Filled 2021-07-04 (×3): qty 1

## 2021-07-04 MED ORDER — EMPAGLIFLOZIN 10 MG PO TABS
10.0000 mg | ORAL_TABLET | Freq: Every day | ORAL | Status: DC
  Administered 2021-07-04 – 2021-07-06 (×3): 10 mg via ORAL
  Filled 2021-07-04 (×3): qty 1

## 2021-07-04 MED ORDER — STUDY - SOS-AMI - PLACEBO FOR SELATOGREL 0 MG/0.5 ML SQ INJECTION FOR SCREENING USE ONLY (PI-CHRISTOPHER)
0.5000 mL | INJECTION | Freq: Once | SUBCUTANEOUS | Status: DC
  Filled 2021-07-04: qty 0.5

## 2021-07-04 NOTE — Progress Notes (Signed)
  Echocardiogram 2D Echocardiogram has been performed.  Burnard Hawthorne 07/04/2021, 3:49 PM

## 2021-07-04 NOTE — Research (Deleted)
ID- [id number],  SOS-AMI  SUBJECT INFORMED CONSENT PROCESS   Subject Consented by:   []   Principal Investigator:  , MD [x]   Sub-Investigator: Jodelle Red, MD  Consenting Process:   [x]   The above named investigator explained the study and SIL-ICF, in their entirety,           to the Subject and Any / All third parties deemed necessary by the subject.  [x]   All medical questions, pertaining to the Study, were asked and fully answered.    [x]   Subject voiced understanding of the Informed Consent contents and is        agreeable to proceed with the SOS-AMI study.   Both the Subject and Investigator signed the SIL-ICF on:    Date of Consent: ____04-AUG-2022________   (DD-MMM-YYYY) Time of Consent: ______1316_______  (24 hour clock)  [x]  No study related assessment or testing has been performed prior to the        Subject's ICF signature.   [x]  A signed copy of the SIL-ICF was given to the subject/ legal representative,         as applicable.   Investigator Comments:        **Form based on IDORSIA ID-[id number]  page 3

## 2021-07-04 NOTE — Research (Signed)
ID- [id number],  [place]  Authorized Site Personnel  []   , RN []   Eulogio Ditch, RN  []   Amy , RCIS  [x]   Mercer Pod, RN  SUBJECT NAME: Martin________ MRN: _[id number]___________________   Date of study introduction: __04-AUG-2022____   (DD-MMM-YYYY)    Time of introduction: ____1020_  (24 hour clock)  During the subject's hospital visit , the authorized site personnel discussed with  with the subject the possibility to participate in the SOS-AMI study, and provided  the subject with the approved Subject Information Leaflet (SIL)-Informed Consent form (ICF) in a language that he/she can understand.  The subject took the document home to reflect on his/her participation in this study before signing it.   []   The Subject will return to the Cardiovascular Research office, at a later day, to            complete the consenting process.  OR  [x]   The Subject will be given ample time to reflect on this study but will be completing consent process prior to his/ her discharge from this admission.   **Form based on IDORSIA ID-[id number] slide [email or web address] [id number]

## 2021-07-04 NOTE — Progress Notes (Signed)
Progress Note  Patient Name: Lance Ayala Date of Encounter: 07/04/2021  [facility] Cardiologist: Shirlee More, MD   Subjective   Postop day #1 inferior STEMI.  PCI drug-eluting stent x2 by Dr. Ellyn Hack via the right femoral approach.  Patient is pain-free.  Inpatient Medications    Scheduled Meds:  aspirin EC  81 mg Oral Daily   atorvastatin  80 mg Oral QPM   carvedilol  6.25 mg Oral BID WC   Chlorhexidine Gluconate Cloth  6 each Topical Daily   furosemide  40 mg Oral Daily   losartan  25 mg Oral Daily   sodium chloride flush  3 mL Intravenous Q12H   spironolactone  25 mg Oral Daily   ticagrelor  90 mg Oral BID   Continuous Infusions:  sodium chloride     PRN Meds: sodium chloride, acetaminophen, ondansetron (ZOFRAN) IV, sodium chloride flush   Vital Signs    Vitals:   07/04/21 0500 07/04/21 0600 07/04/21 0700 07/04/21 0805  BP: (!) 93/51 (!) 89/54 (!) 98/55 (!) 151/128  Pulse:    80  Resp: '16 20 19 ' (!) 22  Temp:    98.2 F (36.8 C)  TempSrc:    Oral  SpO2: 97% 93% 95% 95%  Weight:      Height:        Intake/Output Summary (Last 24 hours) at 07/04/2021 0856 Last data filed at 07/04/2021 0330 Gross per 24 hour  Intake 690.89 ml  Output 3125 ml  Net -2434.11 ml   Last 3 Weights 07/03/2021 11/18/2018 08/31/2018  Weight (lbs) 156 lb 8.4 oz 160 lb 8 oz 160 lb  Weight (kg) 71 kg 72.802 kg 72.576 kg      Telemetry    Sinus rhythm- Personally Reviewed  ECG    Normal sinus rhythm at 72, left bundle branch block, inferolateral T wave inversion- Personally Reviewed  Physical Exam   GEN: No acute distress.   Neck: No JVD Cardiac: RRR, no murmurs, rubs, or gallops.  Respiratory: Clear to auscultation bilaterally. GI: Soft, nontender, non-distended  MS: No edema; No deformity. Neuro:  Nonfocal  Psych: Normal affect  Extremities: Groin stable  Labs    High Sensitivity Troponin:   Recent Labs  Lab 07/03/21 1830 07/04/21 0025  TROPONINIHS 19,387*  >24,000*      Chemistry Recent Labs  Lab 07/04/21 0025  NA 136  K 4.2  CL 103  CO2 22  GLUCOSE 129*  BUN 21  CREATININE 1.85*  CALCIUM 9.2  GFRNONAA 40*  ANIONGAP 11     Hematology Recent Labs  Lab 07/04/21 0025  WBC 8.4  RBC 4.48  HGB 14.1  HCT 42.7  MCV 95.3  MCH 31.5  MCHC 33.0  RDW 13.9  PLT 200    BNPNo results for input(s): BNP, PROBNP in the last 168 hours.   DDimer No results for input(s): DDIMER in the last 168 hours.   Radiology    CARDIAC CATHETERIZATION  Result Date: 07/03/2021 Formatting of this result is different from the original.   Ost RCA to Prox RCA Stent is 5% stenosed.   CULPRIT LESION: Prox RCA to Mid RCA lesion is 99% stenosed.   A stent was successfully placed using a SYNERGY XD 2.75X16. Deployed to 3.0 mm   Post intervention, there is a 0% residual stenosis.   LESION #2: Mid RCA lesion is 80% stenosed.   A stent was successfully placed using a SYNERGY XD 2.50X12. Deployed to 2.8 mm  Post intervention, there is a 0% residual stenosis.   -----------------LEFT CORONARY ARTERY -------------------   Mid LM to Ost LAD lesion is 45% stenosed with 95% stenosed side branch in Ost Cx to Prox Cx.   Ost Ramus to Ramus lesion is 99% stenosed.   Ost 1st Mrg to 1st Mrg lesion is 100% stenosed.   Ost LAD to Prox LAD lesion is 50% stenosed. Prox LAD to Mid LAD lesion is 100% stenosed after 1st Diag branch.   Ost 1st Diag to 1st Diag lesion is 55% stenosed.   ----------------------------GRAFTS--------------------------------   Dist Graft to Insertion lesion between Ramus and 1st Mrg  is 100% stenosed.   SVG graft was not visualized due to known occlusion. Origin lesion is 100% stenosed.   Seq SVG- Seq SVG-RI/OM1-OM1/2 graft was visualized by angiography and is normal in caliber. There is no competitive flow; the Sequential limb is occluded.   LIMA graft was visualized by angiography and is normal in caliber.  The graft exhibits no disease.  The flow is not reversed.  There is no competitive flow   -------------HEMODYNAMICS-----------------------   There is severe left ventricular systolic dysfunction.  The left ventricular ejection fraction is 25-35% by visual estimate.   LV end diastolic pressure is severely elevated. SUMMARY Acute Inferior ST Elevation MI due to subtotal 95% occlusion of the mid RCA with 80% distal RCA stenosis, and flush occlusion of SVG-RPDA; 5% ISR in ostial and proximal RCA stent.  TIMI-3 flow pre-PCI Successful 2 site PCI of the RCA:  TIMI-3 flow post PCI Prox-Mid RCA 95% reduced to 0% (Synergy DES 2.75 mm x  58m deployed to 3.0 mm); Mid-Distal RCA 80% reduced to 0% (Synergy DES 2.5 mm x 12 mm deployed to 2.8 mm) . Severe LCA CAD with ostial LCx 95% stenosis feeding only a small caliber AV groove LCx; flush occluded high OM1/RI stent, CTO of 2ndOM; proximal LAD long 40% stenosis feeding major Diag 1l branch, the LAD is occluded after Diag branch. 2 out of 4 original grafts Patent: LIMA-LAD, limb 1 ofSVG-OM1/RI with occluded Sequential limb; flush Occluded SVG-rPDA. ACUTE ON CHRONIC COMBINED SYSTOLIC AND DIASTOLIC HEART FAILURE: -- Pre-PCI and diuresis-severely elevated LVEDP of 34 million mercury reduced to 14 million mercury after diuresis -- Severe reduced LVEF of roughly 25% with dense anterior and also now inferior hypokinesis.    Cardiac Studies   Cardiac catheterization, PCI drug-eluting stent (07/03/2021)  Conclusion      Ost RCA to Prox RCA Stent is 5% stenosed.   CULPRIT LESION: Prox RCA to Mid RCA lesion is 99% stenosed.   A stent was successfully placed using a SYNERGY XD 2.75X16. Deployed to 3.0 mm   Post intervention, there is a 0% residual stenosis.   LESION #2: Mid RCA lesion is 80% stenosed.   A stent was successfully placed using a SYNERGY XD 2.50X12. Deployed to 2.8 mm   Post intervention, there is a 0% residual stenosis.   -----------------LEFT CORONARY ARTERY -------------------   Mid LM to Ost LAD lesion is 45%  stenosed with 95% stenosed side branch in Ost Cx to Prox Cx.   Ost Ramus to Ramus lesion is 99% stenosed.   Ost 1st Mrg to 1st Mrg lesion is 100% stenosed.   Ost LAD to Prox LAD lesion is 50% stenosed. Prox LAD to Mid LAD lesion is 100% stenosed after 1st Diag branch.   Ost 1st Diag to 1st Diag lesion is 55% stenosed.   ----------------------------GRAFTS--------------------------------   Dist Graft  to Insertion lesion between Ramus and 1st Mrg  is 100% stenosed.   SVG graft was not visualized due to known occlusion. Origin lesion is 100% stenosed.   Seq SVG- Seq SVG-RI/OM1-OM1/2 graft was visualized by angiography and is normal in caliber. There is no competitive flow; the Sequential limb is occluded.   LIMA graft was visualized by angiography and is normal in caliber.  The graft exhibits no disease.  The flow is not reversed. There is no competitive flow   -------------HEMODYNAMICS-----------------------   There is severe left ventricular systolic dysfunction.  The left ventricular ejection fraction is 25-35% by visual estimate.   LV end diastolic pressure is severely elevated.   SUMMARY Acute Inferior ST Elevation MI due to subtotal 95% occlusion of the mid RCA with 80% distal RCA stenosis, and flush occlusion of SVG-RPDA; 5% ISR in ostial and proximal RCA stent.  TIMI-3 flow pre-PCI Successful 2 site PCI of the RCA:  TIMI-3 flow post PCI Prox-Mid RCA 95% reduced to 0% (Synergy DES 2.75 mm x  10m deployed to 3.0 mm); Mid-Distal RCA 80% reduced to 0% (Synergy DES 2.5 mm x 12 mm deployed to 2.8 mm) . Severe LCA CAD with ostial LCx 95% stenosis feeding only a small caliber AV groove LCx; flush occluded high OM1/RI stent, CTO of 2ndOM; proximal LAD long 40% stenosis feeding major Diag 1l branch, the LAD is occluded after Diag branch. 2 out of 4 original grafts Patent: LIMA-LAD, limb 1 ofSVG-OM1/RI with occluded Sequential limb; flush Occluded SVG-rPDA. ACUTE ON CHRONIC COMBINED SYSTOLIC AND  DIASTOLIC HEART FAILURE: -- Pre-PCI and diuresis-severely elevated LVEDP of 34 million mercury reduced to 14 million mercury after diuresis -- Severe reduced LVEF of roughly 25% with dense anterior and also now inferior hypokinesis.    [facility] Order# [id number] physician: HLeonie Man MD Ordering physician: HLeonie Man MD Study date: 07/03/21    MyChart Results Release  MyChart Status: Pending  Results Release    Physicians  Panel Physicians Referring Physician Case Authorizing Physician  HLeonie Man MD (Primary)     Procedures  CORONARY STENT INTERVENTION  LEFT HEART CATH AND CORONARY ANGIOGRAPHY   Conclusion      Ost RCA to Prox RCA Stent is 5% stenosed.   CULPRIT LESION: Prox RCA to Mid RCA lesion is 99% stenosed.   A stent was successfully placed using a SYNERGY XD 2.75X16. Deployed to 3.0 mm   Post intervention, there is a 0% residual stenosis.   LESION #2: Mid RCA lesion is 80% stenosed.   A stent was successfully placed using a SYNERGY XD 2.50X12. Deployed to 2.8 mm   Post intervention, there is a 0% residual stenosis.   -----------------LEFT CORONARY ARTERY -------------------   Mid LM to Ost LAD lesion is 45% stenosed with 95% stenosed side branch in Ost Cx to Prox Cx.   Ost Ramus to Ramus lesion is 99% stenosed.   Ost 1st Mrg to 1st Mrg lesion is 100% stenosed.   Ost LAD to Prox LAD lesion is 50% stenosed. Prox LAD to Mid LAD lesion is 100% stenosed after 1st Diag branch.   Ost 1st Diag to 1st Diag lesion is 55% stenosed.   ----------------------------GRAFTS--------------------------------   Dist Graft to Insertion lesion between Ramus and 1st Mrg  is 100% stenosed.   SVG graft was not visualized due to known occlusion. Origin lesion is 100% stenosed.   Seq SVG- Seq SVG-RI/OM1-OM1/2 graft was visualized by angiography and is  normal in caliber. There is no competitive flow; the Sequential limb is occluded.    LIMA graft was visualized by angiography and is normal in caliber.  The graft exhibits no disease.  The flow is not reversed. There is no competitive flow   -------------HEMODYNAMICS-----------------------   There is severe left ventricular systolic dysfunction.  The left ventricular ejection fraction is 25-35% by visual estimate.   LV end diastolic pressure is severely elevated.   SUMMARY Acute Inferior ST Elevation MI due to subtotal 95% occlusion of the mid RCA with 80% distal RCA stenosis, and flush occlusion of SVG-RPDA; 5% ISR in ostial and proximal RCA stent.  TIMI-3 flow pre-PCI Successful 2 site PCI of the RCA:  TIMI-3 flow post PCI Prox-Mid RCA 95% reduced to 0% (Synergy DES 2.75 mm x  23m deployed to 3.0 mm); Mid-Distal RCA 80% reduced to 0% (Synergy DES 2.5 mm x 12 mm deployed to 2.8 mm) . Severe LCA CAD with ostial LCx 95% stenosis feeding only a small caliber AV groove LCx; flush occluded high OM1/RI stent, CTO of 2ndOM; proximal LAD long 40% stenosis feeding major Diag 1l branch, the LAD is occluded after Diag branch. 2 out of 4 original grafts Patent: LIMA-LAD, limb 1 ofSVG-OM1/RI with occluded Sequential limb; flush Occluded SVG-rPDA. ACUTE ON CHRONIC COMBINED SYSTOLIC AND DIASTOLIC HEART FAILURE: -- Pre-PCI and diuresis-severely elevated LVEDP of 34 million mercury reduced to 14 million mercury after diuresis -- Severe reduced LVEF of roughly 25% with dense anterior and also now inferior hypokinesis.     Recommendations  Antiplatelet/Anticoag Recommend uninterrupted dual antiplatelet therapy with Aspirin 828mdaily and Ticagrelor 9058mwice daily for a minimum of 12 months (ACS-Class I recommendation).   Procedural Details  Technical Details Primary Care Provider:  [facility] Cardiologist: Dr. BriShirlee Morer. MarHarbaugh a 65 48ar old gentleman with history of known ischemic cardiomyopathy secondary to longstanding CAD (previously had PCI to the RCA and  Ramus Intermedius -> he underwent CABG x4 in late 2018 (LIMA-LAD, SVG-RPDA, SeqSVG-RI-OM1 - called OM1-OM2 on report).  He has known cardiomyopathy with ejection fraction of roughly 35%, followed by Dr.Munley.  [facility] (07/[facility]/2021).  Pain went away yesterday, but recurred again today and did not go away leaving him for him to go to the ER.  Upon arrival to ER, EKG was borderline based on his underlying LBBB, however on reevaluation the patient was having worsening pain up to 8-10/10 with evidence of inferior ST elevations on EKG.  Follow-up EKG did show improvement with T wave versions and decreasing of ST elevations, however he still was having pain.  [facility] elevations were subtle and that they were short segment of elevation roughly 2 to 3 mm in the inferior leads. His was given heparin 4000 units +180 mg Brilinta prior to transport.  Upon arrival his EKG had improved somewhat in the ER, but he was still having 8/10 out of 10 pain.  The EKG changes suggested possible recanalization.  Time Out: Verified patient identification, verified procedure, site/side was marked, verified correct patient position, special equipment/implants available, medications/allergies/relevent history reviewed, required imaging and test results available. Performed.  Access:  * RIGHT Common Femoral Artery: 6 Fr Sheath - fluoroscopically guided modified Seldinger Technique   Left Heart Catheterization: 5 and 6 Fr Catheters advanced or exchanged over a J-wire under direct fluoroscopic guidance into the ascending  aorta; JL 4 catheter advanced first.  * LV Hemodynamics (LV Gram): Initially JR4 followed by angled pigtail catheter for LV gram post PCI * Left Coronary Artery Cineangiography: JL 4 catheter * Right Coronary Artery &(SeqSVG-RI-OM1Cineangiography: JR4 catheter  * SVG-RPDA)   Cineangiography: JR4 catheter and AL-1 catheter  -> flush occlusion * LIMA-LAD Cineangiography: JR4 catheter was redirected into Left Subclavian Artery & advance over the wire where it was used to engage the LIMA graft.  Review of initial angiography revealed: Likely culprit lesion being the subtotaled 99% mid RCA lesion followed by 80% stenosis.  There is also the ostial LCx lesion that would likely be treated medically based on the location and inability to treat without jeopardizing the LM-LAD.  Preparations are made for PCI of the RCA in 2 spots. -> Heparin was administered to achieve and maintain an ACT> 250 sec.  He had already been given Brilinta  To site PCI of the RCA performed: See FINDINGS  Upon completion of Angiogaphy, the catheter was removed completely out of the body over a wire, without complication.  Femoral Sheath was removed in the Cath Lab with Mynx closure device used for hemostasis.  (Prior to this a brief femoral angiogram was performed revealing adequate access)    MEDICATIONS SQ Lidocaine 15 mL mL Radial Cocktail: 3 mg Verapmil in 10 mL NS Heparin: Total 8000 units IV Lasix 40 mg x 1 IV metoprolol 5 mg x 1   Estimated blood loss <50 mL.   During this procedure medications were administered to achieve and maintain moderate conscious sedation while the patient's heart rate, blood pressure, and oxygen saturation were continuously monitored and I was present face-to-face 100% of this time.   Medications (Filter: Administrations occurring from 1358 to 1538 on 07/03/21)  important  Continuous medications are totaled by the amount administered until 07/03/21 1538.    lidocaine (PF) (XYLOCAINE) 1 % injection (mL) Total volume:  15 mL  Date/Time Rate/Dose/Volume Action   07/03/21 1415 15 mL Given    fentaNYL (SUBLIMAZE) injection (mcg) Total dose:  25 mcg  Date/Time Rate/Dose/Volume Action   07/03/21 1416 25 mcg Given    midazolam (VERSED) injection  (mg) Total dose:  2 mg  Date/Time Rate/Dose/Volume Action   07/03/21 1416 1 mg Given   1505 1 mg Given    furosemide (LASIX) injection (mg) Total dose:  40 mg  Date/Time Rate/Dose/Volume Action   07/03/21 1439 40 mg Given    heparin sodium (porcine) injection (Units) Total dose:  8,000 Units  Date/Time Rate/Dose/Volume Action   07/03/21 1438 6,000 Units Given   1450 2,000 Units Given    0.9 %  sodium chloride infusion (mL/hr) Total dose:  Cannot be calculated* Dosing weight:  71  *Continuous medication not stopped within the calculation time range. Date/Time Rate/Dose/Volume Action   07/03/21 1410 10 mL/hr New Bag/Given    metoprolol tartrate (LOPRESSOR) injection (mg) Total dose:  5 mg  Date/Time Rate/Dose/Volume Action   07/03/21 1514 5 mg Given    iohexol (OMNIPAQUE) 350 MG/ML injection (mL) Total volume:  190 mL  Date/Time Rate/Dose/Volume Action   07/03/21 1521 190 mL Given    Heparin (Porcine) in NaCl 1000-0.9 UT/500ML-% SOLN (mL) Total volume:  1,500 mL  Date/Time Rate/Dose/Volume Action   07/03/21 1522 500 mL Given   1522 500 mL Given   1522 500 mL Given    Sedation Time  Sedation Time Physician-1: 1 hour 43 seconds   Contrast  Medication Name Total Dose  iohexol (OMNIPAQUE) 350 MG/ML injection 190 mL    Radiation/Fluoro  Fluoro time: 14.8 (min) DAP: 22.5 (Gycm2) Cumulative Air Kerma: 245 (mGy)   Complications   Complications documented before study signed (07/03/2021  4:12 PM)       Log Level Complications  None Documented by Leonie Man, MD 07/03/2021  3:31 PM  Date Found: 07/03/2021  Time Range: Intraprocedure     Coronary Findings   Diagnostic Dominance: Right  Left Main  Mid LM to Ost LAD lesion is 45% stenosed with 95% stenosed side branch in Ost Cx to Prox Cx. The lesion is located at the bifurcation.  Left Anterior Descending  Ost LAD to Prox LAD lesion is 50% stenosed. The lesion is located proximal to the major  branch and segmental.  Prox LAD to Mid LAD lesion is 100% stenosed. The lesion is chronically occluded.  First Diagonal Branch  Ost 1st Diag to 1st Diag lesion is 55% stenosed. large D1 appears to be good quality target  Ramus Intermedius  Ost Ramus to Ramus lesion is 99% stenosed. The lesion is chronically occluded. The lesion was previously treated . severe proximal edge stenosis at the ostium of the ramus as it arises from the left main  Left Circumflex  Vessel is small.  First Obtuse Marginal Branch  Ost 1st Mrg to 1st Mrg lesion is 100% stenosed. The lesion is chronically occluded. High OM with severe proximal/ostial stenosis  Second Obtuse Marginal Branch  Vessel is small in size.  Right Coronary Artery  Ost RCA to Prox RCA lesion is 5% stenosed. The lesion was previously treated using a stent (unknown type) over 2 years ago.  Prox RCA to Mid RCA lesion is 99% stenosed. The lesion is eccentric, irregular and ulcerative. The lesion is mildly calcified.  Mid RCA lesion is 80% stenosed. The lesion is eccentric.  Right Posterior Atrioventricular Artery  Vessel is moderate in size.  Saphenous Graft To RPDA  SVG graft was not visualized due to known occlusion.  Origin lesion is 100% stenosed. The lesion is chronically occluded.  Sequential Jump Graft Graft To Ramus, 1st Mrg  Seq SVG- Seq SVG-RI/OM1-OM1/2 graft was visualized by angiography and is normal in caliber. The graft exhibits no disease. The flow is not reversed. There is no competitive flow  Dist Graft to Insertion lesion between Ramus and 1st Mrg is 100% stenosed. The lesion is chronically occluded.  LIMA LIMA Graft To Mid LAD  LIMA graft was visualized by angiography and is normal in caliber. There is no competitive flow   Intervention   Prox RCA to Mid RCA lesion  Stent  Lesion length: 14 mm. CATH VISTA GUIDE 6FR JR4 guide catheter was inserted. Lesion crossed with guidewire using a WIRE ASAHI PROWATER 180CM. Pre-stent  angioplasty was performed using a BALLOON SAPPHIRE 2.0X12. Maximum pressure: 10 atm. Inflation time: 20 sec. A stent was successfully placed using a SYNERGY XD 2.75X16. Maximum pressure: 20 atm. Inflation time: 30 sec. Stent strut is well apposed. Deployed to 3.0 mm Post-stent angioplasty was not performed.  Post-Intervention Lesion Assessment  The intervention was successful. Pre-interventional TIMI flow is 3. Post-intervention TIMI flow is 3. Treated lesion length: 16 mm.  There is a 0% residual stenosis post intervention.  Mid RCA lesion  Stent  Lesion length: 8 mm. CATH VISTA GUIDE 6FR JR4 guide catheter was inserted. Lesion crossed with guidewire using a WIRE ASAHI PROWATER 180CM. Pre-stent angioplasty was performed using a BALLOON SAPPHIRE 2.0X12. Maximum pressure: 10  atm. Inflation time: 20 sec. A stent was successfully placed using a SYNERGY XD 2.50X12. Maximum pressure: 20 atm. Inflation time: 30 sec. Stent strut is well apposed. Deployed to 2.8 mm Post-stent angioplasty was not performed.  Post-Intervention Lesion Assessment  The intervention was successful. Pre-interventional TIMI flow is 3. Post-intervention TIMI flow is 3. Treated lesion length: 12 mm. No complications occurred at this lesion.  There is a 0% residual stenosis post intervention.   Wall Motion              Left Heart  Left Ventricle The left ventricle is mildly dilated. There is severe left ventricular systolic dysfunction. LV end diastolic pressure is severely elevated. Notably improved following diuresis and PCI. The left ventricular ejection fraction is 25-35% by visual estimate. There are LV function abnormalities due to segmental dysfunction.  Aortic Valve There is no aortic valve stenosis.   Coronary Diagrams   Diagnostic Dominance: Right    Intervention      Patient Profile     Lance Ayala is a 65 y.o. male with CAD s/p CABG '19, chronic combined HF, carotid artery disease (R CEA 12/2017),  LBBB, HLD, HTN, lung nodule, COPD, TIA who is being seen 07/03/2021 for the evaluation of chest pain/STEMI.  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  He had moderate LV dysfunction.  He developed chest pain yesterday while working in his yard.  He is brought urgently to [facility] where his EKG showed left bundle branch block with inferior ST and T wave changes.  He is brought to the Cath Lab underwent femoral diagnostic cath by Dr. Ellyn Hack revealing an occluded circumflex sequential vein from the ramus to an OM and occluded vein to the PDA.  He underwent PCI and drug-eluting stenting x2 to the mid and distal dominant RCA with excellent result.  His troponins were greater than 24,000.  He is on aspirin and Brilinta and remains pain-free.  He will need to continue dual antiplatelet therapy uninterrupted indefinitely.  2: Ischemic cardiomyopathy-EF by 2D echo 3 years ago was 35 to 40%.  Hand-injection during cath revealed EF somewhere in the 20% range, 2D echo pending.  He is on losartan and carvedilol and spironolactone.  3: Essential hypertension-on Coreg and lisinopril as outpatient, changed to losartan and Coreg.  Blood pressures have been variable.  We will continue to observe in the hospital.  4: Hyperlipidemia-on high-dose statin therapy.  We will order a fasting lipid profile.  5: Carotid artery disease-history of right carotid endarterectomy in 2019.  He i has carotid bruits aterally.  We will recheck carotid Doppler studies.  Postop day 1 anterior STEMI.  Patient stable for transfer to telemetry floor, ambulate with cardiac rehab.  2D echo pending.  Possible discharge in the next 24 to 48 hours.  For questions or updates, please contact [place] Please consult [email or web address] for contact info under        Signed, Quay Burow, MD  07/04/2021, 8:56 AM

## 2021-07-04 NOTE — Progress Notes (Signed)
Assumed care of patient around 1400. Pt found to be hypotensive with w/ systolic BP in the 80's. Paged [facility], Bhagat, to notify. Pt received losartan and coreg around 1000 - doses decreased for afternoon.  Paged [facility], Barrett, about giving 1700 coreg w/ BP 105/51. Advised to repeat BP in 1 hour, if systolic BP <100 hold the dose.  Pt's repeat BP 79/46 & 85/53 at 1830 - medication held and PA, Barrett, notified of continued BP issues. Pt asymptomatic, sitting up in chair, in no distress.

## 2021-07-04 NOTE — Research (Signed)
Lance Ayala Subject                 [id number], Lance Ayala   Subject ID: [id number] Trainer's name: Evern Bio, RN  Trainer's signature:  on file- Delegation of Authority Log Date of visit:     04-Jul-2021  Enrolment:   1.1 Screening Date    04-Jul-2021  1.2 Sex Male []     Male   [x]   1.3    Age at screening  __65________ years  Was Subject randomized:   [x]  Yes  []  No        If No, select reason:   []  Not eligible as per inclusion/exclusion criteria     []  Withdrawal by subject     []   Withdrawal by parent / guardian     []  Adverse event      []  Lost to follow-up     []  Death     []  Other: ___________________    2.  Randomization:   2.1 Randomization Date 04-Jul-2021   2.2 Randomization Number _[id number]____  2.3 Stratification 1     []   Background oral P2Y12 receptor antagonist: None    []   Background oral P2Y12 receptor antagonist: Clopidogrel    []   Background oral P2Y12 receptor antagonist: Prasugrel    [x]   Background oral P2Y12 receptor antagonist: Ticagrelor  3. Screening Failure:  3.1  Screen Failure Date  DD/MM/YYYY  4. Discontinuation   4.1   Subject's discontinuation date  DD/MM/YYYY    Form based on IDORSIA [id number],protocol  Version 4.0  page 56 - 19 February 2020 And SOS-AMI_CRFversion 5.0_27Oct2021 pages 1 and 17.                            Subject ID: Trainer's name: , RN  Trainer's signature:  on file- Delegation of Authority Log Date of visit:     04-Jul-2021  SOS-AMI ELIGIBILITY:  INCLUSION / EXCLUSION CRITERIA  Inclusion Criteria:  1. Signed and dated informed consent  []   No   [x]   Yes  5. >/= 65 years old (or age of majority in local region). []   No  [x]   Yes  3.  Discharged with a confirmed diagnosis of symptomatic         Type 1 AMI within 4 weeks prior to randomization.  []   No  [x]   Yes  4. Presence of either a second prior AMI within 1 year of screening  [x]  No []  Yes         Or at least 2  of the following:       A.  Second  prior AMI more than 1 year before screening  []   No  [x]   Yes     B.  Diabetes Mellitus [x]   No  []   Yes     C.  Chronic Kidney Disease    [x]   No  []   Yes     D.  Multivessel Coronary Artery Disease   []   No  [x]   Yes     E.  Peripheral Artery Disease  [x]   No  []   Yes     F.  Age >/= 65 years  []   No  [x]   yes     G. Absence of coronary revascularization of the qualifying AMI.  [x]   No  []  Yes     H. Active daily smoking at screening [x]   No  []   Yes  5.  Subject having successfully self-administered placebo during screening. []   No  [x]   Yes  6.  Women of childbearing potential who fulfill the following criteria:   N/A      - Negative pregnancy test (Urine or Serum) at randomization    []   No  []   Yes      - Agreement to use an acceptable contraceptive method. []   No  []   Yes  Exclusion Criteria:  1.  Increased risk of serious bleeding including any of the following:       A. History of intracranial bleed.  [x]   No  []   Yes     B. Known uncorrected intracranial vascular abnormality [x]   No  []   Yes     C. Gastrointestinal bleed requiring hospitalization or transfusion            Within 1 year prior to screening  [x]   No  []   Yes     D. Subjects on oral triple antithrombotic therapy [x]   No  []   Yes     E. Known liver impairment significantly affecting hepatic function [x]   No  []   Yes     F. Current dialysis  [x]   No  []   Yes     G. Ischemic stroke or transient ischemic attack within 3 months of screening [x]  No  []  Yes  2. Chronic anemia with hemoglobin <10 g/dL [x]   No  []   Yes  3. Chronic thrombocytopenia with platelet count <100,000 /mm3.      [x]   No  []   Yes  4. Concomitant diseases or conditions that in the opinion of the investigator are           not compatible with study participation.   [x]   No  []  Yes   5. Known hypersensitivity to selatogrel,m any of its excipients, or drugs           of the P2Y12 class.  [x]   No  []   Yes  6.  Previous exposure to an investigational drug within 3 months            prior to randomization     [x]   No  []   Yes   7. Participation in another clinical trial with an investigational product     or device within 3 months prior to randomization    [x]   No   []   Yes  8.  Pregnant, planning to become pregnant, or lactating women  N/A [x]   No  []   Yes  9.  Known concomitant life-threatening disease with a                       life expectancy <12 months         [x]   No  []   Yes   DID SUBJECT MEET ALL THE ELIGIBILITY CRITERIA?  []  NO   [x]  YES  If NO, please list Inclusion/Exclusion criteria number(s) not met________________    Form based on IDORSIA source document  eCRF  Version 5.0 - 26-Sep-2020 pg 16                              ID- ,  SOS-AMI  Subject ID: Trainer's name: Trainer's signature:  on file- Delegation of Authority Log Date of visit:    04-Jul-2021  SOS-AMI Study: Screening  Date of Visit:  04-Jul-2021  Subject Informed Consent: Date of informed consent: 04-Jul-2021  Time of informed consent: _1323___ (24 hour clock) Protocol version 4   _____ Local protocol version (if applicable) __4___  Demographics: Listed in Epic   Baseline Risk Factors:  Second prior AMI within 1 year of screening  [x]   No  []   Yes  Second prior AMI more than 1 year before screening []   No  [x]   Yes  Diabetes mellitus defined by ongoing glucose lowering treatment [x]   No  []  Yes  Chronic kidney disease with estimated glomerular filtration               Rate <60 mL/min/1.73 m2                [x]   No  []   Yes  5.   Multivessel CAD >/= 50% stenosis in at least 2 coronary territories []   No  [x]   Yes  6.   PAD defined as any of the following: Ankle/brachial index < 0.85             Amputation, peripheral bypass, or peripheral angioplasty            of the extremities secondary to ischemia.   [x]   No   []   Yes  7.  Absence of  coronary revascularization of the qualifying AMI          Referred to in inclusion criterion: [x]  No  []   Yes  8. Active daily smoking at screening:  [x]   No  []   Yes      P1. Qualifying AMI Characteristics     1.1  Diagnosis Date          03-Jul-2021      1.2  Diagnosis    [x]  STEMI  []   NSTEMI  []   Other           If other, please specify ___________________________________________      1.3  Killip class  [x]  I []  II []  III  []  IV  []  UNK      1.4  Were revascularization procedures performed?    []   No  [x]   Yes      1.5  Peak cTn value     _>24,000_______     1.6  Type    []  Troponin T  []  Troponin I                         []  High Sensitivity Troponin T    [x]   High Sensitivity Troponin I   []  Unknown    1.7  Units  []  g/L   [x]  ng/L  []  ng/mL  []  ug/L []  Unknown                 Other, please specify ______________    1.8 Upper Range Limit   __18____     1.9  Date of discharge:       06-Jul-2021  2. Medications at discharge    2.1   Organic Nitrates  []   No  [x]   Yes    2.2  Beta-blocker   []   No  [x]   Yes    2.3  ACE inhibitor  [x]   No  []   Yes    2.4  Angiotensin II receptor blocker  []   No  [x]   Yes    2.5  Calcium channel blocker  [x]   No  []   Yes    2.6  Aldosterone receptor blocker  []   No  [x]   Yes    2.7  Statin   []   No  [x]   Yes    2.8  PCSK9 inhibitors [x]   No  []   Yes    Form based on IDORSIA SOS-AMI_CRF_Version 5.0 - 27OCT2021 pgs 2-5,8

## 2021-07-04 NOTE — TOC Benefit Eligibility Note (Signed)
Patient Product/process development scientist completed.    The patient is currently admitted and upon discharge could be taking Jardiance 10 mg.  The current 30 day co-pay is, $0.00.   The patient is currently admitted and upon discharge could be taking Farxiga 10 mg.  The current 30 day co-pay is, $0.00.   The patient is insured through [employer] Medicare Part D     Roland Earl, CPhT Pharmacy Patient Advocate Specialist [facility] Health Antimicrobial Stewardship Team Direct Number: [phone]  Fax: [phone]

## 2021-07-04 NOTE — Progress Notes (Signed)
Carotid artery duplex completed. Refer to "CV Proc" under chart review to view preliminary results.  07/04/2021 3:33 PM Eula Fried., MHA, RVT, RDCS, RDMS

## 2021-07-04 NOTE — Plan of Care (Signed)

## 2021-07-04 NOTE — Progress Notes (Signed)
CARDIAC REHAB PHASE I   PRE:  Rate/Rhythm: 72 SR  BP:  Sitting: 86/53      SaO2: 94 RA  MODE:  Ambulation: 380 ft   POST:  Rate/Rhythm: 80 SR  BP:  Sitting: 86/35    SaO2: 100 RA   Pt agreeable to ambulate. Denies dizziness. Pt ambulated 325ft in hallway standby assist with slow, steady gait. Pt denies CP, SOB, or dizziness throughout session. Pt returned to chair. Educated on importance of ASA, Brilinta, statin, and NTG. Pt given MI book, heart healthy diet, and HF Booklet. Reviewed importance of daily weights, site care, restrictions, and exercise guidelines. Will refer to CRP II [place].  [id number] Lance Bowen, RN BSN 07/04/2021 1:57 PM

## 2021-07-04 NOTE — Research (Signed)
ID- [id number],  SOS-AMI  SUBJECT INFORMED CONSENT PROCESS   Subject Consented by:   []   Principal Investigator:  , MD [x]   Sub-Investigator: Jodelle Red, MD  Consenting Process:   [x]   The above named investigator explained the study and SIL-ICF, in their entirety,           to the Subject and Any / All third parties deemed necessary by the subject.  [x]   All medical questions, pertaining to the Study, were asked and fully answered.    [x]   Subject voiced understanding of the Informed Consent contents and is        agreeable to proceed with the SOS-AMI study.   Both the Subject and Investigator signed the SIL-ICF on:    Date of Consent: 07-04-2021    Time of Consent: __13:23pm___________  [x]  No study related assessment or testing has been performed prior to the        Subject's ICF signature.   [x]  A signed copy of the SIL-ICF was given to the subject/ legal representative,         as applicable.   Investigator Comments:   Patient was seen and the study completely explained to the patient, including the purpose, need for randomization which includes placebo, and the investigational agent and its properties and risks.  The conditions under which to use the agent were explained to the patient, and the injection procedure explained to the patient by both myself and the research coordinator.  The patient verbalized understanding of all of these.  We also reviewed item by item all additional initials required in the consent regarding study procedures, and the plan for follow up.   The patient consented to the study and was briefly examined.  I also discussed case with Dr. Shawnie Pons who agreed with the patient's study enrollment.   . , MD, [facility], [facility] Medical Director, [facility].      **Form based on IDORSIA ID-[id number]_CRF Version5.0-27OCT2021  page 3

## 2021-07-05 ENCOUNTER — Encounter (HOSPITAL_COMMUNITY): Payer: Self-pay | Admitting: Cardiology

## 2021-07-05 ENCOUNTER — Other Ambulatory Visit (HOSPITAL_COMMUNITY): Payer: Self-pay

## 2021-07-05 DIAGNOSIS — I255 Ischemic cardiomyopathy: Secondary | ICD-10-CM | POA: Diagnosis not present

## 2021-07-05 DIAGNOSIS — Z951 Presence of aortocoronary bypass graft: Secondary | ICD-10-CM | POA: Diagnosis not present

## 2021-07-05 DIAGNOSIS — I2119 ST elevation (STEMI) myocardial infarction involving other coronary artery of inferior wall: Secondary | ICD-10-CM | POA: Diagnosis not present

## 2021-07-05 DIAGNOSIS — E782 Mixed hyperlipidemia: Secondary | ICD-10-CM | POA: Diagnosis not present

## 2021-07-05 LAB — HEMOGLOBIN A1C
Hgb A1c MFr Bld: 6.1 % — ABNORMAL HIGH (ref 4.8–5.6)
Mean Plasma Glucose: 128.37 mg/dL

## 2021-07-05 LAB — LIPID PANEL
Cholesterol: 190 mg/dL (ref 0–200)
HDL: 36 mg/dL — ABNORMAL LOW (ref >40)
LDL Cholesterol: 116 mg/dL — ABNORMAL HIGH (ref 0–99)
Total CHOL/HDL Ratio: 5.3 RATIO
Triglycerides: 189 mg/dL — ABNORMAL HIGH (ref <150)
VLDL: 38 mg/dL (ref 0–40)

## 2021-07-05 MED ORDER — EZETIMIBE 10 MG PO TABS
10.0000 mg | ORAL_TABLET | Freq: Every day | ORAL | Status: DC
  Administered 2021-07-05 – 2021-07-06 (×2): 10 mg via ORAL
  Filled 2021-07-05 (×2): qty 1

## 2021-07-05 NOTE — TOC Benefit Eligibility Note (Signed)
Patient Scientific laboratory technician completed.     The patient is currently admitted and upon discharge could be taking Brilinta 90 mg.   The current 30 day co-pay is, $0.00.     The patient is insured through [employer] Medicare Part D        Roland Earl, CPhT Pharmacy Patient Advocate Specialist [facility] Health Antimicrobial Stewardship Team Direct Number: [phone]  Fax: [phone]

## 2021-07-05 NOTE — Progress Notes (Signed)
CARDIAC REHAB PHASE I   PRE:  Rate/Rhythm: 79 SR BBB  BP:  Supine:   Sitting: 95/82  Standing:    SaO2: 96%RA  MODE:  Ambulation: 675 ft   POST:  Rate/Rhythm: 91 SR BBB  ?PACs  BP:  Supine:   Sitting: 101/58, 110/71  Standing:   SaO2: 99%RA 1115-1136 Pt up in chair. Walked 675 ft on RA with steady gait and no CP. Tolerated well. Denied dizziness. Some early beats after walk. ? PACS with BBB. Asymptomatic.  Luetta Nutting, RN BSN  07/05/2021 11:33 AM

## 2021-07-05 NOTE — Progress Notes (Signed)
Progress Note  Patient Name: Zoran Yankee Date of Encounter: 07/05/2021  [facility] Cardiologist: Shirlee More, MD   Subjective   Postop day #2 inferior STEMI.  PCI drug-eluting stent x2 by Dr. Ellyn Hack via the right femoral approach.  Patient is pain-free.  Inpatient Medications    Scheduled Meds:  aspirin EC  81 mg Oral Daily   atorvastatin  80 mg Oral QPM   carvedilol  3.125 mg Oral BID WC   Chlorhexidine Gluconate Cloth  6 each Topical Daily   empagliflozin  10 mg Oral Daily   ezetimibe  10 mg Oral Daily   furosemide  40 mg Oral Daily   losartan  25 mg Oral Daily   sodium chloride flush  3 mL Intravenous Q12H   spironolactone  25 mg Oral Daily   ticagrelor  90 mg Oral BID   Continuous Infusions:  sodium chloride     PRN Meds: sodium chloride, acetaminophen, ondansetron (ZOFRAN) IV, sodium chloride flush   Vital Signs    Vitals:   07/05/21 0730 07/05/21 0745 07/05/21 0751 07/05/21 0800  BP:  (!) 101/52  103/65  Pulse:    92  Resp: '17 12  16  ' Temp:   98 F (36.7 C)   TempSrc:   Oral   SpO2:    98%  Weight:      Height:        Intake/Output Summary (Last 24 hours) at 07/05/2021 0859 Last data filed at 07/05/2021 0400 Gross per 24 hour  Intake --  Output 750 ml  Net -750 ml   Last 3 Weights 07/03/2021 11/18/2018 08/31/2018  Weight (lbs) 156 lb 8.4 oz 160 lb 8 oz 160 lb  Weight (kg) 71 kg 72.802 kg 72.576 kg      Telemetry    Sinus rhythm- Personally Reviewed  ECG    Normal sinus rhythm at 72, left bundle branch block, inferolateral T wave inversion- Personally Reviewed  Physical Exam   GEN: No acute distress.   Neck: No JVD Cardiac: RRR, no murmurs, rubs, or gallops.  Respiratory: Clear to auscultation bilaterally. GI: Soft, nontender, non-distended  MS: No edema; No deformity. Neuro:  Nonfocal  Psych: Normal affect  Extremities: Groin stable  Labs    High Sensitivity Troponin:   Recent Labs  Lab 07/03/21 1830 07/04/21 0025  07/04/21 0739  TROPONINIHS 19,387* >24,000* >24,000*      Chemistry Recent Labs  Lab 07/04/21 0025  NA 136  K 4.2  CL 103  CO2 22  GLUCOSE 129*  BUN 21  CREATININE 1.85*  CALCIUM 9.2  GFRNONAA 40*  ANIONGAP 11     Hematology Recent Labs  Lab 07/04/21 0025  WBC 8.4  RBC 4.48  HGB 14.1  HCT 42.7  MCV 95.3  MCH 31.5  MCHC 33.0  RDW 13.9  PLT 200    BNPNo results for input(s): BNP, PROBNP in the last 168 hours.   DDimer No results for input(s): DDIMER in the last 168 hours.   Radiology    CARDIAC CATHETERIZATION  Result Date: 07/03/2021 Formatting of this result is different from the original.   Ost RCA to Prox RCA Stent is 5% stenosed.   CULPRIT LESION: Prox RCA to Mid RCA lesion is 99% stenosed.   A stent was successfully placed using a SYNERGY XD 2.75X16. Deployed to 3.0 mm   Post intervention, there is a 0% residual stenosis.   LESION #2: Mid RCA lesion is 80% stenosed.   A stent  was successfully placed using a SYNERGY XD 2.50X12. Deployed to 2.8 mm   Post intervention, there is a 0% residual stenosis.   -----------------LEFT CORONARY ARTERY -------------------   Mid LM to Ost LAD lesion is 45% stenosed with 95% stenosed side branch in Ost Cx to Prox Cx.   Ost Ramus to Ramus lesion is 99% stenosed.   Ost 1st Mrg to 1st Mrg lesion is 100% stenosed.   Ost LAD to Prox LAD lesion is 50% stenosed. Prox LAD to Mid LAD lesion is 100% stenosed after 1st Diag branch.   Ost 1st Diag to 1st Diag lesion is 55% stenosed.   ----------------------------GRAFTS--------------------------------   Dist Graft to Insertion lesion between Ramus and 1st Mrg  is 100% stenosed.   SVG graft was not visualized due to known occlusion. Origin lesion is 100% stenosed.   Seq SVG- Seq SVG-RI/OM1-OM1/2 graft was visualized by angiography and is normal in caliber. There is no competitive flow; the Sequential limb is occluded.   LIMA graft was visualized by angiography and is normal in caliber.  The graft  exhibits no disease.  The flow is not reversed. There is no competitive flow   -------------HEMODYNAMICS-----------------------   There is severe left ventricular systolic dysfunction.  The left ventricular ejection fraction is 25-35% by visual estimate.   LV end diastolic pressure is severely elevated. SUMMARY Acute Inferior ST Elevation MI due to subtotal 95% occlusion of the mid RCA with 80% distal RCA stenosis, and flush occlusion of SVG-RPDA; 5% ISR in ostial and proximal RCA stent.  TIMI-3 flow pre-PCI Successful 2 site PCI of the RCA:  TIMI-3 flow post PCI Prox-Mid RCA 95% reduced to 0% (Synergy DES 2.75 mm x  29m deployed to 3.0 mm); Mid-Distal RCA 80% reduced to 0% (Synergy DES 2.5 mm x 12 mm deployed to 2.8 mm) . Severe LCA CAD with ostial LCx 95% stenosis feeding only a small caliber AV groove LCx; flush occluded high OM1/RI stent, CTO of 2ndOM; proximal LAD long 40% stenosis feeding major Diag 1l branch, the LAD is occluded after Diag branch. 2 out of 4 original grafts Patent: LIMA-LAD, limb 1 ofSVG-OM1/RI with occluded Sequential limb; flush Occluded SVG-rPDA. ACUTE ON CHRONIC COMBINED SYSTOLIC AND DIASTOLIC HEART FAILURE: -- Pre-PCI and diuresis-severely elevated LVEDP of 34 million mercury reduced to 14 million mercury after diuresis -- Severe reduced LVEF of roughly 25% with dense anterior and also now inferior hypokinesis.   ECHOCARDIOGRAM COMPLETE  Result Date: 07/04/2021    ECHOCARDIOGRAM REPORT   Patient Name:   BSELDON BARRELLDate of Exam: 07/04/2021 Medical Rec #:  [id number]   Height:       72.0 in Accession #:    [id number]  Weight:       156.5 lb Date of Birth:  612-18-1957   BSA:          1.920 m Patient Age:    65years     BP:           82/40 mmHg Patient Gender: M            HR:           68 bpm. Exam Location:  Inpatient Procedure: 2D Echo, Cardiac Doppler and Color Doppler Indications:    CAD Native Vessel I25.10  History:        Patient has prior history of Echocardiogram  examinations, most  recent 06/24/2018. CAD and Previous Myocardial Infarction, Prior                 CABG, COPD and TIA, Arrythmias:LBBB; Risk Factors:Hypertension,                 Dyslipidemia and Former Smoker.  Sonographer:    Vickie Epley RDCS Referring Phys: [place]  1. Left ventricular ejection fraction, by estimation, is 25 to 30%. The left ventricle has severely decreased function. The left ventricle demonstrates regional wall motion abnormalities with basal to mid inferior akinesis, septal severe hypokinesis with septal-lateral dyssynchrony consistent with LBBB. The left ventricular internal cavity size was mildly dilated. Left ventricular diastolic parameters are consistent with Grade I diastolic dysfunction (impaired relaxation).  2. Right ventricular systolic function is moderately reduced. The right ventricular size is normal. Tricuspid regurgitation signal is inadequate for assessing PA pressure.  3. The aortic valve is tricuspid. Aortic valve regurgitation is not visualized. No aortic stenosis is present.  4. The inferior vena cava is normal in size with greater than 50% respiratory variability, suggesting right atrial pressure of 3 mmHg.  5. The mitral valve is normal in structure. No evidence of mitral valve regurgitation. No evidence of mitral stenosis. FINDINGS  Left Ventricle: Left ventricular ejection fraction, by estimation, is 25 to 30%. The left ventricle has severely decreased function. The left ventricle demonstrates regional wall motion abnormalities. The left ventricular internal cavity size was mildly  dilated. There is no left ventricular hypertrophy. Left ventricular diastolic parameters are consistent with Grade I diastolic dysfunction (impaired relaxation). Right Ventricle: The right ventricular size is normal. No increase in right ventricular wall thickness. Right ventricular systolic function is moderately reduced. Tricuspid regurgitation  signal is inadequate for assessing PA pressure. Left Atrium: Left atrial size was normal in size. Right Atrium: Right atrial size was normal in size. Pericardium: There is no evidence of pericardial effusion. Mitral Valve: The mitral valve is normal in structure. No evidence of mitral valve regurgitation. No evidence of mitral valve stenosis. Tricuspid Valve: The tricuspid valve is normal in structure. Tricuspid valve regurgitation is trivial. Aortic Valve: The aortic valve is tricuspid. Aortic valve regurgitation is not visualized. No aortic stenosis is present. Pulmonic Valve: The pulmonic valve was normal in structure. Pulmonic valve regurgitation is not visualized. Aorta: The aortic root is normal in size and structure. Venous: The inferior vena cava is normal in size with greater than 50% respiratory variability, suggesting right atrial pressure of 3 mmHg. IAS/Shunts: No atrial level shunt detected by color flow Doppler.  LEFT VENTRICLE PLAX 2D LVIDd:         5.60 cm      Diastology LVIDs:         4.90 cm      LV e' medial:    3.57 cm/s LV PW:         0.90 cm      LV E/e' medial:  11.3 LV IVS:        0.90 cm      LV e' lateral:   6.25 cm/s LVOT diam:     2.00 cm      LV E/e' lateral: 6.5 LV SV:         34 LV SV Index:   18 LVOT Area:     3.14 cm  LV Volumes (MOD) LV vol d, MOD A2C: 180.0 ml LV vol d, MOD A4C: 154.0 ml LV vol s, MOD A2C: 141.0 ml LV  vol s, MOD A4C: 112.0 ml LV SV MOD A2C:     39.0 ml LV SV MOD A4C:     154.0 ml LV SV MOD BP:      39.1 ml RIGHT VENTRICLE RV S prime:     8.86 cm/s TAPSE (M-mode): 1.2 cm LEFT ATRIUM             Index       RIGHT ATRIUM           Index LA diam:        3.50 cm 1.82 cm/m  RA Area:     13.20 cm LA Vol (A2C):   31.4 ml 16.36 ml/m RA Volume:   30.40 ml  15.83 ml/m LA Vol (A4C):   28.3 ml 14.74 ml/m LA Biplane Vol: 30.8 ml 16.04 ml/m  AORTIC VALVE LVOT Vmax:   60.10 cm/s LVOT Vmean:  42.400 cm/s LVOT VTI:    0.108 m  AORTA Ao Root diam: 2.80 cm MITRAL VALVE MV Area  (PHT): 2.77 cm    SHUNTS MV Decel Time: 274 msec    Systemic VTI:  0.11 m MV E velocity: 40.40 cm/s  Systemic Diam: 2.00 cm MV A velocity: 74.70 cm/s MV E/A ratio:  0.54 Loralie Champagne MD Electronically signed by Loralie Champagne MD Signature Date/Time: 07/04/2021/6:06:35 PM    Final    VAS US CAROTID  Result Date: 07/04/2021 Carotid Arterial Duplex Study Patient Name:  ALEXANDRA POSADAS  Date of Exam:   07/04/2021 Medical Rec #: [id number]     Accession #:    [id number] Date of Birth: 1956-04-26     Patient Gender: M Patient Age:   77Y Exam Location:  [facility] Procedure:      VAS US CAROTID Referring Phys: Quay Burow --------------------------------------------------------------------------------  Indications:       Right endarterectomy and History of carotid artery stenosis. Risk Factors:      Hypertension, hyperlipidemia, past history of smoking, prior                    MI, coronary artery disease. Comparison Study:  08/31/2018 Carotid artery duplex- Right Carotid: Velocities in                    the right ICA are consistent with a 40-59% stenosis.                    Non-hemodynamically significant plaque <50% noted                    in the CCA. The ECA appears >50% stenosed. Patent RIGHT                    Endarterectomy with mixed echogenicty plaque noted at                    proximal patch narrowing proximal lumen.                     Low velocities noted in RIGHT vertebral; vertebral artery                    not well visualized.                     Left Carotid: Velocities in the left ICA are consistent with  a 60-79%                    stenosis. Non-hemodynamically significant plaque noted in the                    CCA. The ECA appears >50% stenosed.                     Vertebrals: Left vertebral artery demonstrates antegrade                    flow. Very low RIGHT vertebral velocity.                     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 Normal                     flow                    hemodynamics were seen in the left subclavian artery. Performing Technologist: Maudry Mayhew MHA, RDMS, RVT, RDCS  Examination Guidelines: A complete evaluation includes B-mode imaging, spectral Doppler, color Doppler, and power Doppler as needed of all accessible portions of each vessel. Bilateral testing is considered an integral part of a complete examination. Limited examinations for reoccurring indications may be performed as noted.  Right Carotid Findings: +----------+--------+-------+--------+--------------------------------+--------+           PSV cm/sEDV    StenosisPlaque Description              Comments                   cm/s                                                    +----------+--------+-------+--------+--------------------------------+--------+ CCA Prox  110     17             smooth, heterogenous and                                                  calcific                                 +----------+--------+-------+--------+--------------------------------+--------+ CCA Distal121     17                                                      +----------+--------+-------+--------+--------------------------------+--------+ ICA Prox  147     37             smooth and hypoechoic                    +----------+--------+-------+--------+--------------------------------+--------+ ICA Distal85      22                                                      +----------+--------+-------+--------+--------------------------------+--------+  ECA       177                                                             +----------+--------+-------+--------+--------------------------------+--------+ +----------+--------+-------+------------------------------+-------------------+           PSV cm/sEDV cmsDescribe                      Arm Pressure (mmHG)  +----------+--------+-------+------------------------------+-------------------+ [id number]            Stenotic. Focal heterogenous                                               plaque at proximal stenosis.                      +----------+--------+-------+------------------------------+-------------------+ +---------+--------+--+--------+-+-----------------------------+ VertebralPSV cm/s25EDV cm/s8Antegrade and Bi- directional +---------+--------+--+--------+-+-----------------------------+  Left Carotid Findings: +----------+--------+-------+--------+--------------------------------+--------+           PSV cm/sEDV    StenosisPlaque Description              Comments                   cm/s                                                    +----------+--------+-------+--------+--------------------------------+--------+ CCA Prox  75      9                                                       +----------+--------+-------+--------+--------------------------------+--------+ CCA Distal68      13             calcific and irregular                   +----------+--------+-------+--------+--------------------------------+--------+ ICA Prox  250     68             smooth, heterogenous and                                                  calcific                                 +----------+--------+-------+--------+--------------------------------+--------+ ICA Mid   191     40                                                      +----------+--------+-------+--------+--------------------------------+--------+ ICA Distal64      15                                                      +----------+--------+-------+--------+--------------------------------+--------+  ECA       263                    calcific                                 +----------+--------+-------+--------+--------------------------------+--------+  +----------+--------+--------+----------------+-------------------+           PSV cm/sEDV cm/sDescribe        Arm Pressure (mmHG) +----------+--------+--------+----------------+-------------------+ Subclavian150             Multiphasic, WNL                    +----------+--------+--------+----------------+-------------------+ +---------+--------+--+--------+--+---------+ VertebralPSV cm/s92EDV cm/s13Antegrade +---------+--------+--+--------+--+---------+   Summary: Right Carotid: Velocities in the right ICA are consistent with a 40-59%                stenosis. Non-hemodynamically significant plaque <50% noted in                the CCA. Patent right endarterectomy with mixed echogenicity                plaque noted throughout proximal ICA. Left Carotid: Velocities in the left ICA are consistent with a 60-79% stenosis.               The ECA appears >50% stenosed. Vertebrals:  Left vertebral artery demonstrates antegrade flow. Right vertebral              artery demonstrates bidirectional flow, suggestive of proximal              obstruction. Subclavians: Proximal right subclavian artery was stenotic. Normal flow              hemodynamics were seen in the left subclavian artery. *See table(s) above for measurements and observations.     Preliminary     Cardiac Studies   Cardiac catheterization, PCI drug-eluting stent (07/03/2021)  Conclusion      Ost RCA to Prox RCA Stent is 5% stenosed.   CULPRIT LESION: Prox RCA to Mid RCA lesion is 99% stenosed.   A stent was successfully placed using a SYNERGY XD 2.75X16. Deployed to 3.0 mm   Post intervention, there is a 0% residual stenosis.   LESION #2: Mid RCA lesion is 80% stenosed.   A stent was successfully placed using a SYNERGY XD 2.50X12. Deployed to 2.8 mm   Post intervention, there is a 0% residual stenosis.   -----------------LEFT CORONARY ARTERY -------------------   Mid LM to Ost LAD lesion is 45% stenosed with 95% stenosed side  branch in Ost Cx to Prox Cx.   Ost Ramus to Ramus lesion is 99% stenosed.   Ost 1st Mrg to 1st Mrg lesion is 100% stenosed.   Ost LAD to Prox LAD lesion is 50% stenosed. Prox LAD to Mid LAD lesion is 100% stenosed after 1st Diag branch.   Ost 1st Diag to 1st Diag lesion is 55% stenosed.   ----------------------------GRAFTS--------------------------------   Dist Graft to Insertion lesion between Ramus and 1st Mrg  is 100% stenosed.   SVG graft was not visualized due to known occlusion. Origin lesion is 100% stenosed.   Seq SVG- Seq SVG-RI/OM1-OM1/2 graft was visualized by angiography and is normal in caliber. There is no competitive flow; the Sequential limb is occluded.   LIMA graft was visualized by angiography and is normal in caliber.  The graft exhibits no disease.  The flow  is not reversed. There is no competitive flow   -------------HEMODYNAMICS-----------------------   There is severe left ventricular systolic dysfunction.  The left ventricular ejection fraction is 25-35% by visual estimate.   LV end diastolic pressure is severely elevated.   SUMMARY Acute Inferior ST Elevation MI due to subtotal 95% occlusion of the mid RCA with 80% distal RCA stenosis, and flush occlusion of SVG-RPDA; 5% ISR in ostial and proximal RCA stent.  TIMI-3 flow pre-PCI Successful 2 site PCI of the RCA:  TIMI-3 flow post PCI Prox-Mid RCA 95% reduced to 0% (Synergy DES 2.75 mm x  40m deployed to 3.0 mm); Mid-Distal RCA 80% reduced to 0% (Synergy DES 2.5 mm x 12 mm deployed to 2.8 mm) . Severe LCA CAD with ostial LCx 95% stenosis feeding only a small caliber AV groove LCx; flush occluded high OM1/RI stent, CTO of 2ndOM; proximal LAD long 40% stenosis feeding major Diag 1l branch, the LAD is occluded after Diag branch. 2 out of 4 original grafts Patent: LIMA-LAD, limb 1 ofSVG-OM1/RI with occluded Sequential limb; flush Occluded SVG-rPDA. ACUTE ON CHRONIC COMBINED SYSTOLIC AND DIASTOLIC HEART FAILURE: --  Pre-PCI and diuresis-severely elevated LVEDP of 34 million mercury reduced to 14 million mercury after diuresis -- Severe reduced LVEF of roughly 25% with dense anterior and also now inferior hypokinesis.    [facility] Order# [id number] physician: HLeonie Man MD Ordering physician: HLeonie Man MD Study date: 07/03/21    MyChart Results Release  MyChart Status: Pending  Results Release    Physicians  Panel Physicians Referring Physician Case Authorizing Physician  HLeonie Man MD (Primary)     Procedures  CORONARY STENT INTERVENTION  LEFT HEART CATH AND CORONARY ANGIOGRAPHY   Conclusion      Ost RCA to Prox RCA Stent is 5% stenosed.   CULPRIT LESION: Prox RCA to Mid RCA lesion is 99% stenosed.   A stent was successfully placed using a SYNERGY XD 2.75X16. Deployed to 3.0 mm   Post intervention, there is a 0% residual stenosis.   LESION #2: Mid RCA lesion is 80% stenosed.   A stent was successfully placed using a SYNERGY XD 2.50X12. Deployed to 2.8 mm   Post intervention, there is a 0% residual stenosis.   -----------------LEFT CORONARY ARTERY -------------------   Mid LM to Ost LAD lesion is 45% stenosed with 95% stenosed side branch in Ost Cx to Prox Cx.   Ost Ramus to Ramus lesion is 99% stenosed.   Ost 1st Mrg to 1st Mrg lesion is 100% stenosed.   Ost LAD to Prox LAD lesion is 50% stenosed. Prox LAD to Mid LAD lesion is 100% stenosed after 1st Diag branch.   Ost 1st Diag to 1st Diag lesion is 55% stenosed.   ----------------------------GRAFTS--------------------------------   Dist Graft to Insertion lesion between Ramus and 1st Mrg  is 100% stenosed.   SVG graft was not visualized due to known occlusion. Origin lesion is 100% stenosed.   Seq SVG- Seq SVG-RI/OM1-OM1/2 graft was visualized by angiography and is normal in caliber. There is no competitive flow; the Sequential limb is occluded.   LIMA graft was visualized by  angiography and is normal in caliber.  The graft exhibits no disease.  The flow is not reversed. There is no competitive flow   -------------HEMODYNAMICS-----------------------   There is severe left ventricular systolic dysfunction.  The left ventricular ejection fraction is 25-35% by visual estimate.   LV end diastolic pressure is severely elevated.  SUMMARY Acute Inferior ST Elevation MI due to subtotal 95% occlusion of the mid RCA with 80% distal RCA stenosis, and flush occlusion of SVG-RPDA; 5% ISR in ostial and proximal RCA stent.  TIMI-3 flow pre-PCI Successful 2 site PCI of the RCA:  TIMI-3 flow post PCI Prox-Mid RCA 95% reduced to 0% (Synergy DES 2.75 mm x  5m deployed to 3.0 mm); Mid-Distal RCA 80% reduced to 0% (Synergy DES 2.5 mm x 12 mm deployed to 2.8 mm) . Severe LCA CAD with ostial LCx 95% stenosis feeding only a small caliber AV groove LCx; flush occluded high OM1/RI stent, CTO of 2ndOM; proximal LAD long 40% stenosis feeding major Diag 1l branch, the LAD is occluded after Diag branch. 2 out of 4 original grafts Patent: LIMA-LAD, limb 1 ofSVG-OM1/RI with occluded Sequential limb; flush Occluded SVG-rPDA. ACUTE ON CHRONIC COMBINED SYSTOLIC AND DIASTOLIC HEART FAILURE: -- Pre-PCI and diuresis-severely elevated LVEDP of 34 million mercury reduced to 14 million mercury after diuresis -- Severe reduced LVEF of roughly 25% with dense anterior and also now inferior hypokinesis.     Recommendations  Antiplatelet/Anticoag Recommend uninterrupted dual antiplatelet therapy with Aspirin 867mdaily and Ticagrelor 9015mwice daily for a minimum of 12 months (ACS-Class I recommendation).   Procedural Details  Technical Details Primary Care Provider:  [facility] Cardiologist: Dr. BriShirlee Morer. MarHemann a [age]ar old gentleman with history of known ischemic cardiomyopathy secondary to longstanding CAD (previously had PCI to the RCA and Ramus Intermedius -> he  underwent CABG x4 in late 2018 (LIMA-LAD, SVG-RPDA, SeqSVG-RI-OM1 - called OM1-OM2 on report).  He has known cardiomyopathy with ejection fraction of roughly 35%, followed by Dr.Munley.  [facility] (07/[facility]/2021).  Pain went away yesterday, but recurred again today and did not go away leaving him for him to go to the ER.  Upon arrival to ER, EKG was borderline based on his underlying LBBB, however on reevaluation the patient was having worsening pain up to 8-10/10 with evidence of inferior ST elevations on EKG.  Follow-up EKG did show improvement with T wave versions and decreasing of ST elevations, however he still was having pain.  [facility] elevations were subtle and that they were short segment of elevation roughly 2 to 3 mm in the inferior leads. His was given heparin 4000 units +180 mg Brilinta prior to transport.  Upon arrival his EKG had improved somewhat in the ER, but he was still having 8/10 out of 10 pain.  The EKG changes suggested possible recanalization.  Time Out: Verified patient identification, verified procedure, site/side was marked, verified correct patient position, special equipment/implants available, medications/allergies/relevent history reviewed, required imaging and test results available. Performed.  Access:  * RIGHT Common Femoral Artery: 6 Fr Sheath - fluoroscopically guided modified Seldinger Technique   Left Heart Catheterization: 5 and 6 Fr Catheters advanced or exchanged over a J-wire under direct fluoroscopic guidance into the ascending aorta; JL 4 catheter advanced first.  * LV Hemodynamics (LV Gram): Initially JR4 followed by angled pigtail catheter for LV gram post PCI * Left Coronary Artery Cineangiography: JL 4 catheter * Right Coronary Artery &(SeqSVG-RI-OM1Cineangiography: JR4 catheter  * SVG-RPDA)  Cineangiography: JR4 catheter and  AL-1 catheter  -> flush occlusion * LIMA-LAD Cineangiography: JR4 catheter was redirected into Left Subclavian Artery & advance over the wire where it was used to engage the LIMA  graft.  Review of initial angiography revealed: Likely culprit lesion being the subtotaled 99% mid RCA lesion followed by 80% stenosis.  There is also the ostial LCx lesion that would likely be treated medically based on the location and inability to treat without jeopardizing the LM-LAD.  Preparations are made for PCI of the RCA in 2 spots. -> Heparin was administered to achieve and maintain an ACT> 250 sec.  He had already been given Brilinta  To site PCI of the RCA performed: See FINDINGS  Upon completion of Angiogaphy, the catheter was removed completely out of the body over a wire, without complication.  Femoral Sheath was removed in the Cath Lab with Mynx closure device used for hemostasis.  (Prior to this a brief femoral angiogram was performed revealing adequate access)    MEDICATIONS SQ Lidocaine 15 mL mL Radial Cocktail: 3 mg Verapmil in 10 mL NS Heparin: Total 8000 units IV Lasix 40 mg x 1 IV metoprolol 5 mg x 1   Estimated blood loss <50 mL.   During this procedure medications were administered to achieve and maintain moderate conscious sedation while the patient's heart rate, blood pressure, and oxygen saturation were continuously monitored and I was present face-to-face 100% of this time.   Medications (Filter: Administrations occurring from 1358 to 1538 on 07/03/21)  important  Continuous medications are totaled by the amount administered until 07/03/21 1538.    lidocaine (PF) (XYLOCAINE) 1 % injection (mL) Total volume:  15 mL  Date/Time Rate/Dose/Volume Action   07/03/21 1415 15 mL Given    fentaNYL (SUBLIMAZE) injection (mcg) Total dose:  25 mcg  Date/Time Rate/Dose/Volume Action   07/03/21 1416 25 mcg Given    midazolam (VERSED) injection (mg) Total dose:  2 mg  Date/Time  Rate/Dose/Volume Action   07/03/21 1416 1 mg Given   1505 1 mg Given    furosemide (LASIX) injection (mg) Total dose:  40 mg  Date/Time Rate/Dose/Volume Action   07/03/21 1439 40 mg Given    heparin sodium (porcine) injection (Units) Total dose:  8,000 Units  Date/Time Rate/Dose/Volume Action   07/03/21 1438 6,000 Units Given   1450 2,000 Units Given    0.9 %  sodium chloride infusion (mL/hr) Total dose:  Cannot be calculated* Dosing weight:  71  *Continuous medication not stopped within the calculation time range. Date/Time Rate/Dose/Volume Action   07/03/21 1410 10 mL/hr New Bag/Given    metoprolol tartrate (LOPRESSOR) injection (mg) Total dose:  5 mg  Date/Time Rate/Dose/Volume Action   07/03/21 1514 5 mg Given    iohexol (OMNIPAQUE) 350 MG/ML injection (mL) Total volume:  190 mL  Date/Time Rate/Dose/Volume Action   07/03/21 1521 190 mL Given    Heparin (Porcine) in NaCl 1000-0.9 UT/500ML-% SOLN (mL) Total volume:  1,500 mL  Date/Time Rate/Dose/Volume Action   07/03/21 1522 500 mL Given   1522 500 mL Given   1522 500 mL Given    Sedation Time  Sedation Time Physician-1: 1 hour 43 seconds   Contrast  Medication Name Total Dose  iohexol (OMNIPAQUE) 350 MG/ML injection 190 mL    Radiation/Fluoro  Fluoro time: 14.8 (min) DAP: 22.5 (Gycm2) Cumulative Air Kerma: 300 (mGy)   Complications   Complications documented before study signed (07/03/2021  4:12 PM)       Log Level Complications  None Documented by Leonie Man, MD 07/03/2021  3:31 PM  Date Found: 07/03/2021  Time Range: Intraprocedure     Coronary Findings   Diagnostic Dominance:  Right  Left Main  Mid LM to Ost LAD lesion is 45% stenosed with 95% stenosed side branch in Ost Cx to Prox Cx. The lesion is located at the bifurcation.  Left Anterior Descending  Ost LAD to Prox LAD lesion is 50% stenosed. The lesion is located proximal to the major branch and segmental.  Prox LAD to  Mid LAD lesion is 100% stenosed. The lesion is chronically occluded.  First Diagonal Branch  Ost 1st Diag to 1st Diag lesion is 55% stenosed. large D1 appears to be good quality target  Ramus Intermedius  Ost Ramus to Ramus lesion is 99% stenosed. The lesion is chronically occluded. The lesion was previously treated . severe proximal edge stenosis at the ostium of the ramus as it arises from the left main  Left Circumflex  Vessel is small.  First Obtuse Marginal Branch  Ost 1st Mrg to 1st Mrg lesion is 100% stenosed. The lesion is chronically occluded. High OM with severe proximal/ostial stenosis  Second Obtuse Marginal Branch  Vessel is small in size.  Right Coronary Artery  Ost RCA to Prox RCA lesion is 5% stenosed. The lesion was previously treated using a stent (unknown type) over 2 years ago.  Prox RCA to Mid RCA lesion is 99% stenosed. The lesion is eccentric, irregular and ulcerative. The lesion is mildly calcified.  Mid RCA lesion is 80% stenosed. The lesion is eccentric.  Right Posterior Atrioventricular Artery  Vessel is moderate in size.  Saphenous Graft To RPDA  SVG graft was not visualized due to known occlusion.  Origin lesion is 100% stenosed. The lesion is chronically occluded.  Sequential Jump Graft Graft To Ramus, 1st Mrg  Seq SVG- Seq SVG-RI/OM1-OM1/2 graft was visualized by angiography and is normal in caliber. The graft exhibits no disease. The flow is not reversed. There is no competitive flow  Dist Graft to Insertion lesion between Ramus and 1st Mrg is 100% stenosed. The lesion is chronically occluded.  LIMA LIMA Graft To Mid LAD  LIMA graft was visualized by angiography and is normal in caliber. There is no competitive flow   Intervention   Prox RCA to Mid RCA lesion  Stent  Lesion length: 14 mm. CATH VISTA GUIDE 6FR JR4 guide catheter was inserted. Lesion crossed with guidewire using a WIRE ASAHI PROWATER 180CM. Pre-stent angioplasty was performed using a  BALLOON SAPPHIRE 2.0X12. Maximum pressure: 10 atm. Inflation time: 20 sec. A stent was successfully placed using a SYNERGY XD 2.75X16. Maximum pressure: 20 atm. Inflation time: 30 sec. Stent strut is well apposed. Deployed to 3.0 mm Post-stent angioplasty was not performed.  Post-Intervention Lesion Assessment  The intervention was successful. Pre-interventional TIMI flow is 3. Post-intervention TIMI flow is 3. Treated lesion length: 16 mm.  There is a 0% residual stenosis post intervention.  Mid RCA lesion  Stent  Lesion length: 8 mm. CATH VISTA GUIDE 6FR JR4 guide catheter was inserted. Lesion crossed with guidewire using a WIRE ASAHI PROWATER 180CM. Pre-stent angioplasty was performed using a BALLOON SAPPHIRE 2.0X12. Maximum pressure: 10 atm. Inflation time: 20 sec. A stent was successfully placed using a SYNERGY XD 2.50X12. Maximum pressure: 20 atm. Inflation time: 30 sec. Stent strut is well apposed. Deployed to 2.8 mm Post-stent angioplasty was not performed.  Post-Intervention Lesion Assessment  The intervention was successful. Pre-interventional TIMI flow is 3. Post-intervention TIMI flow is 3. Treated lesion length: 12 mm. No complications occurred at this lesion.  There is a 0% residual stenosis post intervention.  Wall Motion              Left Heart  Left Ventricle The left ventricle is mildly dilated. There is severe left ventricular systolic dysfunction. LV end diastolic pressure is severely elevated. Notably improved following diuresis and PCI. The left ventricular ejection fraction is 25-35% by visual estimate. There are LV function abnormalities due to segmental dysfunction.  Aortic Valve There is no aortic valve stenosis.   Coronary Diagrams   Diagnostic Dominance: Right    Intervention      Patient Profile     Abbie Jablon is a 65 y.o. male with CAD s/p CABG '19, chronic combined HF, carotid artery disease (R CEA 12/2017), LBBB, HLD, HTN, lung nodule, COPD,  TIA who is being seen 07/03/2021 for the evaluation of chest pain/STEMI.  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  He had moderate LV dysfunction.  He developed chest pain yesterday while working in his yard.  He is brought urgently to [facility] where his EKG showed left bundle branch block with inferior ST and T wave changes.  He is brought to the Cath Lab underwent femoral diagnostic cath by Dr. Ellyn Hack revealing an occluded circumflex sequential vein from the ramus to an OM and occluded vein to the PDA.  He underwent PCI and drug-eluting stenting x2 to the mid and distal dominant RCA with excellent result.  His troponins were greater than 24,000.  He is on aspirin and Brilinta and remains pain-free.  He will need to continue dual antiplatelet therapy uninterrupted indefinitely.  2: Ischemic cardiomyopathy-EF by 2D echo 3 years ago was 35 to 40%.  Hand-injection during cath revealed EF somewhere in the 20% range, 2D echo pending.  He is on losartan and carvedilol and spironolactone.  2D echo confirmed severe LV dysfunction with an EF in the 25% range.  His carvedilol was down titrated because of hypotension.  3: Essential hypertension-on Coreg and lisinopril as outpatient, changed to losartan and Coreg.  Blood pressures have been variable.  We will continue to observe in the hospital.  4: Hyperlipidemia-on high-dose statin therapy.  His LDL was marginally elevated but he did say that he was taking his statin drug missing several doses a week.  We will enforce daily dosing and add Zetia.  5: Carotid artery disease-history of right carotid endarterectomy in 2019.  He i has carotid bruits aterally.  Carotid Dopplers revealed moderate bilateral ICA stenosis left greater than right.  This will need to be followed on an annual basis.  Postop day 2 inferior STEMI.  Patient stable for  transfer to telemetry floor, ambulate with cardiac rehab.  2D echo confirms severe LV dysfunction with an EF in the 25% range.  Cardiac rehab to ambulate.  Anticipate discharge tomorrow.   For questions or updates, please contact [place] Please consult [email or web address] for contact info under        Signed, Quay Burow, MD  07/05/2021, 8:59 AM

## 2021-07-06 DIAGNOSIS — I2119 ST elevation (STEMI) myocardial infarction involving other coronary artery of inferior wall: Secondary | ICD-10-CM | POA: Diagnosis not present

## 2021-07-06 MED ORDER — TICAGRELOR 90 MG PO TABS: 90.0000 mg | ORAL_TABLET | Freq: Two times a day (BID) | ORAL | 2 refills | Status: DC

## 2021-07-06 MED ORDER — EMPAGLIFLOZIN 10 MG PO TABS: 10.0000 mg | ORAL_TABLET | Freq: Every day | ORAL | 1 refills | Status: DC

## 2021-07-06 MED ORDER — LOSARTAN POTASSIUM 25 MG PO TABS: 25.0000 mg | ORAL_TABLET | Freq: Every day | ORAL | 0 refills | Status: DC

## 2021-07-06 MED ORDER — CARVEDILOL 3.125 MG PO TABS
3.1250 mg | ORAL_TABLET | Freq: Two times a day (BID) | ORAL | 1 refills | Status: DC
Start: 2021-07-06 — End: 2021-07-18

## 2021-07-06 MED ORDER — TICAGRELOR 90 MG PO TABS: 90.0000 mg | ORAL_TABLET | Freq: Two times a day (BID) | ORAL | 0 refills | Status: DC

## 2021-07-06 MED ORDER — EZETIMIBE 10 MG PO TABS: 10.0000 mg | ORAL_TABLET | Freq: Every day | ORAL | 0 refills | Status: DC

## 2021-07-06 MED ORDER — EMPAGLIFLOZIN 10 MG PO TABS: 10.0000 mg | ORAL_TABLET | Freq: Every day | ORAL | 0 refills | Status: DC

## 2021-07-06 MED ORDER — EZETIMIBE 10 MG PO TABS: 10.0000 mg | ORAL_TABLET | Freq: Every day | ORAL | 1 refills | Status: DC

## 2021-07-06 NOTE — Research (Signed)
[id number], [id number]  Subject ID: [id number] Trainer's name: Evern Bio Trainer's signature:  on file- Delegation of Authority Log   SCREENING: PATIENT TRAINING- DEMONSTRATION DEVICE   Was the training delivered?   []  NO  [x]  YES        1.1 Date of training:  __04-AUG-2022  dd/mmm/yyyy     1.2 Start time: _1400________  24 hour clock  1.3 Stop time: _1410________  24 hour clock    Difficulties in using the autoinjector for the DEMONSTRATION DEVICE self-injection:  3. Were there any difficulties in performing the placebo self-injection?  [x]  NO  []  YES  Difficulties with Step1: Choose an injection site?  3.1.1. Was the injection site as defined in the protocol (abdomen/thigh)? []  NO  [x]   YES 3.1.2 Was the injection done on bare skin? []  NO  [x]   YES 3.1.3 Did the subject report any other difficulties in choosing injection site? [x]  NO  []  YES                 If YES, Please specify: ______________________  Difficulties with Step 2: Twist cap off?  3.2.1  Did the subject twist the cap to remove it? []  NO  [x]   YES 3.2.2    Did the subject twist the cap counterclockwise? []   NO  [x]   YES 3.2.3   Did the force/ torque applied sufficient to twist the cap off?  []   NO  [x]   YES 3.2.4 Did the subject report any other difficulties in twisting the cap off? [x]   NO  []  YES                If YES, please specify:___________________  Difficulties with Step 3: Pinch skin and place the autoinjector:  3.3.1  Did the subject pinch skin at injection site? []   NO  [x]   YES 3.3.2 Did the subject place the autoinjector perpendicular to the skin? []   NO  [x]   YES 3.3.3 Did the subject report any other difficulties pinching the skin and placing                  the autoinejctor?  [x]   NO  []   YES  Difficulties with Step 4: Firmly puch down and hold for 3 seconds  3.4.1 Did the subject attempt to inject with the autoinjector in the right position,      i.e needle end down?  []   NO  [x]    YES 3.4.2 Did the subject push down firmly until it clicked? []  NO  [x]   YES 3.4.3 Did the subject hold the autoinjector for about 3 seconds or until the        Viewing window turned orange?  []   NO  [x]  YES 3.4.4 Did the subject report any other difficulties pushing firmly down? [x]  NO  []   YES     If YES, please specify: _________________  Research SA   SOS-AMI_CRF_Version 5.0_27OCT2021  pgs 6,7    Patient Training & Self-injection of [place]  1. Was the training delivered?  []   No  [x]   Yes      1.1  Date of training  04-Jul-2021     [id number]      1.2  Start time       __1425_ (24 hour clock)      1.3  Stop time       __1430_______ (24 hour clock)       2.  Did the placebo self-injection occur?     []   No  [x]   Yes      2.1 Autoinjector ID / Label    _[id number]__________       Difficulties in using the autoinjector for the placebo self-injection    3.  Were there any difficulties in performing the placebo self-injection [x]   No  []   Yes       IF NO, DO NOT ANSWER THE FOLLOWING QUESTIONS  Difficulties with Step 1: Choose an injection site     3.1.1  Was the injection site as defined in the protocol (abdomen / thigh)?  []   No  []   Yes     3.1.2. Was the injection done on bare skin?  []   No  []   Yes     3.1.3  Did the subject report any other difficulties choosing                      injection site ?  []   No  []   Yes, please specify _____________________  Difficulties with Step 2: Twist cap off     3.2.1  Did the subject twist the cap to remove it?  []   No  []   Yes    3.2.2  Did the subject twist the cap counterclockwise? []   No  []   Yes    3.2.3  Did the force/ torque applied sufficient to twist the cap off?   []   No  []   Yes    3.2.4  Did the subject report any other difficulties in twisting                the cap off?  []   No  []   Yes, please specify ________________________________  Difficulties with Step 3: Pinch skin and place the autoinjector    3.3.1  Did the  subject pinch skin at injection site?  []   No  []   Yes    3.3.2  Did the subject place the autoinjector perpendicularly to the skin? []   No  []   Yes    3.3.3  Did the subject report any other difficulties pinching the skin                and placing the autoinjector?    []   No  []   Yes, please specify ____________________  Difficulties with Step 4: Firmly push down and hold for 3 seconds    3.4.1  Did the subject attempt to inject with the autoinjector in the right                position, I.e. needle end down?   []  No  []   Yes    3.4.2  Did the subject push down firmly until it clicked? []   No  []   Yes    3.4.3  Did the subject hold the autoinjector for about 3 seconds                 or until the viewing window turned orange?  []   No  []   Yes    3.4.4  Did the subject report any other difficulties pushing                 firmly down ?  []   No  []   Yes, Please specify __________________________    INITIAL PATIENT TRAINING  Q1, Did someone (e.g. caregiver, family member) attend the training session             together with the patient?    [x]  No  []   Yes        If yes, please note name phone number and address of other person: ___________________________________________________________________  Q2. Was the following information provided to the subject?             [x]   Heart attack symptoms   [x]   How to act (inject and follow-up actions to be taken)  [x]   Use of the demo device, including label instructions and IFU  [x]   How to perform the placebo self-injection   Q3  Did the subject correctly reply to the wrap-up questions?   A.  What are common heart attack symptoms?    []   No  [x]   Yes             B.  What has to be done in case any of those symptoms occurs?  []  No  [x]  Yes       Make note of any misconceptions and clarifications given: ________________________________________________________________________  Q4  Where will the subject keep/store the study autoinjectors?  ___He will carry one and his son will have knowledge of where he stores the second autoinjector in his home            Form based on [employer] SOS-AMI_CRF Version 5.0 - 27OCT2021 PGS 6, 7, 51

## 2021-07-06 NOTE — Progress Notes (Signed)
Coreg dose held related to hypotension. B/P 90/55.  Natale Milch RN

## 2021-07-06 NOTE — Research (Signed)
Patient was discharged on 06-Jul-2021. Prior to his discharge, this nurse reviewed all [facility] autoinjector training. All of patient's questions were asked and answered.He was given my contact information including my cell phone number in case he had additional questions or concerns. He was given 2 autoinjector kits, # [id number] and [id number].

## 2021-07-06 NOTE — Progress Notes (Signed)
CARDIAC REHAB PHASE I   PRE:  Rate/Rhythm: 88 SR BBB  BP:  Supine:   Sitting: 134/58  Standing:    SaO2: 98%RA  MODE:  Ambulation: 750 ft   POST:  Rate/Rhythm: 99 SR few PVCs, PACS  BP:  Supine:   Sitting: 138/54  Standing:    SaO2: 97%RA [id number] Pt walked 750 ft on RA with steady gait and tolerated well. No CP. Hoping to go home.   Luetta Nutting, RN BSN  07/06/2021 7:53 AM

## 2021-07-06 NOTE — Progress Notes (Signed)
Progress Note  Patient Name: Lance Ayala Date of Encounter: 07/06/2021  [facility] Cardiologist: Shirlee More, MD   Subjective   Postop day #2 inferior STEMI.  PCI drug-eluting stent x2 by Dr. Ellyn Hack via the right femoral approach.  Patient is pain-free.  Inpatient Medications    Scheduled Meds:  aspirin EC  81 mg Oral Daily   atorvastatin  80 mg Oral QPM   carvedilol  3.125 mg Oral BID WC   Chlorhexidine Gluconate Cloth  6 each Topical Daily   empagliflozin  10 mg Oral Daily   ezetimibe  10 mg Oral Daily   furosemide  40 mg Oral Daily   losartan  25 mg Oral Daily   sodium chloride flush  3 mL Intravenous Q12H   spironolactone  25 mg Oral Daily   ticagrelor  90 mg Oral BID   Continuous Infusions:  sodium chloride     PRN Meds: sodium chloride, acetaminophen, ondansetron (ZOFRAN) IV, sodium chloride flush   Vital Signs    Vitals:   07/05/21 1810 07/05/21 2221 07/06/21 0115 07/06/21 0514  BP: (!) 90/55 (!) 88/55 104/60 113/61  Pulse: 83 81 86 81  Resp:  '16 16 17  ' Temp: 97.8 F (36.6 C) 97.9 F (36.6 C) 97.8 F (36.6 C) 98 F (36.7 C)  TempSrc:  Oral Oral Oral  SpO2: 96% 95% 96% 95%  Weight:      Height:        Intake/Output Summary (Last 24 hours) at 07/06/2021 0748 Last data filed at 07/05/2021 0900 Gross per 24 hour  Intake 200 ml  Output 200 ml  Net 0 ml   Last 3 Weights 07/03/2021 11/18/2018 08/31/2018  Weight (lbs) 156 lb 8.4 oz 160 lb 8 oz 160 lb  Weight (kg) 71 kg 72.802 kg 72.576 kg      Telemetry    Sinus rhythm- Personally Reviewed  ECG    Normal sinus rhythm at 72, left bundle branch block, inferolateral T wave inversion- Personally Reviewed  Physical Exam   GEN: No acute distress.   Neck: No JVD Cardiac: RRR, no murmurs, Respiratory: Clear to auscultation bilaterally. GI: Soft, nontender, non-distended  MS: No edema; No deformity. Neuro:  Nonfocal  Psych: Normal affect  Extremities: Groin stable  Labs    High Sensitivity  Troponin:   Recent Labs  Lab 07/03/21 1830 07/04/21 0025 07/04/21 0739  TROPONINIHS 19,387* >24,000* >24,000*      Chemistry Recent Labs  Lab 07/04/21 0025  NA 136  K 4.2  CL 103  CO2 22  GLUCOSE 129*  BUN 21  CREATININE 1.85*  CALCIUM 9.2  GFRNONAA 40*  ANIONGAP 11     Hematology Recent Labs  Lab 07/04/21 0025  WBC 8.4  RBC 4.48  HGB 14.1  HCT 42.7  MCV 95.3  MCH 31.5  MCHC 33.0  RDW 13.9  PLT 200    BNPNo results for input(s): BNP, PROBNP in the last 168 hours.   DDimer No results for input(s): DDIMER in the last 168 hours.   Radiology    ECHOCARDIOGRAM COMPLETE  Result Date: 07/04/2021    ECHOCARDIOGRAM REPORT   Patient Name:   Lance Ayala Date of Exam: 07/04/2021 Medical Rec #:  [id number]    Height:       72.0 in Accession #:    [id number]   Weight:       156.5 lb Date of Birth:  [birth year]    BSA:  1.920 m Patient Age:    65 years     BP:           82/40 mmHg Patient Gender: M            HR:           68 bpm. Exam Location:  Inpatient Procedure: 2D Echo, Cardiac Doppler and Color Doppler Indications:    CAD Native Vessel I25.10  History:        Patient has prior history of Echocardiogram examinations, most                 recent 06/24/2018. CAD and Previous Myocardial Infarction, Prior                 CABG, COPD and TIA, Arrythmias:LBBB; Risk Factors:Hypertension,                 Dyslipidemia and Former Smoker.  Sonographer:    Vickie Epley RDCS Referring Phys: [place]  1. Left ventricular ejection fraction, by estimation, is 25 to 30%. The left ventricle has severely decreased function. The left ventricle demonstrates regional wall motion abnormalities with basal to mid inferior akinesis, septal severe hypokinesis with septal-lateral dyssynchrony consistent with LBBB. The left ventricular internal cavity size was mildly dilated. Left ventricular diastolic parameters are consistent with Grade I diastolic dysfunction (impaired  relaxation).  2. Right ventricular systolic function is moderately reduced. The right ventricular size is normal. Tricuspid regurgitation signal is inadequate for assessing PA pressure.  3. The aortic valve is tricuspid. Aortic valve regurgitation is not visualized. No aortic stenosis is present.  4. The inferior vena cava is normal in size with greater than 50% respiratory variability, suggesting right atrial pressure of 3 mmHg.  5. The mitral valve is normal in structure. No evidence of mitral valve regurgitation. No evidence of mitral stenosis. FINDINGS  Left Ventricle: Left ventricular ejection fraction, by estimation, is 25 to 30%. The left ventricle has severely decreased function. The left ventricle demonstrates regional wall motion abnormalities. The left ventricular internal cavity size was mildly  dilated. There is no left ventricular hypertrophy. Left ventricular diastolic parameters are consistent with Grade I diastolic dysfunction (impaired relaxation). Right Ventricle: The right ventricular size is normal. No increase in right ventricular wall thickness. Right ventricular systolic function is moderately reduced. Tricuspid regurgitation signal is inadequate for assessing PA pressure. Left Atrium: Left atrial size was normal in size. Right Atrium: Right atrial size was normal in size. Pericardium: There is no evidence of pericardial effusion. Mitral Valve: The mitral valve is normal in structure. No evidence of mitral valve regurgitation. No evidence of mitral valve stenosis. Tricuspid Valve: The tricuspid valve is normal in structure. Tricuspid valve regurgitation is trivial. Aortic Valve: The aortic valve is tricuspid. Aortic valve regurgitation is not visualized. No aortic stenosis is present. Pulmonic Valve: The pulmonic valve was normal in structure. Pulmonic valve regurgitation is not visualized. Aorta: The aortic root is normal in size and structure. Venous: The inferior vena cava is normal in  size with greater than 50% respiratory variability, suggesting right atrial pressure of 3 mmHg. IAS/Shunts: No atrial level shunt detected by color flow Doppler.  LEFT VENTRICLE PLAX 2D LVIDd:         5.60 cm      Diastology LVIDs:         4.90 cm      LV e' medial:    3.57 cm/s LV PW:  0.90 cm      LV E/e' medial:  11.3 LV IVS:        0.90 cm      LV e' lateral:   6.25 cm/s LVOT diam:     2.00 cm      LV E/e' lateral: 6.5 LV SV:         34 LV SV Index:   18 LVOT Area:     3.14 cm  LV Volumes (MOD) LV vol d, MOD A2C: 180.0 ml LV vol d, MOD A4C: 154.0 ml LV vol s, MOD A2C: 141.0 ml LV vol s, MOD A4C: 112.0 ml LV SV MOD A2C:     39.0 ml LV SV MOD A4C:     154.0 ml LV SV MOD BP:      39.1 ml RIGHT VENTRICLE RV S prime:     8.86 cm/s TAPSE (M-mode): 1.2 cm LEFT ATRIUM             Index       RIGHT ATRIUM           Index LA diam:        3.50 cm 1.82 cm/m  RA Area:     13.20 cm LA Vol (A2C):   31.4 ml 16.36 ml/m RA Volume:   30.40 ml  15.83 ml/m LA Vol (A4C):   28.3 ml 14.74 ml/m LA Biplane Vol: 30.8 ml 16.04 ml/m  AORTIC VALVE LVOT Vmax:   60.10 cm/s LVOT Vmean:  42.400 cm/s LVOT VTI:    0.108 m  AORTA Ao Root diam: 2.80 cm MITRAL VALVE MV Area (PHT): 2.77 cm    SHUNTS MV Decel Time: 274 msec    Systemic VTI:  0.11 m MV E velocity: 40.40 cm/s  Systemic Diam: 2.00 cm MV A velocity: 74.70 cm/s MV E/A ratio:  0.54 Lance Champagne MD Electronically signed by Lance Champagne MD Signature Date/Time: 07/04/2021/6:06:35 PM    Final    VAS US CAROTID  Result Date: 07/05/2021 Carotid Arterial Duplex Study Patient Name:  Lance Ayala  Date of Exam:   07/04/2021 Medical Rec #: [id number]     Accession #:    [id number] Date of Birth: 10-10-1956     Patient Gender: M Patient Age:   17Y Exam Location:  [facility] Procedure:      VAS US CAROTID Referring Phys: Quay Burow --------------------------------------------------------------------------------  Indications:       Right endarterectomy and History of carotid  artery stenosis. Risk Factors:      Hypertension, hyperlipidemia, past history of smoking, prior                    MI, coronary artery disease. Comparison Study:  08/31/2018 Carotid artery duplex- Right Carotid: Velocities in                    the right ICA are consistent with a 40-59% stenosis.                    Non-hemodynamically significant plaque <50% noted                    in the CCA. The ECA appears >50% stenosed. Patent RIGHT                    Endarterectomy with mixed echogenicty plaque noted at  proximal patch narrowing proximal lumen.                     Low velocities noted in RIGHT vertebral; vertebral artery                    not well visualized.                     Left Carotid: Velocities in the left ICA are consistent with                    a 60-79%                    stenosis. Non-hemodynamically significant plaque noted in the                    CCA. The ECA appears >50% stenosed.                     Vertebrals: Left vertebral artery demonstrates antegrade                    flow. Very low RIGHT vertebral velocity.                     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 Normal                    flow                    hemodynamics were seen in the left subclavian artery. Performing Technologist: Maudry Mayhew MHA, RDMS, RVT, RDCS  Examination Guidelines: A complete evaluation includes B-mode imaging, spectral Doppler, color Doppler, and power Doppler as needed of all accessible portions of each vessel. Bilateral testing is considered an integral part of a complete examination. Limited examinations for reoccurring indications may be performed as noted.  Right Carotid Findings: +----------+--------+-------+--------+--------------------------------+--------+           PSV cm/sEDV    StenosisPlaque Description              Comments                   cm/s                                                     +----------+--------+-------+--------+--------------------------------+--------+ CCA Prox  110     17             smooth, heterogenous and                                                  calcific                                 +----------+--------+-------+--------+--------------------------------+--------+ CCA Distal121     17                                                      +----------+--------+-------+--------+--------------------------------+--------+  ICA Prox  147     37             smooth and hypoechoic                    +----------+--------+-------+--------+--------------------------------+--------+ ICA Distal85      22                                                      +----------+--------+-------+--------+--------------------------------+--------+ ECA       177                                                             +----------+--------+-------+--------+--------------------------------+--------+ +----------+--------+-------+------------------------------+-------------------+           PSV cm/sEDV cmsDescribe                      Arm Pressure (mmHG) +----------+--------+-------+------------------------------+-------------------+ [id number]            Stenotic. Focal heterogenous                                               plaque at proximal stenosis.                      +----------+--------+-------+------------------------------+-------------------+ +---------+--------+--+--------+-+-----------------------------+ VertebralPSV cm/s25EDV cm/s8Antegrade and Bi- directional +---------+--------+--+--------+-+-----------------------------+  Left Carotid Findings: +----------+--------+-------+--------+--------------------------------+--------+           PSV cm/sEDV    StenosisPlaque Description              Comments                   cm/s                                                     +----------+--------+-------+--------+--------------------------------+--------+ CCA Prox  75      9                                                       +----------+--------+-------+--------+--------------------------------+--------+ CCA Distal68      13             calcific and irregular                   +----------+--------+-------+--------+--------------------------------+--------+ ICA Prox  250     68             smooth, heterogenous and                                                  calcific                                 +----------+--------+-------+--------+--------------------------------+--------+  ICA Mid   191     40                                                      +----------+--------+-------+--------+--------------------------------+--------+ ICA Distal64      15                                                      +----------+--------+-------+--------+--------------------------------+--------+ ECA       263                    calcific                                 +----------+--------+-------+--------+--------------------------------+--------+ +----------+--------+--------+----------------+-------------------+           PSV cm/sEDV cm/sDescribe        Arm Pressure (mmHG) +----------+--------+--------+----------------+-------------------+ Subclavian150             Multiphasic, WNL                    +----------+--------+--------+----------------+-------------------+ +---------+--------+--+--------+--+---------+ VertebralPSV cm/s92EDV cm/s13Antegrade +---------+--------+--+--------+--+---------+   Summary: Right Carotid: Velocities in the right ICA are consistent with a 40-59%                stenosis. Non-hemodynamically significant plaque <50% noted in                the CCA. Patent right endarterectomy with mixed echogenicity                plaque noted throughout proximal ICA. Left Carotid: Velocities in the left ICA are  consistent with a 60-79% stenosis.               The ECA appears >50% stenosed. Vertebrals:  Left vertebral artery demonstrates antegrade flow. Right vertebral              artery demonstrates bidirectional flow, suggestive of proximal              obstruction. Subclavians: Proximal right subclavian artery was stenotic. Normal flow              hemodynamics were seen in the left subclavian artery. *See table(s) above for measurements and observations.  Electronically signed by Lance Ayala on 07/05/2021 at 5:30:13 PM.    Final     Cardiac Studies   Cardiac catheterization, PCI drug-eluting stent (07/03/2021)  Conclusion      Ost RCA to Prox RCA Stent is 5% stenosed.   CULPRIT LESION: Prox RCA to Mid RCA lesion is 99% stenosed.   A stent was successfully placed using a SYNERGY XD 2.75X16. Deployed to 3.0 mm   Post intervention, there is a 0% residual stenosis.   LESION #2: Mid RCA lesion is 80% stenosed.   A stent was successfully placed using a SYNERGY XD 2.50X12. Deployed to 2.8 mm   Post intervention, there is a 0% residual stenosis.   -----------------LEFT CORONARY ARTERY -------------------   Mid LM to Ost LAD lesion is 45% stenosed with 95% stenosed side branch in Ost Cx to Prox Cx.   Ost Ramus to Ramus  lesion is 99% stenosed.   Ost 1st Mrg to 1st Mrg lesion is 100% stenosed.   Ost LAD to Prox LAD lesion is 50% stenosed. Prox LAD to Mid LAD lesion is 100% stenosed after 1st Diag branch.   Ost 1st Diag to 1st Diag lesion is 55% stenosed.   ----------------------------GRAFTS--------------------------------   Dist Graft to Insertion lesion between Ramus and 1st Mrg  is 100% stenosed.   SVG graft was not visualized due to known occlusion. Origin lesion is 100% stenosed.   Seq SVG- Seq SVG-RI/OM1-OM1/2 graft was visualized by angiography and is normal in caliber. There is no competitive flow; the Sequential limb is occluded.   LIMA graft was visualized by angiography and is normal in caliber.   The graft exhibits no disease.  The flow is not reversed. There is no competitive flow   -------------HEMODYNAMICS-----------------------   There is severe left ventricular systolic dysfunction.  The left ventricular ejection fraction is 25-35% by visual estimate.   LV end diastolic pressure is severely elevated.   SUMMARY Acute Inferior ST Elevation MI due to subtotal 95% occlusion of the mid RCA with 80% distal RCA stenosis, and flush occlusion of SVG-RPDA; 5% ISR in ostial and proximal RCA stent.  TIMI-3 flow pre-PCI Successful 2 site PCI of the RCA:  TIMI-3 flow post PCI Prox-Mid RCA 95% reduced to 0% (Synergy DES 2.75 mm x  54m deployed to 3.0 mm); Mid-Distal RCA 80% reduced to 0% (Synergy DES 2.5 mm x 12 mm deployed to 2.8 mm) . Severe LCA CAD with ostial LCx 95% stenosis feeding only a small caliber AV groove LCx; flush occluded high OM1/RI stent, CTO of 2ndOM; proximal LAD long 40% stenosis feeding major Diag 1l branch, the LAD is occluded after Diag branch. 2 out of 4 original grafts Patent: LIMA-LAD, limb 1 ofSVG-OM1/RI with occluded Sequential limb; flush Occluded SVG-rPDA. ACUTE ON CHRONIC COMBINED SYSTOLIC AND DIASTOLIC HEART FAILURE: -- Pre-PCI and diuresis-severely elevated LVEDP of 34 million mercury reduced to 14 million mercury after diuresis -- Severe reduced LVEF of roughly 25% with dense anterior and also now inferior hypokinesis.    [facility] Order# [id number] physician: HLeonie Man MD Ordering physician: HLeonie Man MD Study date: 07/03/21    MyChart Results Release  MyChart Status: Pending  Results Release    Physicians  Panel Physicians Referring Physician Case Authorizing Physician  HLeonie Man MD (Primary)     Procedures  CORONARY STENT INTERVENTION  LEFT HEART CATH AND CORONARY ANGIOGRAPHY   Conclusion      Ost RCA to Prox RCA Stent is 5% stenosed.   CULPRIT LESION: Prox RCA to Mid RCA lesion  is 99% stenosed.   A stent was successfully placed using a SYNERGY XD 2.75X16. Deployed to 3.0 mm   Post intervention, there is a 0% residual stenosis.   LESION #2: Mid RCA lesion is 80% stenosed.   A stent was successfully placed using a SYNERGY XD 2.50X12. Deployed to 2.8 mm   Post intervention, there is a 0% residual stenosis.   -----------------LEFT CORONARY ARTERY -------------------   Mid LM to Ost LAD lesion is 45% stenosed with 95% stenosed side branch in Ost Cx to Prox Cx.   Ost Ramus to Ramus lesion is 99% stenosed.   Ost 1st Mrg to 1st Mrg lesion is 100% stenosed.   Ost LAD to Prox LAD lesion is 50% stenosed. Prox LAD to Mid LAD lesion is 100% stenosed after 1st Diag branch.  Ost 1st Diag to 1st Diag lesion is 55% stenosed.   ----------------------------GRAFTS--------------------------------   Dist Graft to Insertion lesion between Ramus and 1st Mrg  is 100% stenosed.   SVG graft was not visualized due to known occlusion. Origin lesion is 100% stenosed.   Seq SVG- Seq SVG-RI/OM1-OM1/2 graft was visualized by angiography and is normal in caliber. There is no competitive flow; the Sequential limb is occluded.   LIMA graft was visualized by angiography and is normal in caliber.  The graft exhibits no disease.  The flow is not reversed. There is no competitive flow   -------------HEMODYNAMICS-----------------------   There is severe left ventricular systolic dysfunction.  The left ventricular ejection fraction is 25-35% by visual estimate.   LV end diastolic pressure is severely elevated.   SUMMARY Acute Inferior ST Elevation MI due to subtotal 95% occlusion of the mid RCA with 80% distal RCA stenosis, and flush occlusion of SVG-RPDA; 5% ISR in ostial and proximal RCA stent.  TIMI-3 flow pre-PCI Successful 2 site PCI of the RCA:  TIMI-3 flow post PCI Prox-Mid RCA 95% reduced to 0% (Synergy DES 2.75 mm x  65m deployed to 3.0 mm); Mid-Distal RCA 80% reduced to 0% (Synergy DES 2.5 mm x  12 mm deployed to 2.8 mm) . Severe LCA CAD with ostial LCx 95% stenosis feeding only a small caliber AV groove LCx; flush occluded high OM1/RI stent, CTO of 2ndOM; proximal LAD long 40% stenosis feeding major Diag 1l branch, the LAD is occluded after Diag branch. 2 out of 4 original grafts Patent: LIMA-LAD, limb 1 ofSVG-OM1/RI with occluded Sequential limb; flush Occluded SVG-rPDA. ACUTE ON CHRONIC COMBINED SYSTOLIC AND DIASTOLIC HEART FAILURE: -- Pre-PCI and diuresis-severely elevated LVEDP of 34 million mercury reduced to 14 million mercury after diuresis -- Severe reduced LVEF of roughly 25% with dense anterior and also now inferior hypokinesis.     Recommendations  Antiplatelet/Anticoag Recommend uninterrupted dual antiplatelet therapy with Aspirin 821mdaily and Ticagrelor 9073mwice daily for a minimum of 12 months (ACS-Class I recommendation).   Procedural Details    Coronary Findings   Diagnostic Dominance: Right  Left Main  Mid LM to Ost LAD lesion is 45% stenosed with 95% stenosed side branch in Ost Cx to Prox Cx. The lesion is located at the bifurcation.  Left Anterior Descending  Ost LAD to Prox LAD lesion is 50% stenosed. The lesion is located proximal to the major branch and segmental.  Prox LAD to Mid LAD lesion is 100% stenosed. The lesion is chronically occluded.  First Diagonal Branch  Ost 1st Diag to 1st Diag lesion is 55% stenosed. large D1 appears to be good quality target  Ramus Intermedius  Ost Ramus to Ramus lesion is 99% stenosed. The lesion is chronically occluded. The lesion was previously treated . severe proximal edge stenosis at the ostium of the ramus as it arises from the left main  Left Circumflex  Vessel is small.  First Obtuse Marginal Branch  Ost 1st Mrg to 1st Mrg lesion is 100% stenosed. The lesion is chronically occluded. High OM with severe proximal/ostial stenosis  Second Obtuse Marginal Branch  Vessel is small in size.  Right Coronary  Artery  Ost RCA to Prox RCA lesion is 5% stenosed. The lesion was previously treated using a stent (unknown type) over 2 years ago.  Prox RCA to Mid RCA lesion is 99% stenosed. The lesion is eccentric, irregular and ulcerative. The lesion is mildly calcified.  Mid RCA lesion is 80%  stenosed. The lesion is eccentric.  Right Posterior Atrioventricular Artery  Vessel is moderate in size.  Saphenous Graft To RPDA  SVG graft was not visualized due to known occlusion.  Origin lesion is 100% stenosed. The lesion is chronically occluded.  Sequential Jump Graft Graft To Ramus, 1st Mrg  Seq SVG- Seq SVG-RI/OM1-OM1/2 graft was visualized by angiography and is normal in caliber. The graft exhibits no disease. The flow is not reversed. There is no competitive flow  Dist Graft to Insertion lesion between Ramus and 1st Mrg is 100% stenosed. The lesion is chronically occluded.  LIMA LIMA Graft To Mid LAD  LIMA graft was visualized by angiography and is normal in caliber. There is no competitive flow   Intervention   Prox RCA to Mid RCA lesion  Stent  Lesion length: 14 mm. CATH VISTA GUIDE 6FR JR4 guide catheter was inserted. Lesion crossed with guidewire using a WIRE ASAHI PROWATER 180CM. Pre-stent angioplasty was performed using a BALLOON SAPPHIRE 2.0X12. Maximum pressure: 10 atm. Inflation time: 20 sec. A stent was successfully placed using a SYNERGY XD 2.75X16. Maximum pressure: 20 atm. Inflation time: 30 sec. Stent strut is well apposed. Deployed to 3.0 mm Post-stent angioplasty was not performed.  Post-Intervention Lesion Assessment  The intervention was successful. Pre-interventional TIMI flow is 3. Post-intervention TIMI flow is 3. Treated lesion length: 16 mm.  There is a 0% residual stenosis post intervention.  Mid RCA lesion  Stent  Lesion length: 8 mm. CATH VISTA GUIDE 6FR JR4 guide catheter was inserted. Lesion crossed with guidewire using a WIRE ASAHI PROWATER 180CM. Pre-stent angioplasty was  performed using a BALLOON SAPPHIRE 2.0X12. Maximum pressure: 10 atm. Inflation time: 20 sec. A stent was successfully placed using a SYNERGY XD 2.50X12. Maximum pressure: 20 atm. Inflation time: 30 sec. Stent strut is well apposed. Deployed to 2.8 mm Post-stent angioplasty was not performed.  Post-Intervention Lesion Assessment  The intervention was successful. Pre-interventional TIMI flow is 3. Post-intervention TIMI flow is 3. Treated lesion length: 12 mm. No complications occurred at this lesion.  There is a 0% residual stenosis post intervention.   Wall Motion              Left Heart  Left Ventricle The left ventricle is mildly dilated. There is severe left ventricular systolic dysfunction. LV end diastolic pressure is severely elevated. Notably improved following diuresis and PCI. The left ventricular ejection fraction is 25-35% by visual estimate. There are LV function abnormalities due to segmental dysfunction.  Aortic Valve There is no aortic valve stenosis.   Coronary Diagrams   Diagnostic Dominance: Right    Intervention      Patient Profile     Lance Ayala is a 65 y.o. male with CAD s/p CABG '19, chronic combined HF, carotid artery disease (R CEA 12/2017), LBBB, HLD, HTN, lung nodule, COPD, TIA who is being seen 07/03/2021 for the evaluation of chest pain/STEMI.  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  He had moderate LV dysfunction.  He developed chest pain yesterday while working in his yard.  He is brought urgently to [facility] where his EKG showed left bundle branch block with inferior ST and T wave changes.  Cath Lab underwent femoral diagnostic cath revealing an occluded circumflex sequential vein from the ramus to an OM and occluded vein to the PDA.  He underwent PCI and drug-eluting stenting x2 to the mid and distal dominant RCA with  excellent result.  His troponins were greater than 24,000.  He is on aspirin and Brilinta and remains pain-free.  He will need to continue dual antiplatelet therapy uninterrupted indefinitely.  2: Ischemic cardiomyopathy-EF by 2D echo 3 years ago was 35 to 40%.  Hand-injection during cath revealed EF somewhere in the 20% range, 2D echo pending.  He is on losartan and carvedilol and spironolactone.  2D echo confirmed severe LV dysfunction with an EF in the 25% range.  His carvedilol was down titrated because of hypotension.  3: Essential hypertension-on Coreg and lisinopril as outpatient, changed to losartan and Coreg.  Blood pressures have been variable.  We will continue to observe in the hospital.  4: Hyperlipidemia-on high-dose statin therapy.  His LDL was marginally elevated but he did say that he was taking his statin drug missing several doses a week.  We will enforce daily dosing and add Zetia.  5: Carotid artery disease-history of right carotid endarterectomy in 2019.  He i has carotid bruits aterally.  Carotid Dopplers revealed moderate bilateral ICA stenosis left greater than right.  This will need to be followed on an annual basis.  Plan to d/c today with out pt follow up    For questions or updates, please contact [place] Please consult [email or web address] for contact info under        Signed, Dorris Carnes, MD  07/06/2021, 7:48 AM

## 2021-07-06 NOTE — Plan of Care (Signed)

## 2021-07-06 NOTE — Research (Signed)
[id number],  [id number]   Subject ID: [id number] Trainer's name: Eulogio Ditch, RN  Trainer's signature:  on file- [employer] Log Date of visit: 04-Jul-2021  [facility]   VISIT 1   1. Were body weight and/or height asessed?   []  No   [x]   Yes  2. Date  _04-AUG-2022_______  DD-MMM-YYY  3. Height    __72_____          4.  Units     []   cm    [x]   inches  5. Height (cm calculated)  inches x 2.54 _182.88__  cm  6. Weight   __156.53__          7.  Units     []   kg     [x]   lbs  8. Weight (kg calculated)    weight lbs x 0.4536   ___71___ kg  9. BMI (kg/m2)  __21.23_______   Physical examination  Was the physical examination performed?     []   No  [x]   Yes  2.  Date     __04-AUG-2022  Medical History  1 Any clinically significant past and/or concomitant diseases or past procedures?  []  No  []  Yes     Main disease or procedure        Start date         End date      Ongoing at          Informed consent ?  Left Heart Catheterization 28-Feb-2013 28-Feb-2013 NO  Left Heart Catheterization 29-Oct-2017 29-Oct-2017 NO   CABG x 4 25-Jan-2018 25-Jan-2018 NO  Left heart Catheterization 03-Jul-2021 03-Jul-2021 yes  Coronary Stent Placement 03-Jul-2021 03-Jul-2021 yes  Add more lines as needed.   Most recent LVEF & Procedures Qualifying [facility]I  Most recent LVEF      1.1  Assessment date    _04-AUG-2022       1.2  Left Ventricular Ejection Fraction (%) [x]   < 35   []   35-50  []   >50  2.  Procedures Qualifying [facility]I      2.1  Coronary- angiography   []   No  [x]   Yes      2.2  Percutaneous coronary intervention []   No  [x]   Yes      2.3  Coronary Artery Bypass Grafting  [x]   No  []   Yes      2.4  Stent insertion    []   No  [x]   Yes              2.4.1.   BMS?       [x]  No  []   yes, Number of BMS  ___________              2.4.2.  DES?        []   No  [x]   yes, Number of DES  ___2_______   2.4.3 BVS?      [x]   No  []   yes, Number of BVS   ___________  Vital Signs  Were vital signs collected?  []   No   [x]   Yes  1.1  Date      _04-AUG-2022  1.2  Time     __1400____ 24 hour clock  1.3  Heart Rate (bpm)  ____72_______               1.4  Systolic Blood Pressure (mmHg) ___106_______             1.5  Diastolic Blood Pressure (mmHg)  56________  Pregnancy Test  2. Was a pregnancy test performed?  []   No  []   yes  [x]   N/A     2.1  Test Date   ___________ DD- MMM-YYY   FRAILTY CHARACTERISTICS   Did any of the following occure within the last 12 months?  4.1  Unintentional weight loss greater than or equal to 5 kg(10 lbs)    [x]  No  []  Yes 4.2  Decrease grip strength [x]  No  []  Yes 4.3 Increased fatigue/ lethargy or decrease in endurance   [x]  No  []   Yes 4.4   Decrease in pace when walking a distance of 5 meters (15 feet) [x]   No  []   Yes 4.5 Decline in typical physical activity level  [x]   No  []   Yes  Form based on [employer] eCRF  Version 5.0 - 26-Sep-2020 pg 10-15

## 2021-07-06 NOTE — Discharge Summary (Signed)
Discharge Summary    Patient ID: Lance Ayala MRN: [id number]; DOB: 12-08-1955  Admit date: 07/03/2021 Discharge date: 07/06/2021  PCP:  Anselmo Pickler, MD   [facility] Providers Cardiologist:  Norman Herrlich, MD     Discharge Diagnoses    Principal Problem:   ST elevation myocardial infarction (STEMI) of inferior wall, subsequent episode of care [facility]) Active Problems:   Coronary artery disease involving native coronary artery of native heart with angina pectoris (HCC)   Hypertensive heart disease with heart failure (HCC)   COPD (chronic obstructive pulmonary disease) (HCC)   Chronic combined systolic and diastolic heart failure (HCC)   Dyslipidemia   S/P CABG x 4   Acute ST elevation myocardial infarction (STEMI) of inferior wall [facility])    Diagnostic Studies/Procedures    Cath: 07/03/21   Ost RCA to Prox RCA Stent is 5% stenosed.   CULPRIT LESION: Prox RCA to Mid RCA lesion is 99% stenosed.   A stent was successfully placed using a SYNERGY XD 2.75X16. Deployed to 3.0 mm   Post intervention, there is a 0% residual stenosis.   LESION #2: Mid RCA lesion is 80% stenosed.   A stent was successfully placed using a SYNERGY XD 2.50X12. Deployed to 2.8 mm   Post intervention, there is a 0% residual stenosis.   -----------------LEFT CORONARY ARTERY -------------------   Mid LM to Ost LAD lesion is 45% stenosed with 95% stenosed side branch in Ost Cx to Prox Cx.   Ost Ramus to Ramus lesion is 99% stenosed.   Ost 1st Mrg to 1st Mrg lesion is 100% stenosed.   Ost LAD to Prox LAD lesion is 50% stenosed. Prox LAD to Mid LAD lesion is 100% stenosed after 1st Diag branch.   Ost 1st Diag to 1st Diag lesion is 55% stenosed.   ----------------------------GRAFTS--------------------------------   Dist Graft to Insertion lesion between Ramus and 1st Mrg  is 100% stenosed.   SVG graft was not visualized due to known occlusion. Origin lesion is 100% stenosed.   Seq SVG- Seq  SVG-RI/OM1-OM1/2 graft was visualized by angiography and is normal in caliber. There is no competitive flow; the Sequential limb is occluded.   LIMA graft was visualized by angiography and is normal in caliber.  The graft exhibits no disease.  The flow is not reversed. There is no competitive flow   -------------HEMODYNAMICS-----------------------   There is severe left ventricular systolic dysfunction.  The left ventricular ejection fraction is 25-35% by visual estimate.   LV end diastolic pressure is severely elevated.   SUMMARY Acute Inferior ST Elevation MI due to subtotal 95% occlusion of the mid RCA with 80% distal RCA stenosis, and flush occlusion of SVG-RPDA; 5% ISR in ostial and proximal RCA stent.  TIMI-3 flow pre-PCI Successful 2 site PCI of the RCA:  TIMI-3 flow post PCI Prox-Mid RCA 95% reduced to 0% (Synergy DES 2.75 mm x  16mm deployed to 3.0 mm); Mid-Distal RCA 80% reduced to 0% (Synergy DES 2.5 mm x 12 mm deployed to 2.8 mm) . Severe LCA CAD with ostial LCx 95% stenosis feeding only a small caliber AV groove LCx; flush occluded high OM1/RI stent, CTO of 2ndOM; proximal LAD long 40% stenosis feeding major Diag 1l branch, the LAD is occluded after Diag branch. 2 out of 4 original grafts Patent: LIMA-LAD, limb 1 ofSVG-OM1/RI with occluded Sequential limb; flush Occluded SVG-rPDA. ACUTE ON CHRONIC COMBINED SYSTOLIC AND DIASTOLIC HEART FAILURE: -- Pre-PCI and diuresis-severely elevated LVEDP of 34 million mercury reduced  to 14 million mercury after diuresis -- Severe reduced LVEF of roughly 25% with dense anterior and also now inferior hypokinesis. Diagnostic Dominance: Right    Intervention     Echo: 07/04/21  IMPRESSIONS     1. Left ventricular ejection fraction, by estimation, is 25 to 30%. The  left ventricle has severely decreased function. The left ventricle  demonstrates regional wall motion abnormalities with basal to mid inferior  akinesis, septal severe  hypokinesis  with septal-lateral dyssynchrony consistent with LBBB. The left  ventricular internal cavity size was mildly dilated. Left ventricular  diastolic parameters are consistent with Grade I diastolic dysfunction  (impaired relaxation).   2. Right ventricular systolic function is moderately reduced. The right  ventricular size is normal. Tricuspid regurgitation signal is inadequate  for assessing PA pressure.   3. The aortic valve is tricuspid. Aortic valve regurgitation is not  visualized. No aortic stenosis is present.   4. The inferior vena cava is normal in size with greater than 50%  respiratory variability, suggesting right atrial pressure of 3 mmHg.   5. The mitral valve is normal in structure. No evidence of mitral valve  regurgitation. No evidence of mitral stenosis.   FINDINGS   Left Ventricle: Left ventricular ejection fraction, by estimation, is 25  to 30%. The left ventricle has severely decreased function. The left  ventricle demonstrates regional wall motion abnormalities. The left  ventricular internal cavity size was mildly   dilated. There is no left ventricular hypertrophy. Left ventricular  diastolic parameters are consistent with Grade I diastolic dysfunction  (impaired relaxation).   Right Ventricle: The right ventricular size is normal. No increase in  right ventricular wall thickness. Right ventricular systolic function is  moderately reduced. Tricuspid regurgitation signal is inadequate for  assessing PA pressure.   Left Atrium: Left atrial size was normal in size.   Right Atrium: Right atrial size was normal in size.   Pericardium: There is no evidence of pericardial effusion.   Mitral Valve: The mitral valve is normal in structure. No evidence of  mitral valve regurgitation. No evidence of mitral valve stenosis.   Tricuspid Valve: The tricuspid valve is normal in structure. Tricuspid  valve regurgitation is trivial.   Aortic Valve: The aortic  valve is tricuspid. Aortic valve regurgitation is  not visualized. No aortic stenosis is present.   Pulmonic Valve: The pulmonic valve was normal in structure. Pulmonic valve  regurgitation is not visualized.   Aorta: The aortic root is normal in size and structure.   Venous: The inferior vena cava is normal in size with greater than 50%  respiratory variability, suggesting right atrial pressure of 3 mmHg.   IAS/Shunts: No atrial level shunt detected by color flow Doppler.  _____________   History of Present Illness     Kanai Berrios is a 65 y.o. male with CAD s/p CABG '19, chronic combined HF, carotid artery disease (R CEA 12/2017), LBBB, HLD, HTN, lung nodule, COPD, TIA who presented with STEMI.  Mr. Casebeer is a 65 year old male with past medical history noted above.  He has a remote history of PCI.  Evaluated 10/2017 for fatigue and poor exercise tolerance and found to have an LVEF of 25 to 30%.  Underwent cath and found to have multivessel disease.  He was also found to have significant carotid artery disease and underwent right CEA 12/2017 then after recovering from that was brought back for elective CABG x4 on 01/2018 with LIMA to LAD, SVG to PDA,  sequential SVG to OM1 and OM 2.  He was discharged on carvedilol, lisinopril and spironolactone.  Uncomplicated post hospital course.  Of note had chest CT 10/2017 which showed 2 pulmonary nodules on the right.  It was recommended for correlation with PET/CT consider.  He was last seen in the office on 10/2018 and reported having persistent cough for several weeks.  Notes indicate he was seen at [facility] and had a chest x-ray which showed a fractured sternal wire.  He was seen in follow-up by his PCP in treated with antibiotics.  He was continued on guideline directed medical therapy.   He presented to [facility] on 07/03/21 with chest pain.  He reports developing chest pain the day prior after being outside doing light yard work.   Symptoms improved that evening and then returned the morning of admission.  Had associated shortness of breath but no nausea vomiting or dizziness.  EKG at [facility] showed sinus rhythm, 68 bpm, known left bundle branch block with acute ST elevation >60mm in inferior leads.  He was given 324 of aspirin, morphine, Zofran, 5000 units of heparin and loaded with Brilinta 180 mg.  Code STEMI was activated and patient was transferred to [facility] patient on arrival rated his chest pain 8/10.  He was given sublingual nitro while at [facility] with subsequent hypotension which was corrected with IV fluids.   Labs at [place]: Sodium 139 Potassium 4.3 Cr 1.2 AST 119 WBC 9.7 Hemoglobin 14.4 COVID-negative Troponin I was drawn but pending at the time of arrival  Hospital Course     Inferior STEMI: Underwent cardiac catheterization noted above showing occluded circumflex sequential vein graft from the ramus to OM and occluded vein graft to PDA.  He underwent PCI/DES x2 to the mid/distal dominant RCA.  High-sensitivity troponin peaked at greater than 24,000.  Recommendations for DAPT with aspirin/Brilinta indefinitely.  Evaluated with cardiac rehab.  -- Continue aspirin, Brilinta, statin, beta-blocker, ARB and spironolactone  Ischemic cardiomyopathy: Known EF of 35 to 40%.  Echo this admission with further decline in EF to 25-30%.  Regional wall motion abnormalities with basal to mid inferior akinesis along with severe septal hypokinesis. --His Coreg was reduced from 6.25 mg twice daily to 3.125 mg twice daily for hypotension.  Was able to tolerate the addition of low-dose losartan 25 mg daily. --Started on SGLT2 --Will need to consider transition to [facility] as an outpatient if blood pressures able to tolerate -- will need to reassess EF in 3 months, if remains down then consideration for ICD  Hyperlipidemia: LDL 116 --On atorvastatin 80 mg daily, though does report missing doses prior to  admission --Zetia added --Repeat LFT/FLP in 8 weeks  Hypertension: As above blood pressure soft during admission.  Will be continued on Coreg 3.125 mg twice daily --Transitioned from lisinopril as an outpatient to losartan with plans for [facility] as an outpatient if tolerated  Carotid artery disease: History of right carotid endarterectomy '19.  Carotid Dopplers with mild bilateral ICA stenosis left greater than right. --Follow as outpatient  Patient was seen by Dr. Tenny Craw and deemed stable for discharge home.  Have arranged for follow-up in the [facility] office with Dr. Dulce Sellar 9/2 as first available.  Also sent message to [facility] to arrange for follow-up in clinic.  Will need to [facility] in 1 week if able to be seen in the [facility] otherwise message sent to [facility] to arrange.  Did the patient have an acute coronary  syndrome (MI, NSTEMI, STEMI, etc) this admission?:  Yes                               AHA/ACC Clinical Performance & Quality Measures: Aspirin prescribed? - Yes ADP Receptor Inhibitor (Plavix/Clopidogrel, Brilinta/Ticagrelor or Effient/Prasugrel) prescribed (includes medically managed patients)? - Yes Beta Blocker prescribed? - Yes High Intensity Statin (Lipitor 40-80mg  or Crestor 20-40mg ) prescribed? - Yes EF assessed during THIS hospitalization? - Yes For EF <40%, was ACEI/ARB prescribed? - Yes For EF <40%, Aldosterone Antagonist (Spironolactone or Eplerenone) prescribed? - Yes Cardiac Rehab Phase II ordered (including medically managed patients)? - Yes     _____________  Discharge Vitals Blood pressure 113/61, pulse 90, temperature 98.4 F (36.9 C), temperature source Oral, resp. rate 18, height 6' (1.829 m), weight 71 kg, SpO2 95 %.  Filed Weights   07/03/21 1410  Weight: 71 kg    Labs & Radiologic Studies    CBC Recent Labs    07/04/21 0025  WBC 8.4  HGB 14.1  HCT 42.7  MCV 95.3  PLT 200   Basic Metabolic Panel Recent Labs    60/45/40 0025   NA 136  K 4.2  CL 103  CO2 22  GLUCOSE 129*  BUN 21  CREATININE 1.85*  CALCIUM 9.2   Liver Function Tests No results for input(s): AST, ALT, ALKPHOS, BILITOT, PROT, ALBUMIN in the last 72 hours. No results for input(s): LIPASE, AMYLASE in the last 72 hours. High Sensitivity Troponin:   Recent Labs  Lab 07/03/21 1830 07/04/21 0025 07/04/21 0739  TROPONINIHS 19,387* >24,000* >24,000*    BNP Invalid input(s): POCBNP D-Dimer No results for input(s): DDIMER in the last 72 hours. Hemoglobin A1C Recent Labs    07/05/21 0200  HGBA1C 6.1*   Fasting Lipid Panel Recent Labs    07/05/21 0200  CHOL 190  HDL 36*  LDLCALC 116*  TRIG 189*  CHOLHDL 5.3   Thyroid Function Tests No results for input(s): TSH, T4TOTAL, T3FREE, THYROIDAB in the last 72 hours.  Invalid input(s): FREET3 _____________  CARDIAC CATHETERIZATION  Result Date: 07/03/2021 Formatting of this result is different from the original.   Ost RCA to Prox RCA Stent is 5% stenosed.   CULPRIT LESION: Prox RCA to Mid RCA lesion is 99% stenosed.   A stent was successfully placed using a SYNERGY XD 2.75X16. Deployed to 3.0 mm   Post intervention, there is a 0% residual stenosis.   LESION #2: Mid RCA lesion is 80% stenosed.   A stent was successfully placed using a SYNERGY XD 2.50X12. Deployed to 2.8 mm   Post intervention, there is a 0% residual stenosis.   -----------------LEFT CORONARY ARTERY -------------------   Mid LM to Ost LAD lesion is 45% stenosed with 95% stenosed side branch in Ost Cx to Prox Cx.   Ost Ramus to Ramus lesion is 99% stenosed.   Ost 1st Mrg to 1st Mrg lesion is 100% stenosed.   Ost LAD to Prox LAD lesion is 50% stenosed. Prox LAD to Mid LAD lesion is 100% stenosed after 1st Diag branch.   Ost 1st Diag to 1st Diag lesion is 55% stenosed.   ----------------------------GRAFTS--------------------------------   Dist Graft to Insertion lesion between Ramus and 1st Mrg  is 100% stenosed.   SVG graft was not  visualized due to known occlusion. Origin lesion is 100% stenosed.   Seq SVG- Seq SVG-RI/OM1-OM1/2 graft was visualized by angiography and is normal  in caliber. There is no competitive flow; the Sequential limb is occluded.   LIMA graft was visualized by angiography and is normal in caliber.  The graft exhibits no disease.  The flow is not reversed. There is no competitive flow   -------------HEMODYNAMICS-----------------------   There is severe left ventricular systolic dysfunction.  The left ventricular ejection fraction is 25-35% by visual estimate.   LV end diastolic pressure is severely elevated. SUMMARY Acute Inferior ST Elevation MI due to subtotal 95% occlusion of the mid RCA with 80% distal RCA stenosis, and flush occlusion of SVG-RPDA; 5% ISR in ostial and proximal RCA stent.  TIMI-3 flow pre-PCI Successful 2 site PCI of the RCA:  TIMI-3 flow post PCI Prox-Mid RCA 95% reduced to 0% (Synergy DES 2.75 mm x  71mm deployed to 3.0 mm); Mid-Distal RCA 80% reduced to 0% (Synergy DES 2.5 mm x 12 mm deployed to 2.8 mm) . Severe LCA CAD with ostial LCx 95% stenosis feeding only a small caliber AV groove LCx; flush occluded high OM1/RI stent, CTO of 2ndOM; proximal LAD long 40% stenosis feeding major Diag 1l branch, the LAD is occluded after Diag branch. 2 out of 4 original grafts Patent: LIMA-LAD, limb 1 ofSVG-OM1/RI with occluded Sequential limb; flush Occluded SVG-rPDA. ACUTE ON CHRONIC COMBINED SYSTOLIC AND DIASTOLIC HEART FAILURE: -- Pre-PCI and diuresis-severely elevated LVEDP of 34 million mercury reduced to 14 million mercury after diuresis -- Severe reduced LVEF of roughly 25% with dense anterior and also now inferior hypokinesis.   ECHOCARDIOGRAM COMPLETE  Result Date: 07/04/2021    ECHOCARDIOGRAM REPORT   Patient Name:   Lance Ayala Date of Exam: 07/04/2021 Medical Rec #:  [id number]    Height:       72.0 in Accession #:    [id number]   Weight:       156.5 lb Date of Birth:  October 21, 1956    BSA:           1.920 m Patient Age:    65 years     BP:           82/40 mmHg Patient Gender: M            HR:           68 bpm. Exam Location:  Inpatient Procedure: 2D Echo, Cardiac Doppler and Color Doppler Indications:    CAD Native Vessel I25.10  History:        Patient has prior history of Echocardiogram examinations, most                 recent 06/24/2018. CAD and Previous Myocardial Infarction, Prior                 CABG, COPD and TIA, Arrythmias:LBBB; Risk Factors:Hypertension,                 Dyslipidemia and Former Smoker.  Sonographer:    Renella Cunas RDCS Referring Phys: [phone] JONATHAN J BERRY IMPRESSIONS  1. Left ventricular ejection fraction, by estimation, is 25 to 30%. The left ventricle has severely decreased function. The left ventricle demonstrates regional wall motion abnormalities with basal to mid inferior akinesis, septal severe hypokinesis with septal-lateral dyssynchrony consistent with LBBB. The left ventricular internal cavity size was mildly dilated. Left ventricular diastolic parameters are consistent with Grade I diastolic dysfunction (impaired relaxation).  2. Right ventricular systolic function is moderately reduced. The right ventricular size is normal. Tricuspid regurgitation signal is inadequate for assessing PA pressure.  3. The aortic  valve is tricuspid. Aortic valve regurgitation is not visualized. No aortic stenosis is present.  4. The inferior vena cava is normal in size with greater than 50% respiratory variability, suggesting right atrial pressure of 3 mmHg.  5. The mitral valve is normal in structure. No evidence of mitral valve regurgitation. No evidence of mitral stenosis. FINDINGS  Left Ventricle: Left ventricular ejection fraction, by estimation, is 25 to 30%. The left ventricle has severely decreased function. The left ventricle demonstrates regional wall motion abnormalities. The left ventricular internal cavity size was mildly  dilated. There is no left ventricular hypertrophy. Left  ventricular diastolic parameters are consistent with Grade I diastolic dysfunction (impaired relaxation). Right Ventricle: The right ventricular size is normal. No increase in right ventricular wall thickness. Right ventricular systolic function is moderately reduced. Tricuspid regurgitation signal is inadequate for assessing PA pressure. Left Atrium: Left atrial size was normal in size. Right Atrium: Right atrial size was normal in size. Pericardium: There is no evidence of pericardial effusion. Mitral Valve: The mitral valve is normal in structure. No evidence of mitral valve regurgitation. No evidence of mitral valve stenosis. Tricuspid Valve: The tricuspid valve is normal in structure. Tricuspid valve regurgitation is trivial. Aortic Valve: The aortic valve is tricuspid. Aortic valve regurgitation is not visualized. No aortic stenosis is present. Pulmonic Valve: The pulmonic valve was normal in structure. Pulmonic valve regurgitation is not visualized. Aorta: The aortic root is normal in size and structure. Venous: The inferior vena cava is normal in size with greater than 50% respiratory variability, suggesting right atrial pressure of 3 mmHg. IAS/Shunts: No atrial level shunt detected by color flow Doppler.  LEFT VENTRICLE PLAX 2D LVIDd:         5.60 cm      Diastology LVIDs:         4.90 cm      LV e' medial:    3.57 cm/s LV PW:         0.90 cm      LV E/e' medial:  11.3 LV IVS:        0.90 cm      LV e' lateral:   6.25 cm/s LVOT diam:     2.00 cm      LV E/e' lateral: 6.5 LV SV:         34 LV SV Index:   18 LVOT Area:     3.14 cm  LV Volumes (MOD) LV vol d, MOD A2C: 180.0 ml LV vol d, MOD A4C: 154.0 ml LV vol s, MOD A2C: 141.0 ml LV vol s, MOD A4C: 112.0 ml LV SV MOD A2C:     39.0 ml LV SV MOD A4C:     154.0 ml LV SV MOD BP:      39.1 ml RIGHT VENTRICLE RV S prime:     8.86 cm/s TAPSE (M-mode): 1.2 cm LEFT ATRIUM             Index       RIGHT ATRIUM           Index LA diam:        3.50 cm 1.82 cm/m  RA  Area:     13.20 cm LA Vol (A2C):   31.4 ml 16.36 ml/m RA Volume:   30.40 ml  15.83 ml/m LA Vol (A4C):   28.3 ml 14.74 ml/m LA Biplane Vol: 30.8 ml 16.04 ml/m  AORTIC VALVE LVOT Vmax:   60.10 cm/s LVOT Vmean:  42.400 cm/s LVOT  VTI:    0.108 m  AORTA Ao Root diam: 2.80 cm MITRAL VALVE MV Area (PHT): 2.77 cm    SHUNTS MV Decel Time: 274 msec    Systemic VTI:  0.11 m MV E velocity: 40.40 cm/s  Systemic Diam: 2.00 cm MV A velocity: 74.70 cm/s MV E/A ratio:  0.54 Marca Ancona MD Electronically signed by Marca Ancona MD Signature Date/Time: 07/04/2021/6:06:35 PM    Final    VAS US CAROTID  Result Date: 07/05/2021 Carotid Arterial Duplex Study Patient Name:  YAZAN GATLING  Date of Exam:   07/04/2021 Medical Rec #: [id number]     Accession #:    [id number] Date of Birth: [birth year]     Patient Gender: M Patient Age:   19Y Exam Location:  [facility] Procedure:      VAS US CAROTID Referring Phys: Nanetta Batty --------------------------------------------------------------------------------  Indications:       Right endarterectomy and History of carotid artery stenosis. Risk Factors:      Hypertension, hyperlipidemia, past history of smoking, prior                    MI, coronary artery disease. Comparison Study:  08/31/2018 Carotid artery duplex- Right Carotid: Velocities in                    the right ICA are consistent with a 40-59% stenosis.                    Non-hemodynamically significant plaque <50% noted                    in the CCA. The ECA appears >50% stenosed. Patent RIGHT                    Endarterectomy with mixed echogenicty plaque noted at                    proximal patch narrowing proximal lumen.                     Low velocities noted in RIGHT vertebral; vertebral artery                    not well visualized.                     Left Carotid: Velocities in the left ICA are consistent with                    a 60-79%                    stenosis. Non-hemodynamically significant plaque noted  in the                    CCA. The ECA appears >50% stenosed.                     Vertebrals: Left vertebral artery demonstrates antegrade                    flow. Very low RIGHT vertebral velocity.                     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 Normal                    flow  hemodynamics were seen in the left subclavian artery. Performing Technologist: Gertie Fey MHA, RDMS, RVT, RDCS  Examination Guidelines: A complete evaluation includes B-mode imaging, spectral Doppler, color Doppler, and power Doppler as needed of all accessible portions of each vessel. Bilateral testing is considered an integral part of a complete examination. Limited examinations for reoccurring indications may be performed as noted.  Right Carotid Findings: +----------+--------+-------+--------+--------------------------------+--------+           PSV cm/sEDV    StenosisPlaque Description              Comments                   cm/s                                                    +----------+--------+-------+--------+--------------------------------+--------+ CCA Prox  110     17             smooth, heterogenous and                                                  calcific                                 +----------+--------+-------+--------+--------------------------------+--------+ CCA Distal121     17                                                      +----------+--------+-------+--------+--------------------------------+--------+ ICA Prox  147     37             smooth and hypoechoic                    +----------+--------+-------+--------+--------------------------------+--------+ ICA Distal85      22                                                      +----------+--------+-------+--------+--------------------------------+--------+ ECA       177                                                              +----------+--------+-------+--------+--------------------------------+--------+ +----------+--------+-------+------------------------------+-------------------+           PSV cm/sEDV cmsDescribe                      Arm Pressure (mmHG) +----------+--------+-------+------------------------------+-------------------+ [id number]            Stenotic. Focal heterogenous  plaque at proximal stenosis.                      +----------+--------+-------+------------------------------+-------------------+ +---------+--------+--+--------+-+-----------------------------+ VertebralPSV cm/s25EDV cm/s8Antegrade and Bi- directional +---------+--------+--+--------+-+-----------------------------+  Left Carotid Findings: +----------+--------+-------+--------+--------------------------------+--------+           PSV cm/sEDV    StenosisPlaque Description              Comments                   cm/s                                                    +----------+--------+-------+--------+--------------------------------+--------+ CCA Prox  75      9                                                       +----------+--------+-------+--------+--------------------------------+--------+ CCA Distal68      13             calcific and irregular                   +----------+--------+-------+--------+--------------------------------+--------+ ICA Prox  250     68             smooth, heterogenous and                                                  calcific                                 +----------+--------+-------+--------+--------------------------------+--------+ ICA Mid   191     40                                                      +----------+--------+-------+--------+--------------------------------+--------+ ICA Distal64      15                                                       +----------+--------+-------+--------+--------------------------------+--------+ ECA       263                    calcific                                 +----------+--------+-------+--------+--------------------------------+--------+ +----------+--------+--------+----------------+-------------------+           PSV cm/sEDV cm/sDescribe        Arm Pressure (mmHG) +----------+--------+--------+----------------+-------------------+ Subclavian150             Multiphasic, WNL                    +----------+--------+--------+----------------+-------------------+ +---------+--------+--+--------+--+---------+  VertebralPSV cm/s92EDV cm/s13Antegrade +---------+--------+--+--------+--+---------+   Summary: Right Carotid: Velocities in the right ICA are consistent with a 40-59%                stenosis. Non-hemodynamically significant plaque <50% noted in                the CCA. Patent right endarterectomy with mixed echogenicity                plaque noted throughout proximal ICA. Left Carotid: Velocities in the left ICA are consistent with a 60-79% stenosis.               The ECA appears >50% stenosed. Vertebrals:  Left vertebral artery demonstrates antegrade flow. Right vertebral              artery demonstrates bidirectional flow, suggestive of proximal              obstruction. Subclavians: Proximal right subclavian artery was stenotic. Normal flow              hemodynamics were seen in the left subclavian artery. *See table(s) above for measurements and observations.  Electronically signed by Heath Lark on 07/05/2021 at 5:30:13 PM.    Final    Disposition   Pt is being discharged home today in good condition.  Follow-up Plans & Appointments     Follow-up Information     Baldo Daub, MD Follow up on 08/02/2021.   Specialties: Cardiology, Radiology Why: Hospital follow-up with Cardiology scheduled for 9/2 at 10am. Please arrive 15 minutes early for check-in. If this date/time does  not work for you, please call our office to reschedule. [place] available appt. Contact information: [address] [place] [address] [phone]                Discharge Instructions     (HEART FAILURE PATIENTS) Call MD:  Anytime you have any of the following symptoms: 1) 3 pound weight gain in 24 hours or 5 pounds in 1 week 2) shortness of breath, with or without a dry hacking cough 3) swelling in the hands, feet or stomach 4) if you have to sleep on extra pillows at night in order to breathe.   Complete by: As directed    Amb Referral to Cardiac Rehabilitation   Complete by: As directed    Will send Cardiac Rehab Phase 2 referral to [place]   Diagnosis:  Coronary Stents STEMI     After initial evaluation and assessments completed: Virtual Based Care may be provided alone or in conjunction with Phase 2 Cardiac Rehab based on patient barriers.: Yes   Call MD for:  difficulty breathing, headache or visual disturbances   Complete by: As directed    Call MD for:  redness, tenderness, or signs of infection (pain, swelling, redness, odor or green/yellow discharge around incision site)   Complete by: As directed    Diet - low sodium heart healthy   Complete by: As directed    Discharge instructions   Complete by: As directed    Groin Site Care Refer to this sheet in the next few weeks. These instructions provide you with information on caring for yourself after your procedure. Your caregiver may also give you more specific instructions. Your treatment has been planned according to current medical practices, but problems sometimes occur. Call your caregiver if you have any problems or questions after your  procedure. HOME CARE INSTRUCTIONS You may shower 24 hours after the procedure. Remove the bandage (dressing) and gently wash the site with plain soap and water. Gently pat the site dry.  Do not apply powder or lotion to the  site.  Do not sit in a bathtub, swimming pool, or whirlpool for 5 to 7 days.  No bending, squatting, or lifting anything over 10 pounds (4.5 kg) as directed by your caregiver.  Inspect the site at least twice daily.  Do not drive home if you are discharged the same day of the procedure. Have someone else drive you.  You may drive 24 hours after the procedure unless otherwise instructed by your caregiver.  What to expect: Any bruising will usually fade within 1 to 2 weeks.  Blood that collects in the tissue (hematoma) may be painful to the touch. It should usually decrease in size and tenderness within 1 to 2 weeks.  SEEK IMMEDIATE MEDICAL CARE IF: You have unusual pain at the groin site or down the affected leg.  You have redness, warmth, swelling, or pain at the groin site.  You have drainage (other than a small amount of blood on the dressing).  You have chills.  You have a fever or persistent symptoms for more than 72 hours.  You have a fever and your symptoms suddenly get worse.  Your leg becomes pale, cool, tingly, or numb.  You have heavy bleeding from the site. Hold pressure on the site. Marland Kitchen  PLEASE DO NOT MISS ANY DOSES OF YOUR BRILINTA!!!!! Also keep a log of you blood pressures and bring back to your follow up appt. Please call the office with any questions.   Patients taking blood thinners should generally stay away from medicines like ibuprofen, Advil, Motrin, naproxen, and Aleve due to risk of stomach bleeding. You may take Tylenol as directed or talk to your primary doctor about alternatives.   PLEASE ENSURE THAT YOU DO NOT RUN OUT OF YOUR BRILINTA. This medication is very important to remain on for at least one year. IF you have issues obtaining this medication due to cost please CALL the office 3-5 business days prior to running out in order to prevent missing doses of this medication.   Increase activity slowly   Complete by: As directed        Discharge Medications    Allergies as of 07/06/2021   No Known Allergies      Medication List     STOP taking these medications    lisinopril 10 MG tablet Commonly known as: ZESTRIL       TAKE these medications    albuterol 108 (90 Base) MCG/ACT inhaler Commonly known as: VENTOLIN HFA Inhale 2 puffs into the lungs every 6 (six) hours as needed for shortness of breath.   aspirin EC 81 MG tablet Take 1 tablet (81 mg total) by mouth daily.   atorvastatin 80 MG tablet Commonly known as: LIPITOR TAKE ONE TABLET BY MOUTH EVERY EVENING   carvedilol 3.125 MG tablet Commonly known as: COREG Take 1 tablet (3.125 mg total) by mouth 2 (two) times daily with a meal. What changed:  medication strength how much to take   empagliflozin 10 MG Tabs tablet Commonly known as: JARDIANCE Take 1 tablet (10 mg total) by mouth daily. Start taking on: July 07, 2021   ezetimibe 10 MG tablet Commonly known as: ZETIA Take 1 tablet (10 mg total) by mouth daily. Start taking on: July 07, 2021  losartan 25 MG tablet Commonly known as: COZAAR Take 1 tablet (25 mg total) by mouth daily. Start taking on: July 07, 2021   SOS-AMI selatogrel or placebo 16 mg/0.5 mL Inj SQ injection Inject 16 mg into the skin as needed. Inject 0.5 mL subcutaneously in the abdomen or thigh if you experience symptoms of a heart attack. Call 911 immediately and seek emergency medical support.   spironolactone 25 MG tablet Commonly known as: ALDACTONE TAKE ONE TABLET BY MOUTH EVERY DAY   ticagrelor 90 MG Tabs tablet Commonly known as: BRILINTA Take 1 tablet (90 mg total) by mouth 2 (two) times daily.         Outstanding Labs/Studies   BMET in one week (message sent to office/IMPACT clinic)  FLP/LFTs in 8 weeks  Echo in 3 months  Duration of Discharge Encounter   Greater than 30 minutes including physician time.  Signed, Laverda PageLindsay Reba Hulett, NP 07/06/2021, 10:33 AM

## 2021-07-08 ENCOUNTER — Telehealth (HOSPITAL_COMMUNITY): Payer: Self-pay

## 2021-07-08 ENCOUNTER — Other Ambulatory Visit: Payer: Self-pay

## 2021-07-08 DIAGNOSIS — I110 Hypertensive heart disease with heart failure: Secondary | ICD-10-CM

## 2021-07-08 NOTE — Telephone Encounter (Signed)
Per phase I cardiac rehab, fax cardiac rehab referral to [facility]. °

## 2021-07-08 NOTE — Telephone Encounter (Signed)
Contacted patient to schedule HF TOC appt. Explained purpose for visit and provided directions/contact info. All questions answered. Appt has been scheduled for 8/18.

## 2021-07-15 NOTE — Progress Notes (Addendum)
[facility]  PCP: Keane Police Primary Cardiologist: Norman Herrlich  HPI:  Lance Ayala is a 65 y.o.  male  with a PMH significant for CAD s/p CABG , chronic combined HF, carotid artery disease (R CEA 12/2017), LBBB, HLD, HTN, lung nodule, COPD, TIA   No records available but on chart review he apparently had an MI around 2012 and found to have LBBB. Received multiple stents at [facility] .  Later admitted to [facility] 10/2017 with acute HFrEF EF 25%.  Had myoview but concern for balanced ischemia especially given multivessel disease history.  Decision made to repeat LHC which demonstrated severe multivessel disease with CABG recommended.  Further workup also showed severe carotid artery disease.  He underwent right CEA 12/2017 then after recovering from that was brought back for elective CABG x4 on 01/2018 with LIMA to LAD, SVG to PDA, sequential SVG to OM1 and OM 2  Managed in the outpatient setting since then until he presented to [facility] on 07/03/21 with chest pain. EKG at [facility] showed sinus rhythm, 68 bpm, known left bundle branch block with acute ST elevation >50mm in inferior leads. Code STEMI was activated and patient was transferred to [facility] patient on arrival rated his chest pain 8/10.  He was given sublingual nitro while at [facility] with subsequent hypotension which was corrected with IV fluids.  Taken for emergent LHC for acute inferior STEMI:  Severe multivessel disease, Culprit lesion long stenotic RCA segment with successful 2 site PCI of the RCA:  TIMI-3 flow post PCI.  2 out of 4 original grafts Patent: LIMA-LAD, limb 1 ofSVG-OM1/RI with occluded Sequential limb; flush Occluded SVG-rPDA.  ECHO with EF 25-30%, new akinesis of inferior segment, moderately reduced RV function.  GDMT carvedilol 3.125 BID, losartan 25 daily, spiro 25 daily, Jardiance 10 daily.    Here for hospital FU.  Feels a lot better  since hospitalization.  Getting his strength back.  Walking more up to about a half a mile about every other day and does well gets a little tired afterward but not dyspneic having to catch his breath.  Denies chest pain, orthopnea.  BP at home has been well controlled 110-130 systolic.    ROS: All systems negative except as listed in HPI, PMH and Problem List.  SH:  Social History   Socioeconomic History   Marital status: Single    Spouse name: Not on file   Number of children: Not on file   Years of education: Not on file   Highest education level: Not on file  Occupational History   Not on file  Tobacco Use   Smoking status: Former    Packs/day: 1.00    Years: 20.00    Pack years: 20.00    Types: Cigarettes    Quit date: 12/01/1998    Years since quitting: 22.6    Passive exposure: Never   Smokeless tobacco: Never  Vaping Use   Vaping Use: Never used  Substance and Sexual Activity   Alcohol use: No   Drug use: No   Sexual activity: Not on file  Other Topics Concern   Not on file  Social History Narrative   Not on file   Social Determinants of Health   Financial Resource Strain: Not on file  Food Insecurity: Not on file  Transportation Needs: Not on file  Physical Activity: Not on file  Stress: Not on file  Social Connections: Not on  file  Intimate Partner Violence: Not on file    FH:  Family History  Problem Relation Age of Onset   Hypertension Mother    Stroke Mother    Heart attack Father    Lung disease Brother     Past Medical History:  Diagnosis Date   CAD (coronary artery disease)    a. remote PCI. b. CABG x 4 01/2018.   Carotid artery disease (HCC)    a. s/p R CEA 12/2017 - followed by vascular.   Cholelithiasis    Chronic combined systolic and diastolic CHF (congestive heart failure) (HCC)    a. LVEF 35-40% in 10/2017.   COPD (chronic obstructive pulmonary disease) (HCC) 10/21/2017   Dyslipidemia    Hypertension 10/21/2017   LBBB (left bundle  branch block)    Lung nodule    a. lung nodules/mediastinal/axillary lymphadenopathy by CT 10/2017.   Myocardial infarction (HCC) 10/21/2017   TIA (transient ischemic attack)    " Mini stroke in 2000"   Wears dentures     Current Outpatient Medications  Medication Sig Dispense Refill   albuterol (VENTOLIN HFA) 108 (90 Base) MCG/ACT inhaler Inhale 2 puffs into the lungs every 6 (six) hours as needed for shortness of breath.     aspirin EC 81 MG tablet Take 1 tablet (81 mg total) by mouth daily. 90 tablet 3   atorvastatin (LIPITOR) 80 MG tablet TAKE ONE TABLET BY MOUTH EVERY EVENING 90 tablet 2   carvedilol (COREG) 3.125 MG tablet Take 1 tablet (3.125 mg total) by mouth 2 (two) times daily with a meal. 60 tablet 1   empagliflozin (JARDIANCE) 10 MG TABS tablet Take 1 tablet (10 mg total) by mouth daily. 30 tablet 0   ezetimibe (ZETIA) 10 MG tablet Take 1 tablet (10 mg total) by mouth daily. 30 tablet 0   losartan (COZAAR) 25 MG tablet Take 1 tablet (25 mg total) by mouth daily. 30 tablet 0   spironolactone (ALDACTONE) 25 MG tablet TAKE ONE TABLET BY MOUTH EVERY DAY 90 tablet 3   Study - SOS-AMI - selatogrel 16 mg/0.5 mL or placebo SQ injection (PI-Christopher) Inject 16 mg into the skin as needed. Inject 0.5 mL subcutaneously in the abdomen or thigh if you experience symptoms of a heart attack. Call 911 immediately and seek emergency medical support.     ticagrelor (BRILINTA) 90 MG TABS tablet Take 1 tablet (90 mg total) by mouth 2 (two) times daily. 60 tablet 0   No current facility-administered medications for this encounter.    Vitals:   07/18/21 1049 07/18/21 1052  BP: (!) 168/74 (!) 160/90  Pulse: 76   SpO2: 98%   Weight: 70.8 kg (156 lb)     PHYSICAL EXAM: Cardiac: JVD flat, normal rate and rhythm, clear s1 and s2, no murmurs, rubs or gallops, no LE edema Pulmonary: CTAB, not in distress Abdominal: non distended abdomen, soft and nontender Psych: Alert, conversant, in good  spirits   ASSESSMENT & PLAN: Chronic Combined Systolic and Diastolic CHF Ischemic Cardiomyopathy -PCI ~2012, CABG x4 on 01/2018 EF 25% -PCI 07/2021 to RCA x2 DES -07/2021 EF 25-30%, new akinesis of inferior segment, moderately reduced RV function. -NYHA Class II symptoms, euvolemic on exam -Continue losartan 25mg , jardiance 10, spiro 25 -BP high here but well controlled at home, regardless he should have enough bp room.  We will increase carvedilol to 6.25mg  BID  -hold off on other GDMT titration for now given last cr value -repeat echo in a  few months likely will need CRT-D   AKI -cr day of discharge 1.84 up from baseline of 1.0 -repeat bmp today   Follow up with [facility]

## 2021-07-17 ENCOUNTER — Telehealth (HOSPITAL_COMMUNITY): Payer: Self-pay

## 2021-07-17 NOTE — Telephone Encounter (Signed)
Call attempted to confirm HV TOC appt 8/18 @ 11AM. HIPPA appropriate VM left with callback number.   Ozella Rocks, RN, BSN Heart Failure Nurse Navigator [phone]

## 2021-07-18 ENCOUNTER — Encounter (HOSPITAL_COMMUNITY): Payer: Self-pay

## 2021-07-18 ENCOUNTER — Ambulatory Visit (HOSPITAL_COMMUNITY)
Admission: RE | Admit: 2021-07-18 | Discharge: 2021-07-18 | Disposition: A | Discharge status: Home / Self Care | Payer: Medicare Other | Source: Ambulatory Visit | Attending: Internal Medicine | Admitting: Internal Medicine

## 2021-07-18 ENCOUNTER — Other Ambulatory Visit: Payer: Self-pay

## 2021-07-18 VITALS — BP 160/90 | HR 76 | Wt 156.0 lb

## 2021-07-18 DIAGNOSIS — I255 Ischemic cardiomyopathy: Secondary | ICD-10-CM | POA: Insufficient documentation

## 2021-07-18 DIAGNOSIS — Z955 Presence of coronary angioplasty implant and graft: Secondary | ICD-10-CM | POA: Diagnosis not present

## 2021-07-18 DIAGNOSIS — Z79899 Other long term (current) drug therapy: Secondary | ICD-10-CM | POA: Diagnosis not present

## 2021-07-18 DIAGNOSIS — I110 Hypertensive heart disease with heart failure: Secondary | ICD-10-CM | POA: Insufficient documentation

## 2021-07-18 DIAGNOSIS — Z951 Presence of aortocoronary bypass graft: Secondary | ICD-10-CM | POA: Insufficient documentation

## 2021-07-18 DIAGNOSIS — Z8249 Family history of ischemic heart disease and other diseases of the circulatory system: Secondary | ICD-10-CM | POA: Insufficient documentation

## 2021-07-18 DIAGNOSIS — N179 Acute kidney failure, unspecified: Secondary | ICD-10-CM | POA: Diagnosis not present

## 2021-07-18 DIAGNOSIS — Z7982 Long term (current) use of aspirin: Secondary | ICD-10-CM | POA: Diagnosis not present

## 2021-07-18 DIAGNOSIS — I25119 Atherosclerotic heart disease of native coronary artery with unspecified angina pectoris: Secondary | ICD-10-CM

## 2021-07-18 DIAGNOSIS — I5042 Chronic combined systolic (congestive) and diastolic (congestive) heart failure: Secondary | ICD-10-CM | POA: Diagnosis not present

## 2021-07-18 LAB — BASIC METABOLIC PANEL
Anion gap: 7 (ref 5–15)
BUN: 15 mg/dL (ref 8–23)
CO2: 25 mmol/L (ref 22–32)
Calcium: 9.6 mg/dL (ref 8.9–10.3)
Chloride: 108 mmol/L (ref 98–111)
Creatinine, Ser: 1.14 mg/dL (ref 0.61–1.24)
GFR, Estimated: >60 mL/min (ref >60)
Glucose, Bld: 121 mg/dL — ABNORMAL HIGH (ref 70–99)
Potassium: 4.5 mmol/L (ref 3.5–5.1)
Sodium: 140 mmol/L (ref 135–145)

## 2021-07-18 MED ORDER — CARVEDILOL 6.25 MG PO TABS
6.2500 mg | ORAL_TABLET | Freq: Two times a day (BID) | ORAL | 0 refills | Status: AC
Start: 2021-07-18 — End: ?

## 2021-07-18 NOTE — Patient Instructions (Addendum)
Labs done today.  Increase Carvedilol to 6.25mg  twice daily.  Please keep your pending appointment on September 2nd with Dr. Dulce Sellar at 10am.

## 2021-07-22 ENCOUNTER — Encounter: Payer: Self-pay | Admitting: *Deleted

## 2021-07-22 DIAGNOSIS — Z006 Encounter for examination for normal comparison and control in clinical research program: Secondary | ICD-10-CM

## 2021-07-22 NOTE — Research (Signed)
VISIT 2 phone call,  Patient was seen in the [facility] clinic; increased his Coreg to 6.25 bid and he thinks this change has helped him. Mr. Comas is taking his other medications according to directions. He has been doing well and is trying to walk more each day.  He is to start cardiac Rehab next week at [facility] and will see Dr. Dulce Sellar on 08-02-21 for his hospital follow-up appt.   All information regarding this SOS autoinjector was reviewed and he had no questions.                        [id number], [id number]     FOLLOW-UP PHONE CALLS  SUBJECT ID:  [id number] Trainer's name: Eulogio Ditch, RN  Trainer's signature: on Delegation of Authority Log Date of the Follow up call:    22-Jul-2021  Visit Number: 2 Start time of the Follow up call:   12:30       End time of call: 12:40   - CONTACT  Q1;  Was the follow up phone call done with the subject?  [x]   YES  []   NO           If NO, check the following:      Q2:    Was the follow up phone call done with a family member               Or caregiver?    []   YES   [x]   NO          Make note about reasons _______son at work____    - MEDICAL CONDITION      Q3:  Did any of the following occur?   []   Death       []   Hospitalization (any cause)    NONE     []   Use of autoinjector  Make note about the type of event, and when it occurred. In case of hospitalization: the Location of the hospital and/or the treating physician's contact details  ____________ ____________________________________________________________________ ____________________________________________________________________  Note: remember to report any SAE/AE which has occurred within 30 days after any  injection of the study drug on relevant eCRF forms.   Q4:  Did the subject develop any condition which is an          exclusion criterion?  []   YES  [x]   NO   Take note about the occurrence of any exclusion criterion after  randomization: _________________________________________________________________ __________________________________________________________________  Q5: Was there any change in subject's antithrombotic therapy?   []   YES  [x]   NO     Take not about any change of antithrombotic treatment: _______________________ ____________________________________________________________________ ____________________________________________________________________      OF THE STUDY-SPECFIC TRAINING  Q6: Did the subject correctly reply to the following questions?       A.  What are common heart attack symptoms?      [x]   YES   []   NO      B.  What has to be done in case any of those symptoms occurs? [x]   YES  []   NO      C.  What are the main steps to perform a self-injection?  [x]   YES  []   NO             If NO, then report which step/s was/were missing:        []   Choose injection site (abdomen or thigh)       []   Twist cap off       []   Pinch skin and place the study autoinjector       []   Firmly push down and hold for 3 seconds       D. What has to be done immediately after an injection?  [x]   YES   []   NO          If NO, then report which step/s was/were missing:       []   Call  for emergency medical help       []   Show the autoinjector to the emergency medical responder       E.  Does the subject recall where s/he keeps/ stores the autoinjectors? [x]  YES  []  NO          Note the place of storage and any corrective explanation if needed below __________________________________________________________________ __________________________________________________________________  - TRAINING REFRESHER   Q7:  Is a training refresher needed?      []  YES  [x]  NO        If YES, indicate items that have to be refreshed. More than one may apply:               []   Heart attack symptoms             []   Actions to be taken following heart attack symptoms             []   Steps to perform  the self-injection and follow-up actions to be taken   []   Other, Specify  _________________________________________     Form Based on IDORSIA SOS-AMI_CRF_Version 5.0_27Oct2021 [id number], 62,            and eCRF  Version 5.0-  26-Sep-2020     Current Outpatient Medications:    albuterol (VENTOLIN HFA) 108 (90 Base) MCG/ACT inhaler, Inhale 2 puffs into the lungs every 6 (six) hours as needed for shortness of breath., Disp: , Rfl:    aspirin EC 81 MG tablet, Take 1 tablet (81 mg total) by mouth daily., Disp: 90 tablet, Rfl: 3   atorvastatin (LIPITOR) 80 MG tablet, TAKE ONE TABLET BY MOUTH EVERY EVENING, Disp: 90 tablet, Rfl: 2   carvedilol (COREG) 6.25 MG tablet, Take 1 tablet (6.25 mg total) by mouth 2 (two) times daily with a meal., Disp: 60 tablet, Rfl: 0   empagliflozin (JARDIANCE) 10 MG TABS tablet, Take 1 tablet (10 mg total) by mouth daily., Disp: 30 tablet, Rfl: 0   ezetimibe (ZETIA) 10 MG tablet, Take 1 tablet (10 mg total) by mouth daily., Disp: 30 tablet, Rfl: 0   losartan (COZAAR) 25 MG tablet, Take 1 tablet (25 mg total) by mouth daily., Disp: 30 tablet, Rfl: 0   spironolactone (ALDACTONE) 25 MG tablet, TAKE ONE TABLET BY MOUTH EVERY DAY, Disp: 90 tablet, Rfl: 3   Study - SOS-AMI - selatogrel 16 mg/0.5 mL or placebo SQ injection (PI-Christopher), Inject 16 mg into the skin as needed. Inject 0.5 mL subcutaneously in the abdomen or thigh if you experience symptoms of a heart attack. Call 911 immediately and seek emergency medical support., Disp: , Rfl:    ticagrelor (BRILINTA) 90 MG TABS tablet, Take 1 tablet (90 mg total) by mouth 2 (two) times daily., Disp: 60 tablet, Rfl: 0

## 2021-08-01 NOTE — Progress Notes (Signed)
Cardiology Office Note:    Date:  08/02/2021   ID:  Lance Ayala, DOB 1956/04/03, MRN [id number]  PCP:  Lance Pickler, MD  Cardiologist:  Lance Herrlich, MD    Referring MD: Lance Ayala, *    ASSESSMENT:    1. Coronary artery disease involving native coronary artery of native heart with angina pectoris (HCC)   2. Chronic systolic heart failure (HCC)   3. Hypertensive heart disease with heart failure (HCC)   4. Mixed hyperlipidemia    PLAN:    In order of problems listed above:  Symptomatically is doing quite well after his recent presentation with steady severe LV dysfunction and decompensated heart failure.  He has had no recurrent angina and is compliant with his medications.  Hemodynamically stable despite hypotension in the hospital tolerates Cozaar will transition to low-dose Entresto.  I will see sooner in 4 weeks to uptitrate.  Continue his other guideline directed therapy including beta-blocker SGLT2 inhibitor MRA and his high intensity statin.  I will wait 2 weeks check renal function lipid profile proBNP and see back in the office in 4 weeks for up titration.  He understands he may require an ICD.  We will plan to recheck echocardiogram November.  Stressed the need for compliance with medications and he understands his recent MI have been precipitated by not taking his medications   Next appointment: 1 month   Medication Adjustments/Labs and Tests Ordered: Current medicines are reviewed at length with the patient today.  Concerns regarding medicines are outlined above.  No orders of the defined types were placed in this encounter.  No orders of the defined types were placed in this encounter.   Chief Complaint  Patient presents with   Follow-up   Coronary Artery Disease     History of Present Illness:    Lance Ayala is a 65 y.o. male with a hx of CAD bypass surgery left bundle branch block and systolic heart failure with EF improved from initial  25-30 5 to 40% last seen by me 11/18/2018.  At that time his heart failure was compensated New York Heart Association class I and was on a good medical regimen including beta-blocker MRA and ACE inhibitor.  Compliance with diet, lifestyle and medications: Yes  Since discharge statin.  He had no chest pain no pain orthopnea palpitations syncope short of breath only when he arteries and walks no longer distance.  Home blood pressure runs in the range of 1 30-1 40 systolic and has had no recurrent hypotension on his ARB.  He has been in contact with cardiac rehabilitation. In retrospect he said he had been on his medications for months before he had his recurrent myocardial infarction he is compliant with current medications and fortunately has healthcare benefits through Medicare.  He tolerates his statin without muscle pain or weakness in his dual antiplatelet therapy without bleeding.  He recently presented with acute inferior ST elevation MI underwent emergent left heart catheterization with subtotal occlusion of mid right coronary artery and 80% distal right internal carotid artery stenosis and occlusion of the saphenous vein graft to the right posterior descending artery he had successful PCI and stent of his right coronary artery in the proximal to mid segment in the mid to distal segment with good result.  His left ventricular filling pressures were severely elevated during the procedure requiring IV diuretic and ejection fraction is reduced estimated in the range of 25%.  Echocardiogram confirms severe left ventricular dysfunction EF  25 to 30% moderate right ventricular dysfunction.  He is initiated on SGLT2 inhibitor his carvedilol dosage was decreased due to hypotension was able to tolerate low-dose losartan with an intent to transition to [facility] as an outpatient and reassess the ejection fraction 3 months and make a decision regarding the merits of an ICD.  Diagnostic Studies/Procedures     Cath: 07/03/21    Ost RCA to Prox RCA Stent is 5% stenosed.   CULPRIT LESION: Prox RCA to Mid RCA lesion is 99% stenosed.   A stent was successfully placed using a SYNERGY XD 2.75X16. Deployed to 3.0 mm   Post intervention, there is a 0% residual stenosis.   LESION #2: Mid RCA lesion is 80% stenosed.   A stent was successfully placed using a SYNERGY XD 2.50X12. Deployed to 2.8 mm   Post intervention, there is a 0% residual stenosis.   -----------------LEFT CORONARY ARTERY -------------------   Mid LM to Ost LAD lesion is 45% stenosed with 95% stenosed side branch in Ost Cx to Prox Cx.   Ost Ramus to Ramus lesion is 99% stenosed.   Ost 1st Mrg to 1st Mrg lesion is 100% stenosed.   Ost LAD to Prox LAD lesion is 50% stenosed. Prox LAD to Mid LAD lesion is 100% stenosed after 1st Diag branch.   Ost 1st Diag to 1st Diag lesion is 55% stenosed.   ----------------------------GRAFTS--------------------------------   Dist Graft to Insertion lesion between Ramus and 1st Mrg  is 100% stenosed.   SVG graft was not visualized due to known occlusion. Origin lesion is 100% stenosed.   Seq SVG- Seq SVG-RI/OM1-OM1/2 graft was visualized by angiography and is normal in caliber. There is no competitive flow; the Sequential limb is occluded.   LIMA graft was visualized by angiography and is normal in caliber.  The graft exhibits no disease.  The flow is not reversed. There is no competitive flow   -------------HEMODYNAMICS-----------------------   There is severe left ventricular systolic dysfunction.  The left ventricular ejection fraction is 25-35% by visual estimate.   LV end diastolic pressure is severely elevated.   SUMMARY Acute Inferior ST Elevation MI due to subtotal 95% occlusion of the mid RCA with 80% distal RCA stenosis, and flush occlusion of SVG-RPDA; 5% ISR in ostial and proximal RCA stent.  TIMI-3 flow pre-PCI Successful 2 site PCI of the RCA:  TIMI-3 flow post PCI Prox-Mid RCA 95% reduced to  0% (Synergy DES 2.75 mm x  100mm deployed to 3.0 mm); Mid-Distal RCA 80% reduced to 0% (Synergy DES 2.5 mm x 12 mm deployed to 2.8 mm) . Severe LCA CAD with ostial LCx 95% stenosis feeding only a small caliber AV groove LCx; flush occluded high OM1/RI stent, CTO of 2ndOM; proximal LAD long 40% stenosis feeding major Diag 1l branch, the LAD is occluded after Diag branch. 2 out of 4 original grafts Patent: LIMA-LAD, limb 1 ofSVG-OM1/RI with occluded Sequential limb; flush Occluded SVG-rPDA. ACUTE ON CHRONIC COMBINED SYSTOLIC AND DIASTOLIC HEART FAILURE: -- Pre-PCI and diuresis-severely elevated LVEDP of 34 million mercury reduced to 14 million mercury after diuresis -- Severe reduced LVEF of roughly 25% with dense anterior and also now inferior hypokinesis. Diagnostic Dominance: Right     Intervention       Echo: 07/04/21   IMPRESSIONS     1. Left ventricular ejection fraction, by estimation, is 25 to 30%. The  left ventricle has severely decreased function. The left ventricle  demonstrates regional wall motion abnormalities with basal  to mid inferior  akinesis, septal severe hypokinesis  with septal-lateral dyssynchrony consistent with LBBB. The left  ventricular internal cavity size was mildly dilated. Left ventricular  diastolic parameters are consistent with Grade I diastolic dysfunction  (impaired relaxation).   2. Right ventricular systolic function is moderately reduced. The right  ventricular size is normal. Tricuspid regurgitation signal is inadequate  for assessing PA pressure.   3. The aortic valve is tricuspid. Aortic valve regurgitation is not  visualized. No aortic stenosis is present.   4. The inferior vena cava is normal in size with greater than 50%  respiratory variability, suggesting right atrial pressure of 3 mmHg.   5. The mitral valve is normal in structure. No evidence of mitral valve  regurgitation. No evidence of mitral stenosis.   Past Medical History:   Diagnosis Date   CAD (coronary artery disease)    a. remote PCI. b. CABG x 4 01/2018.   Carotid artery disease (HCC)    a. s/p R CEA 12/2017 - followed by vascular.   Cholelithiasis    Chronic combined systolic and diastolic CHF (congestive heart failure) (HCC)    a. LVEF 35-40% in 10/2017.   COPD (chronic obstructive pulmonary disease) (HCC) 10/21/2017   Dyslipidemia    Hypertension 10/21/2017   LBBB (left bundle branch block)    Lung nodule    a. lung nodules/mediastinal/axillary lymphadenopathy by CT 10/2017.   Myocardial infarction (HCC) 10/21/2017   TIA (transient ischemic attack)    " Mini stroke in 2000"   Wears dentures     Past Surgical History:  Procedure Laterality Date   CORONARY ANGIOPLASTY WITH STENT PLACEMENT     2 stents   CORONARY ARTERY BYPASS GRAFT N/A 01/25/2018   Procedure: CORONARY ARTERY BYPASS GRAFTING (CABG), ON PUMP, TIMES FOUR, USING LEFT INTERNAL MAMMARY ARTERY AND ENDOSCOPICALLY HARVESTED RIGHT GREATER SAPHENOUS VEIN;  Surgeon: Kerin Perna, MD;  Location: [facility] OR;  Service: Open Heart Surgery;  Laterality: N/A;   CORONARY STENT INTERVENTION Right 07/03/2021   Procedure: CORONARY STENT INTERVENTION;  Surgeon: Marykay Lex, MD;  Location: [facility] INVASIVE CV LAB;  Service: Cardiovascular;  Laterality: Right;   ENDARTERECTOMY Right 12/28/2017   Procedure: RIGHT CAROTID ENDARTERECTOMY;  Surgeon: [name], MD;  Location: [facility] OR;  Service: Vascular;  Laterality: Right;   LEFT HEART CATH AND CORONARY ANGIOGRAPHY N/A 10/29/2017   Procedure: LEFT HEART CATH AND CORONARY ANGIOGRAPHY;  Surgeon: Tonny Bollman, MD;  Location: [facility] INVASIVE CV LAB;  Service: Cardiovascular;  Laterality: N/A;   LEFT HEART CATH AND CORONARY ANGIOGRAPHY N/A 07/03/2021   Procedure: LEFT HEART CATH AND CORONARY ANGIOGRAPHY;  Surgeon: Marykay Lex, MD;  Location: [facility] INVASIVE CV LAB;  Service: Cardiovascular;  Laterality: N/A;   MULTIPLE TOOTH EXTRACTIONS     PATCH ANGIOPLASTY Right  12/28/2017   Procedure: WITH  HEMASHIELD  0.3 X 3 IN  USE AS PATCH ANGIOPLASTY;  Surgeon: [name], MD;  Location: [facility] OR;  Service: Vascular;  Laterality: Right;   TEE WITHOUT CARDIOVERSION N/A 01/25/2018   Procedure: TRANSESOPHAGEAL ECHOCARDIOGRAM (TEE);  Surgeon: Donata Clay, Theron Arista, MD;  Location: [facility] OR;  Service: Open Heart Surgery;  Laterality: N/A;    Current Medications: Current Meds  Medication Sig   albuterol (VENTOLIN HFA) 108 (90 Base) MCG/ACT inhaler Inhale 2 puffs into the lungs every 6 (six) hours as needed for shortness of breath.   aspirin EC 81 MG tablet Take 1 tablet (81 mg total) by mouth daily.  atorvastatin (LIPITOR) 80 MG tablet TAKE ONE TABLET BY MOUTH EVERY EVENING   carvedilol (COREG) 6.25 MG tablet Take 1 tablet (6.25 mg total) by mouth 2 (two) times daily with a meal.   empagliflozin (JARDIANCE) 10 MG TABS tablet Take 1 tablet (10 mg total) by mouth daily.   ezetimibe (ZETIA) 10 MG tablet Take 1 tablet (10 mg total) by mouth daily.   spironolactone (ALDACTONE) 25 MG tablet TAKE ONE TABLET BY MOUTH EVERY DAY   Study - [facility] - selatogrel 16 mg/0.5 mL or placebo SQ injection (PI-Christopher) Inject 16 mg into the skin as needed. Inject 0.5 mL subcutaneously in the abdomen or thigh if you experience symptoms of a heart attack. Call 911 immediately and seek emergency medical support.   ticagrelor (BRILINTA) 90 MG TABS tablet Take 1 tablet (90 mg total) by mouth 2 (two) times daily.   [DISCONTINUED] losartan (COZAAR) 25 MG tablet Take 1 tablet (25 mg total) by mouth daily.     Allergies:   Patient has no known allergies.   Social History   Socioeconomic History   Marital status: Single    Spouse name: Not on file   Number of children: Not on file   Years of education: Not on file   Highest education level: Not on file  Occupational History   Not on file  Tobacco Use   Smoking status: Former    Packs/day: 1.00    Years: 20.00    Pack years: 20.00    Types:  Cigarettes    Quit date: 12/01/1998    Years since quitting: 22.6    Passive exposure: Never   Smokeless tobacco: Never  Vaping Use   Vaping Use: Never used  Substance and Sexual Activity   Alcohol use: No   Drug use: No   Sexual activity: Not on file  Other Topics Concern   Not on file  Social History Narrative   Not on file   Social Determinants of Health   Financial Resource Strain: Not on file  Food Insecurity: Not on file  Transportation Needs: Not on file  Physical Activity: Not on file  Stress: Not on file  Social Connections: Not on file     Family History: The patient's family history includes Heart attack in his father; Hypertension in his mother; Lung disease in his brother; Stroke in his mother. ROS:   Please see the history of present illness.    All other systems reviewed and are negative.  EKGs/Labs/Other Studies Reviewed:    The following studies were reviewed today:  EKG:  EKG 07/08/2021 showed sinus rhythm left atrial enlargement atypical left bundle branch block  Recent Labs: 07/04/2021: Hemoglobin 14.1; Platelets 200 07/18/2021: BUN 15; Creatinine, Ser 1.14; Potassium 4.5; Sodium 140  Recent Lipid Panel    Component Value Date/Time   CHOL 190 07/05/2021 0200   CHOL 124 02/16/2018 1133   TRIG 189 (H) 07/05/2021 0200   HDL 36 (L) 07/05/2021 0200   HDL 39 (L) 02/16/2018 1133   CHOLHDL 5.3 07/05/2021 0200   VLDL 38 07/05/2021 0200   LDLCALC 116 (H) 07/05/2021 0200   LDLCALC 58 02/16/2018 1133    Physical Exam:    VS:  BP 130/72 (BP Location: Right Arm, Patient Position: Sitting, Cuff Size: Normal)   Pulse 84   Ht 6' (1.829 m)   Wt 154 lb 0.6 oz (69.9 kg)   SpO2 98%   BMI 20.89 kg/m     Wt Readings from Last 3  Encounters:  08/02/21 154 lb 0.6 oz (69.9 kg)  07/18/21 156 lb (70.8 kg)  07/03/21 156 lb 8.4 oz (71 kg)     GEN: He does not look chronically ill or debilitated he has no sounds (xanthelasma no pallor of the skin.  No ecchymoses  well nourished, well developed in no acute distress HEENT: Normal NECK: No JVD; No carotid bruits LYMPHATICS: No lymphadenopathy CARDIAC: Soft S1 no S3 RRR, no murmurs, rubs, gallops RESPIRATORY:  Clear to auscultation without rales, wheezing or rhonchi  ABDOMEN: Soft, non-tender, non-distended MUSCULOSKELETAL:  No edema; No deformity  SKIN: Warm and dry NEUROLOGIC:  Alert and oriented x 3 PSYCHIATRIC:  Normal affect    Signed, Lance Herrlich, MD  08/02/2021 10:10 AM    [facility]

## 2021-08-02 ENCOUNTER — Encounter: Payer: Self-pay | Admitting: Cardiology

## 2021-08-02 ENCOUNTER — Other Ambulatory Visit: Payer: Self-pay | Admitting: Cardiology

## 2021-08-02 ENCOUNTER — Other Ambulatory Visit: Payer: Self-pay

## 2021-08-02 ENCOUNTER — Ambulatory Visit (INDEPENDENT_AMBULATORY_CARE_PROVIDER_SITE_OTHER): Payer: Medicare Other | Admitting: Cardiology

## 2021-08-02 VITALS — BP 130/72 | HR 84 | Ht 72.0 in | Wt 154.0 lb

## 2021-08-02 DIAGNOSIS — E782 Mixed hyperlipidemia: Secondary | ICD-10-CM | POA: Diagnosis not present

## 2021-08-02 DIAGNOSIS — I25119 Atherosclerotic heart disease of native coronary artery with unspecified angina pectoris: Secondary | ICD-10-CM

## 2021-08-02 DIAGNOSIS — I110 Hypertensive heart disease with heart failure: Secondary | ICD-10-CM | POA: Diagnosis not present

## 2021-08-02 DIAGNOSIS — I5022 Chronic systolic (congestive) heart failure: Secondary | ICD-10-CM

## 2021-08-02 MED ORDER — ENTRESTO 24-26 MG PO TABS: 1.0000 | ORAL_TABLET | Freq: Two times a day (BID) | ORAL | 12 refills | Status: DC

## 2021-08-02 NOTE — Patient Instructions (Addendum)
Medication Instructions:  Your physician has recommended you make the following change in your medication:   Stop Losartan Start Entresto 24-26 mg twice daily.  *If you need a refill on your cardiac medications before your next appointment, please call your pharmacy*   Lab Work: Your physician recommends that you return for lab work in: 2 weeks You need to have labs done when you are fasting.  You can come Monday through Friday 8:30 am to 12:00 pm and 1:15 to 4:30. You do not need to make an appointment as the order has already been placed. The labs you are going to have done are CMP, ProBNP  and Lipids.  If you have labs (blood work) drawn today and your tests are completely normal, you will receive your results only by: MyChart Message (if you have MyChart) OR A paper copy in the mail If you have any lab test that is abnormal or we need to change your treatment, we will call you to review the results.   Testing/Procedures: None ordered   Follow-Up: At [facility], you and your health needs are our priority.  As part of our continuing mission to provide you with exceptional heart care, we have created designated Provider Care Teams.  These Care Teams include your primary Cardiologist (physician) and Advanced Practice Providers (APPs -  Physician Assistants and Nurse Practitioners) who all work together to provide you with the care you need, when you need it.  We recommend signing up for the patient portal called "MyChart".  Sign up information is provided on this After Visit Summary.  MyChart is used to connect with patients for Virtual Visits (Telemedicine).  Patients are able to view lab/test results, encounter notes, upcoming appointments, etc.  Non-urgent messages can be sent to your provider as well.   To learn more about what you can do with MyChart, go to [email or web address].    Your next appointment:   1 month(s)  The format for your next appointment:   In  Person  Provider:   Norman Herrlich, MD   Other Instructions NA

## 2021-08-07 ENCOUNTER — Encounter: Payer: Self-pay | Admitting: *Deleted

## 2021-08-07 DIAGNOSIS — Z006 Encounter for examination for normal comparison and control in clinical research program: Secondary | ICD-10-CM

## 2021-08-07 NOTE — Research (Signed)
[id number], [id number]     FOLLOW-UP PHONE CALLS  SUBJECT ID:  [id number] Trainer's name: Eulogio Ditch, RN Trainer's signature: on Delegation of Authority Log Date of the Follow up call:    07-Aug-2021  Visit Number: 03 Start time of the Follow up call:  0930    End time of call: 0940  Patient is doing well. No AEs, SAEs, Hospitalizations or Urgent care visits since last phone call. He has had no CP. He will be available to swap out his autoinjectors (expires 10-30-21) for new ones when he sees Dr. Dulce Sellar in October. I verified that he still has my contact information in case he has any questions or concerns.   - CONTACT  Q1;  Was the follow up phone call done with the subject?  []   YES  [x]   NO           If NO, check the following:      Q2:    Was the follow up phone call done with a family member               Or caregiver?    []   YES   [x]   NO          Make note about reasons ______Son, at work___________    - MEDICAL CONDITION      Q3:  Did any of the following occur?   []   Death       []   Hospitalization (any cause)     NONE    []   Use of autoinjector  Make note about the type of event, and when it occurred. In case of hospitalization: the Location of the hospital and/or the treating physician's contact details  ____________ ____________________________________________________________________ ____________________________________________________________________  Note: remember to report any SAE/AE which has occurred within 30 days after any  injection of the study drug on relevant eCRF forms.   Q4:  Did the subject develop any condition which is an          exclusion criterion?  []   YES  [x]   NO   Take note about the occurrence of any exclusion criterion after randomization: _________________________________________________________________ __________________________________________________________________  Q5: Was there any change in  subject's antithrombotic therapy?   []   YES  [x]   NO     Take not about any change of antithrombotic treatment: _______________________ ____________________________________________________________________ ____________________________________________________________________      OF THE STUDY-SPECFIC TRAINING  Q6: Did the subject correctly reply to the following questions?       A.  What are common heart attack symptoms?      [x]   YES   []   NO      B.  What has to be done in case any of those symptoms occurs? [x]   YES  []   NO      C.  What are the main steps to perform a self-injection?  [x]   YES  []   NO             If NO, then report which step/s was/were missing:        []   Choose injection site (abdomen or thigh)       []   Twist cap off       []   Pinch skin and place the study autoinjector       []   Firmly push down and hold for 3 seconds       D. What has to be done immediately after an injection?  [  x]  YES   []   NO          If NO, then report which step/s was/were missing:       []   Call  for emergency medical help       []   Show the autoinjector to the emergency medical responder       E.  Does the subject recall where s/he keeps/ stores the autoinjectors? [x]  YES  []  NO          Note the place of storage and any corrective explanation if needed below __________________________________________________________________ __________________________________________________________________  - TRAINING REFRESHER   Q7:  Is a training refresher needed?      []  YES  [x]  NO        If YES, indicate items that have to be refreshed. More than one may apply:               []   Heart attack symptoms             []   Actions to be taken following heart attack symptoms             []   Steps to perform the self-injection and follow-up actions to be taken   []   Other, Specify  _________________________________________     Form Based on [facility]_CRF_Version 5.0_27Oct2021  [id number], [id number],            and eCRF  Version 5.0-  26-Sep-2020

## 2021-08-15 ENCOUNTER — Telehealth: Payer: Self-pay

## 2021-08-15 LAB — LIPID PANEL
Chol/HDL Ratio: 2.3 ratio (ref 0.0–5.0)
Cholesterol, Total: 104 mg/dL (ref 100–199)
HDL: 45 mg/dL (ref >39)
LDL Chol Calc (NIH): 43 mg/dL (ref 0–99)
Triglycerides: 77 mg/dL (ref 0–149)
VLDL Cholesterol Cal: 16 mg/dL (ref 5–40)

## 2021-08-15 LAB — COMPREHENSIVE METABOLIC PANEL
ALT: 124 IU/L — ABNORMAL HIGH (ref 0–44)
AST: 112 IU/L — ABNORMAL HIGH (ref 0–40)
Albumin/Globulin Ratio: 2 (ref 1.2–2.2)
Albumin: 4.6 g/dL (ref 3.8–4.8)
Alkaline Phosphatase: 83 IU/L (ref 44–121)
BUN/Creatinine Ratio: 17 (ref 10–24)
BUN: 24 mg/dL (ref 8–27)
Bilirubin Total: 0.5 mg/dL (ref 0.0–1.2)
CO2: 21 mmol/L (ref 20–29)
Calcium: 9.5 mg/dL (ref 8.6–10.2)
Chloride: 102 mmol/L (ref 96–106)
Creatinine, Ser: 1.41 mg/dL — ABNORMAL HIGH (ref 0.76–1.27)
Globulin, Total: 2.3 g/dL (ref 1.5–4.5)
Glucose: 92 mg/dL (ref 65–99)
Potassium: 4.9 mmol/L (ref 3.5–5.2)
Sodium: 141 mmol/L (ref 134–144)
Total Protein: 6.9 g/dL (ref 6.0–8.5)
eGFR: 55 mL/min/{1.73_m2} — ABNORMAL LOW (ref >59)

## 2021-08-15 LAB — PRO B NATRIURETIC PEPTIDE: NT-Pro BNP: 2422 pg/mL — ABNORMAL HIGH (ref 0–376)

## 2021-08-15 NOTE — Telephone Encounter (Signed)
-----   Message from Baldo Daub, MD sent at 08/15/2021  9:14 AM EDT ----- Good stable results except for his liver function test. I think we need to stop his statin recheck in 2 weeks we may require an SGLT2 inhibitor Otherwise no changes in treatment

## 2021-08-15 NOTE — Telephone Encounter (Signed)
Left message on patients voicemail to please return our call.   

## 2021-08-16 ENCOUNTER — Telehealth: Payer: Self-pay

## 2021-08-16 DIAGNOSIS — I25119 Atherosclerotic heart disease of native coronary artery with unspecified angina pectoris: Secondary | ICD-10-CM

## 2021-08-16 NOTE — Telephone Encounter (Signed)
Spoke to the patient just now and he let me know that he found the medication I was talking about and will discontinue it now.

## 2021-08-16 NOTE — Telephone Encounter (Signed)
Rue is calling back requesting to go over which medication it was he was advised to stop.

## 2021-08-16 NOTE — Telephone Encounter (Signed)
-----   Message from Brian J Munley, MD sent at 08/15/2021  9:14 AM EDT ----- Good stable results except for his liver function test. I think we need to stop his statin recheck in 2 weeks we may require an SGLT2 inhibitor Otherwise no changes in treatment 

## 2021-08-16 NOTE — Telephone Encounter (Signed)
Spoke with patient regarding results and recommendation.  Patient verbalizes understanding and is agreeable to plan of care. Advised patient to call back with any issues or concerns.  

## 2021-09-03 ENCOUNTER — Other Ambulatory Visit: Payer: Self-pay | Admitting: *Deleted

## 2021-09-03 DIAGNOSIS — I25119 Atherosclerotic heart disease of native coronary artery with unspecified angina pectoris: Secondary | ICD-10-CM

## 2021-09-03 DIAGNOSIS — I110 Hypertensive heart disease with heart failure: Secondary | ICD-10-CM

## 2021-09-03 LAB — COMPREHENSIVE METABOLIC PANEL
ALT: 51 IU/L — ABNORMAL HIGH (ref 0–44)
AST: 42 IU/L — ABNORMAL HIGH (ref 0–40)
Albumin/Globulin Ratio: 1.9 (ref 1.2–2.2)
Albumin: 4.3 g/dL (ref 3.8–4.8)
Alkaline Phosphatase: 80 IU/L (ref 44–121)
BUN/Creatinine Ratio: 16 (ref 10–24)
BUN: 21 mg/dL (ref 8–27)
Bilirubin Total: 0.4 mg/dL (ref 0.0–1.2)
CO2: 19 mmol/L — ABNORMAL LOW (ref 20–29)
Calcium: 9.7 mg/dL (ref 8.6–10.2)
Chloride: 104 mmol/L (ref 96–106)
Creatinine, Ser: 1.33 mg/dL — ABNORMAL HIGH (ref 0.76–1.27)
Globulin, Total: 2.3 g/dL (ref 1.5–4.5)
Glucose: 90 mg/dL (ref 70–99)
Potassium: 4.9 mmol/L (ref 3.5–5.2)
Sodium: 139 mmol/L (ref 134–144)
Total Protein: 6.6 g/dL (ref 6.0–8.5)
eGFR: 59 mL/min/{1.73_m2} — ABNORMAL LOW (ref >59)

## 2021-09-04 ENCOUNTER — Telehealth: Payer: Self-pay

## 2021-09-04 NOTE — Telephone Encounter (Signed)
-----   Message from Baldo Daub, MD sent at 09/04/2021  9:59 AM EDT ----- Regarding: FW: Good result no changes in treatment ----- Message ----- From: Interface, [facility] Lab Results In Sent: 09/03/2021   4:37 PM EDT To: Baldo Daub, MD

## 2021-09-04 NOTE — Telephone Encounter (Signed)
Spoke with patient regarding results and recommendation.  Patient verbalizes understanding and is agreeable to plan of care. Advised patient to call back with any issues or concerns.  

## 2021-09-06 ENCOUNTER — Encounter: Payer: Self-pay | Admitting: *Deleted

## 2021-09-06 DIAGNOSIS — Z006 Encounter for examination for normal comparison and control in clinical research program: Secondary | ICD-10-CM

## 2021-09-06 NOTE — Research (Signed)
Patient's expiring autoinjector kits [id number], [id number]) were exchanged for Kit # [id number] and [id number] which expire in 20-Nov-2022. Patient answered all study questions appropriately. This is not considered an unscheduled office visit per Sponsor CRA, Roxine Caddy.

## 2021-09-08 NOTE — Progress Notes (Signed)
Cardiology Office Note:    Date:  09/09/2021   ID:  Lance Ayala, DOB [birth year], MRN [id number]  PCP:  Anselmo Pickler, MD  Cardiologist:  Norman Herrlich, MD    Referring MD: Anselmo Pickler, *    ASSESSMENT:    1. Chronic systolic heart failure (HCC)   2. Hypertensive heart disease with heart failure (HCC)   3. Coronary artery disease involving native coronary artery of native heart with angina pectoris (HCC)   4. Mixed hyperlipidemia   5. Stage 3a chronic kidney disease (HCC)    PLAN:    In order of problems listed above:  Doing much better he has no fluid overload New York Heart Association class I continue his current therapy including MRA and Entresto carvedilol SGLT2 inhibitor presently not on loop diuretic.  Heart rate is in the 60s continue current dose carvedilol uptitrate Entresto.  Recheck echocardiogram 3 months post ACS make a decision regarding ICD Stable CAD continue current treatment including lipid-lowering dual antiplatelet Continue Zetia no longer on statin Stable CKD recent labs   Next appointment: 2 months   Medication Adjustments/Labs and Tests Ordered: Current medicines are reviewed at length with the patient today.  Concerns regarding medicines are outlined above.  No orders of the defined types were placed in this encounter.  No orders of the defined types were placed in this encounter.   Chief Complaint  Patient presents with   Follow-up   Congestive Heart Failure     History of Present Illness:    Lance Ayala is a 65 y.o. male with a hx of CAD with CABG left bundle branch block, chronic systolic heart failure and hyperlipidemia last seen by me 08/02/2021 after admission with acute inferior ST elevation MI and severe left ventricular dysfunction EF in the range of 25 to 30%.  He is on guideline directed therapy including beta-blocker and Entresto SGLT2 inhibitor and MRA along with his dual antiplatelet and lipid-lowering  treatment.  Compliance with diet, lifestyle and medications: Yes  Is improved he is outdoors walking every day and has had no cardiovascular symptoms of exercise intolerance edema shortness of breath chest pain palpitations or syncope. He is compliant with his medications he has no side effects from Brilinta shortness of breath or Zetia with muscle pain or weakness.  Cardiac testing detailed below Diagnostic Studies/Procedures    Cath: 07/03/21    Ost RCA to Prox RCA Stent is 5% stenosed.   CULPRIT LESION: Prox RCA to Mid RCA lesion is 99% stenosed.   A stent was successfully placed using a SYNERGY XD 2.75X16. Deployed to 3.0 mm   Post intervention, there is a 0% residual stenosis.   LESION #2: Mid RCA lesion is 80% stenosed.   A stent was successfully placed using a SYNERGY XD 2.50X12. Deployed to 2.8 mm   Post intervention, there is a 0% residual stenosis.   -----------------LEFT CORONARY ARTERY -------------------   Mid LM to Ost LAD lesion is 45% stenosed with 95% stenosed side branch in Ost Cx to Prox Cx.   Ost Ramus to Ramus lesion is 99% stenosed.   Ost 1st Mrg to 1st Mrg lesion is 100% stenosed.   Ost LAD to Prox LAD lesion is 50% stenosed. Prox LAD to Mid LAD lesion is 100% stenosed after 1st Diag branch.   Ost 1st Diag to 1st Diag lesion is 55% stenosed.   ----------------------------GRAFTS--------------------------------   Dist Graft to Insertion lesion between Ramus and 1st Mrg  is 100% stenosed.  SVG graft was not visualized due to known occlusion. Origin lesion is 100% stenosed.   Seq SVG- Seq SVG-RI/OM1-OM1/2 graft was visualized by angiography and is normal in caliber. There is no competitive flow; the Sequential limb is occluded.   LIMA graft was visualized by angiography and is normal in caliber.  The graft exhibits no disease.  The flow is not reversed. There is no competitive flow   -------------HEMODYNAMICS-----------------------   There is severe left ventricular  systolic dysfunction.  The left ventricular ejection fraction is 25-35% by visual estimate.   LV end diastolic pressure is severely elevated.   SUMMARY Acute Inferior ST Elevation MI due to subtotal 95% occlusion of the mid RCA with 80% distal RCA stenosis, and flush occlusion of SVG-RPDA; 5% ISR in ostial and proximal RCA stent.  TIMI-3 flow pre-PCI Successful 2 site PCI of the RCA:  TIMI-3 flow post PCI Prox-Mid RCA 95% reduced to 0% (Synergy DES 2.75 mm x  27mm deployed to 3.0 mm); Mid-Distal RCA 80% reduced to 0% (Synergy DES 2.5 mm x 12 mm deployed to 2.8 mm) . Severe LCA CAD with ostial LCx 95% stenosis feeding only a small caliber AV groove LCx; flush occluded high OM1/RI stent, CTO of 2ndOM; proximal LAD long 40% stenosis feeding major Diag 1l branch, the LAD is occluded after Diag branch. 2 out of 4 original grafts Patent: LIMA-LAD, limb 1 ofSVG-OM1/RI with occluded Sequential limb; flush Occluded SVG-rPDA. ACUTE ON CHRONIC COMBINED SYSTOLIC AND DIASTOLIC HEART FAILURE: -- Pre-PCI and diuresis-severely elevated LVEDP of 34 million mercury reduced to 14 million mercury after diuresis -- Severe reduced LVEF of roughly 25% with dense anterior and also now inferior hypokinesis. Diagnostic Dominance: Right     Intervention       Echo: 07/04/21   IMPRESSIONS     1. Left ventricular ejection fraction, by estimation, is 25 to 30%. The  left ventricle has severely decreased function. The left ventricle  demonstrates regional wall motion abnormalities with basal to mid inferior  akinesis, septal severe hypokinesis  with septal-lateral dyssynchrony consistent with LBBB. The left  ventricular internal cavity size was mildly dilated. Left ventricular  diastolic parameters are consistent with Grade I diastolic dysfunction  (impaired relaxation).   2. Right ventricular systolic function is moderately reduced. The right  ventricular size is normal. Tricuspid regurgitation signal is  inadequate  for assessing PA pressure.   3. The aortic valve is tricuspid. Aortic valve regurgitation is not  visualized. No aortic stenosis is present.   4. The inferior vena cava is normal in size with greater than 50%  respiratory variability, suggesting right atrial pressure of 3 mmHg.   5. The mitral valve is normal in structure. No evidence of mitral valve  regurgitation. No evidence of mitral stenosis Past Medical History:  Diagnosis Date   CAD (coronary artery disease)    a. remote PCI. b. CABG x 4 01/2018.   Carotid artery disease (HCC)    a. s/p R CEA 12/2017 - followed by vascular.   Cholelithiasis    Chronic combined systolic and diastolic CHF (congestive heart failure) (HCC)    a. LVEF 35-40% in 10/2017.   COPD (chronic obstructive pulmonary disease) (HCC) 10/21/2017   Dyslipidemia    Hypertension 10/21/2017   LBBB (left bundle branch block)    Lung nodule    a. lung nodules/mediastinal/axillary lymphadenopathy by CT 10/2017.   Myocardial infarction (HCC) 10/21/2017   TIA (transient ischemic attack)    " Mini stroke in  2000"   Wears dentures     Past Surgical History:  Procedure Laterality Date   CORONARY ANGIOPLASTY WITH STENT PLACEMENT     2 stents   CORONARY ARTERY BYPASS GRAFT N/A 01/25/2018   Procedure: CORONARY ARTERY BYPASS GRAFTING (CABG), ON PUMP, TIMES FOUR, USING LEFT INTERNAL MAMMARY ARTERY AND ENDOSCOPICALLY HARVESTED RIGHT GREATER SAPHENOUS VEIN;  Surgeon: Kerin Perna, MD;  Location: [facility] OR;  Service: Open Heart Surgery;  Laterality: N/A;   CORONARY STENT INTERVENTION Right 07/03/2021   Procedure: CORONARY STENT INTERVENTION;  Surgeon: Marykay Lex, MD;  Location: [facility] INVASIVE CV LAB;  Service: Cardiovascular;  Laterality: Right;   ENDARTERECTOMY Right 12/28/2017   Procedure: RIGHT CAROTID ENDARTERECTOMY;  Surgeon: [name], MD;  Location: [facility] OR;  Service: Vascular;  Laterality: Right;   LEFT HEART CATH AND CORONARY ANGIOGRAPHY N/A 10/29/2017    Procedure: LEFT HEART CATH AND CORONARY ANGIOGRAPHY;  Surgeon: Tonny Bollman, MD;  Location: [facility] INVASIVE CV LAB;  Service: Cardiovascular;  Laterality: N/A;   LEFT HEART CATH AND CORONARY ANGIOGRAPHY N/A 07/03/2021   Procedure: LEFT HEART CATH AND CORONARY ANGIOGRAPHY;  Surgeon: Marykay Lex, MD;  Location: [facility] INVASIVE CV LAB;  Service: Cardiovascular;  Laterality: N/A;   MULTIPLE TOOTH EXTRACTIONS     PATCH ANGIOPLASTY Right 12/28/2017   Procedure: WITH  HEMASHIELD  0.3 X 3 IN  USE AS PATCH ANGIOPLASTY;  Surgeon: [name], MD;  Location: [facility] OR;  Service: Vascular;  Laterality: Right;   TEE WITHOUT CARDIOVERSION N/A 01/25/2018   Procedure: TRANSESOPHAGEAL ECHOCARDIOGRAM (TEE);  Surgeon: Donata Clay, Theron Arista, MD;  Location: [facility] OR;  Service: Open Heart Surgery;  Laterality: N/A;    Current Medications: Current Meds  Medication Sig   albuterol (VENTOLIN HFA) 108 (90 Base) MCG/ACT inhaler Inhale 2 puffs into the lungs every 6 (six) hours as needed for shortness of breath.   aspirin EC 81 MG tablet Take 1 tablet (81 mg total) by mouth daily.   BRILINTA 90 MG TABS tablet TAKE 1 TABLET BY MOUTH 2 TIMES DAILY.   carvedilol (COREG) 6.25 MG tablet Take 1 tablet (6.25 mg total) by mouth 2 (two) times daily with a meal.   empagliflozin (JARDIANCE) 10 MG TABS tablet Take 1 tablet (10 mg total) by mouth daily.   ezetimibe (ZETIA) 10 MG tablet Take 1 tablet (10 mg total) by mouth daily.   sacubitril-valsartan (ENTRESTO) 24-26 MG Take 1 tablet by mouth 2 (two) times daily.   spironolactone (ALDACTONE) 25 MG tablet TAKE ONE TABLET BY MOUTH EVERY DAY   Study - SOS-AMI - selatogrel 16 mg/0.5 mL or placebo SQ injection (PI-Christopher) Inject 16 mg into the skin as needed. Inject 0.5 mL subcutaneously in the abdomen or thigh if you experience symptoms of a heart attack. Call 911 immediately and seek emergency medical support.     Allergies:   Patient has no known allergies.   Social History    Socioeconomic History   Marital status: Single    Spouse name: Not on file   Number of children: Not on file   Years of education: Not on file   Highest education level: Not on file  Occupational History   Not on file  Tobacco Use   Smoking status: Former    Packs/day: 1.00    Years: 20.00    Pack years: 20.00    Types: Cigarettes    Quit date: 12/01/1998    Years since quitting: 22.7    Passive exposure: Never  Smokeless tobacco: Never  Vaping Use   Vaping Use: Never used  Substance and Sexual Activity   Alcohol use: No   Drug use: No   Sexual activity: Not on file  Other Topics Concern   Not on file  Social History Narrative   Not on file   Social Determinants of Health   Financial Resource Strain: Not on file  Food Insecurity: Not on file  Transportation Needs: Not on file  Physical Activity: Not on file  Stress: Not on file  Social Connections: Not on file     Family History: The patient's family history includes Heart attack in his father; Hypertension in his mother; Lung disease in his brother; Stroke in his mother. ROS:   Please see the history of present illness.    All other systems reviewed and are negative.  EKGs/Labs/Other Studies Reviewed:    The following studies were reviewed today:    Recent Labs: 07/04/2021: Hemoglobin 14.1; Platelets 200 08/14/2021: NT-Pro BNP 2,422 09/03/2021: ALT 51; BUN 21; Creatinine, Ser 1.33; Potassium 4.9; Sodium 139  Recent Lipid Panel    Component Value Date/Time   CHOL 104 08/14/2021 1347   TRIG 77 08/14/2021 1347   HDL 45 08/14/2021 1347   CHOLHDL 2.3 08/14/2021 1347   CHOLHDL 5.3 07/05/2021 0200   VLDL 38 07/05/2021 0200   LDLCALC 43 08/14/2021 1347    Physical Exam:    VS:  BP 119/75 (BP Location: Right Arm, Patient Position: Sitting, Cuff Size: Normal)   Pulse 66   Ht 5\' 6"  (1.676 m)   Wt 153 lb (69.4 kg)   SpO2 99%   BMI 24.69 kg/m     Wt Readings from Last 3 Encounters:  09/09/21 153 lb  (69.4 kg)  08/02/21 154 lb 0.6 oz (69.9 kg)  07/18/21 156 lb (70.8 kg)     GEN:  Well nourished, well developed in no acute distress HEENT: Normal NECK: No JVD; No carotid bruits LYMPHATICS: No lymphadenopathy CARDIAC: RRR, no murmurs, rubs, gallops RESPIRATORY:  Clear to auscultation without rales, wheezing or rhonchi  ABDOMEN: Soft, non-tender, non-distended MUSCULOSKELETAL:  No edema; No deformity  SKIN: Warm and dry NEUROLOGIC:  Alert and oriented x 3 PSYCHIATRIC:  Normal affect    Signed, 07/20/21, MD  09/09/2021 9:59 AM    [facility]

## 2021-09-09 ENCOUNTER — Other Ambulatory Visit: Payer: Self-pay

## 2021-09-09 ENCOUNTER — Encounter: Payer: Self-pay | Admitting: Cardiology

## 2021-09-09 ENCOUNTER — Ambulatory Visit (INDEPENDENT_AMBULATORY_CARE_PROVIDER_SITE_OTHER): Payer: Medicare Other | Admitting: Cardiology

## 2021-09-09 VITALS — BP 119/75 | HR 66 | Ht 66.0 in | Wt 153.0 lb

## 2021-09-09 DIAGNOSIS — I5022 Chronic systolic (congestive) heart failure: Secondary | ICD-10-CM | POA: Diagnosis not present

## 2021-09-09 DIAGNOSIS — E782 Mixed hyperlipidemia: Secondary | ICD-10-CM | POA: Diagnosis not present

## 2021-09-09 DIAGNOSIS — I25119 Atherosclerotic heart disease of native coronary artery with unspecified angina pectoris: Secondary | ICD-10-CM | POA: Diagnosis not present

## 2021-09-09 DIAGNOSIS — I110 Hypertensive heart disease with heart failure: Secondary | ICD-10-CM

## 2021-09-09 DIAGNOSIS — N1831 Chronic kidney disease, stage 3a: Secondary | ICD-10-CM

## 2021-09-09 MED ORDER — SACUBITRIL-VALSARTAN 49-51 MG PO TABS: 1.0000 | ORAL_TABLET | Freq: Two times a day (BID) | ORAL | 3 refills | Status: DC

## 2021-09-09 NOTE — Patient Instructions (Signed)
Medication Instructions:  Your physician has recommended you make the following change in your medication:  INCREASED: Entresto 49/51 mg take one tablet by mouth twice daily.  *If you need a refill on your cardiac medications before your next appointment, please call your pharmacy*   Lab Work: None If you have labs (blood work) drawn today and your tests are completely normal, you will receive your results only by: MyChart Message (if you have MyChart) OR A paper copy in the mail If you have any lab test that is abnormal or we need to change your treatment, we will call you to review the results.   Testing/Procedures: Your physician has requested that you have an echocardiogram for the first week in November. Echocardiography is a painless test that uses sound waves to create images of your heart. It provides your doctor with information about the size and shape of your heart and how well your heart's chambers and valves are working. This procedure takes approximately one hour. There are no restrictions for this procedure.    Follow-Up: At [facility], you and your health needs are our priority.  As part of our continuing mission to provide you with exceptional heart care, we have created designated Provider Care Teams.  These Care Teams include your primary Cardiologist (physician) and Advanced Practice Providers (APPs -  Physician Assistants and Nurse Practitioners) who all work together to provide you with the care you need, when you need it.  We recommend signing up for the patient portal called "MyChart".  Sign up information is provided on this After Visit Summary.  MyChart is used to connect with patients for Virtual Visits (Telemedicine).  Patients are able to view lab/test results, encounter notes, upcoming appointments, etc.  Non-urgent messages can be sent to your provider as well.   To learn more about what you can do with MyChart, go to [email or web address].    Your next  appointment:   2 month(s)  The format for your next appointment:   In Person  Provider:   Norman Herrlich, MD   Other Instructions

## 2021-09-27 ENCOUNTER — Ambulatory Visit (INDEPENDENT_AMBULATORY_CARE_PROVIDER_SITE_OTHER): Payer: Medicare Other

## 2021-09-27 ENCOUNTER — Other Ambulatory Visit: Payer: Self-pay

## 2021-09-27 DIAGNOSIS — I110 Hypertensive heart disease with heart failure: Secondary | ICD-10-CM

## 2021-09-27 DIAGNOSIS — I5022 Chronic systolic (congestive) heart failure: Secondary | ICD-10-CM | POA: Diagnosis not present

## 2021-09-27 LAB — ECHOCARDIOGRAM COMPLETE
Area-P 1/2: 3.27 cm2
Calc EF: 26.8 %
S' Lateral: 4.7 cm
Single Plane A2C EF: 32.5 %
Single Plane A4C EF: 19.8 %

## 2021-10-01 ENCOUNTER — Telehealth: Payer: Self-pay

## 2021-10-01 DIAGNOSIS — I110 Hypertensive heart disease with heart failure: Secondary | ICD-10-CM

## 2021-10-01 NOTE — Telephone Encounter (Signed)
-----   Message from Baldo Daub, MD sent at 10/01/2021  1:07 PM EDT ----- Unfortunately function remains severely reduced  I would like him to be seen by Dr. Elberta Fortis EP regarding ICD

## 2021-10-01 NOTE — Telephone Encounter (Signed)
Spoke with patient regarding results and recommendation.  Patient verbalizes understanding and is agreeable to plan of care. Advised patient to call back with any issues or concerns.  

## 2021-10-10 ENCOUNTER — Other Ambulatory Visit: Payer: Self-pay | Admitting: Cardiology

## 2021-10-10 NOTE — Telephone Encounter (Signed)
This is a [place] pt °

## 2021-11-05 ENCOUNTER — Other Ambulatory Visit: Payer: Self-pay

## 2021-11-05 DIAGNOSIS — I2510 Atherosclerotic heart disease of native coronary artery without angina pectoris: Secondary | ICD-10-CM | POA: Insufficient documentation

## 2021-11-05 DIAGNOSIS — I779 Disorder of arteries and arterioles, unspecified: Secondary | ICD-10-CM | POA: Insufficient documentation

## 2021-11-06 ENCOUNTER — Encounter: Payer: Self-pay | Admitting: Cardiology

## 2021-11-06 ENCOUNTER — Other Ambulatory Visit: Payer: Self-pay

## 2021-11-06 ENCOUNTER — Ambulatory Visit (INDEPENDENT_AMBULATORY_CARE_PROVIDER_SITE_OTHER): Payer: Medicare Other | Admitting: Cardiology

## 2021-11-06 VITALS — BP 120/70 | HR 73 | Ht 66.0 in | Wt 157.0 lb

## 2021-11-06 DIAGNOSIS — I255 Ischemic cardiomyopathy: Secondary | ICD-10-CM | POA: Diagnosis not present

## 2021-11-06 NOTE — Progress Notes (Signed)
Electrophysiology Office Note   Date:  11/06/2021   ID:  Lance Ayala, DOB 27-Jun-1956, MRN [id number]  PCP:  Anselmo Pickler, MD  Cardiologist:  Dulce Sellar Primary Electrophysiologist:  Tran Randle Jorja Loa, MD    Chief Complaint: CHF   History of Present Illness: Lance Ayala is a 65 y.o. male who is being seen today for the evaluation of CHF at the request of [name], [name], *. Presenting today for electrophysiology evaluation.  He has a history significant coronary artery disease status post CABG, left bundle branch block, chronic systolic heart failure due to ischemic cardiomyopathy, hyperlipidemia.  He was admitted to the hospital August 2022 with acute inferior STEMI and severe LV dysfunction.  He had an RCA stent at that time.  Today, he denies symptoms of palpitations, chest pain, orthopnea, PND, lower extremity edema, claudication, dizziness, presyncope, syncope, bleeding, or neurologic sequela. The patient is tolerating medications without difficulties.  He has symptoms of shortness of breath and fatigue.  He is overall able to do most of his daily activities, though he does have to rest more often.  He is concerned about his lifestyle and having a defibrillator.  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   Past Medical History:  Diagnosis Date   Acute ST elevation myocardial infarction (STEMI) of inferior wall (HCC) 07/03/2021   CAD (coronary artery disease)    a. remote PCI. b. CABG x 4 01/2018.   Cardiomyopathy (HCC) 10/26/2017   Carotid artery disease (HCC)    a. s/p R CEA 12/2017 - followed by vascular.   Carotid stenosis 12/28/2017   Chest pain 10/21/2017   Chest tightness 10/21/2017   Cholelithiasis    Chronic combined systolic and diastolic heart failure (HCC) 10/26/2017   COPD (chronic obstructive pulmonary disease) (HCC) 10/21/2017   Coronary artery disease involving native coronary artery of native heart with angina pectoris (HCC) 10/21/2017   Dyslipidemia     Hypertensive heart disease with heart failure (HCC) 10/21/2017   LBBB (left bundle branch block)    Lung nodule    a. lung nodules/mediastinal/axillary lymphadenopathy by CT 10/2017.   Myocardial infarction (HCC) 10/21/2017   S/P CABG x 4 01/25/2018   ST elevation myocardial infarction (STEMI) of inferior wall, subsequent episode of care (HCC) 07/03/2021   TIA (transient ischemic attack)    " Mini stroke in 2000"   Wears dentures    Past Surgical History:  Procedure Laterality Date   CORONARY ANGIOPLASTY WITH STENT PLACEMENT     2 stents   CORONARY ARTERY BYPASS GRAFT N/A 01/25/2018   Procedure: CORONARY ARTERY BYPASS GRAFTING (CABG), ON PUMP, TIMES FOUR, USING LEFT INTERNAL MAMMARY ARTERY AND ENDOSCOPICALLY HARVESTED RIGHT GREATER SAPHENOUS VEIN;  Surgeon: Kerin Perna, MD;  Location: [facility] OR;  Service: Open Heart Surgery;  Laterality: N/A;   CORONARY STENT INTERVENTION Right 07/03/2021   Procedure: CORONARY STENT INTERVENTION;  Surgeon: Marykay Lex, MD;  Location: [facility] INVASIVE CV LAB;  Service: Cardiovascular;  Laterality: Right;   ENDARTERECTOMY Right 12/28/2017   Procedure: RIGHT CAROTID ENDARTERECTOMY;  Surgeon: [name], MD;  Location: [facility] OR;  Service: Vascular;  Laterality: Right;   LEFT HEART CATH AND CORONARY ANGIOGRAPHY N/A 10/29/2017   Procedure: LEFT HEART CATH AND CORONARY ANGIOGRAPHY;  Surgeon: Tonny Bollman, MD;  Location: [facility] INVASIVE CV LAB;  Service: Cardiovascular;  Laterality: N/A;   LEFT HEART CATH AND CORONARY ANGIOGRAPHY N/A 07/03/2021   Procedure: LEFT HEART CATH AND CORONARY ANGIOGRAPHY;  Surgeon: Bryan Lemma  W, MD;  Location: [facility] INVASIVE CV LAB;  Service: Cardiovascular;  Laterality: N/A;   MULTIPLE TOOTH EXTRACTIONS     PATCH ANGIOPLASTY Right 12/28/2017   Procedure: WITH  HEMASHIELD  0.3 X 3 IN  USE AS PATCH ANGIOPLASTY;  Surgeon: [name], MD;  Location: [facility] OR;  Service: Vascular;  Laterality: Right;   TEE WITHOUT CARDIOVERSION N/A 01/25/2018    Procedure: TRANSESOPHAGEAL ECHOCARDIOGRAM (TEE);  Surgeon: Donata Clay, Theron Arista, MD;  Location: [facility] OR;  Service: Open Heart Surgery;  Laterality: N/A;     Current Outpatient Medications  Medication Sig Dispense Refill   albuterol (VENTOLIN HFA) 108 (90 Base) MCG/ACT inhaler Inhale 2 puffs into the lungs every 6 (six) hours as needed for shortness of breath.     aspirin EC 81 MG tablet Take 1 tablet (81 mg total) by mouth daily. 90 tablet 3   BRILINTA 90 MG TABS tablet TAKE 1 TABLET BY MOUTH 2 TIMES DAILY. 180 tablet 3   carvedilol (COREG) 6.25 MG tablet Take 1 tablet (6.25 mg total) by mouth 2 (two) times daily with a meal. 60 tablet 0   ezetimibe (ZETIA) 10 MG tablet TAKE 1 TABLET BY MOUTH EVERY DAY 30 tablet 0   JARDIANCE 10 MG TABS tablet TAKE 1 TABLET BY MOUTH EVERY DAY 30 tablet 0   sacubitril-valsartan (ENTRESTO) 49-51 MG Take 1 tablet by mouth 2 (two) times daily. 180 tablet 3   spironolactone (ALDACTONE) 25 MG tablet TAKE ONE TABLET BY MOUTH EVERY DAY 90 tablet 3   Study - SOS-AMI - selatogrel 16 mg/0.5 mL or placebo SQ injection (PI-Christopher) Inject 16 mg into the skin as needed. Inject 0.5 mL subcutaneously in the abdomen or thigh if you experience symptoms of a heart attack. Call 911 immediately and seek emergency medical support.     No current facility-administered medications for this visit.    Allergies:   Patient has no known allergies.   Social History:  The patient  reports that he quit smoking about 22 years ago. His smoking use included cigarettes. He has a 20.00 pack-year smoking history. He has never been exposed to tobacco smoke. He has never used smokeless tobacco. He reports that he does not drink alcohol and does not use drugs.   Family History:  The patient's family history includes Heart attack in his father; Hypertension in his mother; Lung disease in his brother; Stroke in his mother.    ROS:  Please see the history of present illness.   Otherwise, review of  systems is positive for none.   All other systems are reviewed and negative.    PHYSICAL EXAM: VS:  BP 120/70 (BP Location: Right Arm, Patient Position: Sitting, Cuff Size: Normal)   Pulse 73   Ht 5\' 6"  (1.676 m)   Wt 157 lb (71.2 kg)   SpO2 97%   BMI 25.34 kg/m  , BMI Body mass index is 25.34 kg/m. GEN: Well nourished, well developed, in no acute distress  HEENT: normal  Neck: no JVD, carotid bruits, or masses Cardiac: RRR; no murmurs, rubs, or gallops,no edema  Respiratory:  clear to auscultation bilaterally, normal work of breathing GI: soft, nontender, nondistended, + BS MS: no deformity or atrophy  Skin: warm and dry Neuro:  Strength and sensation are intact Psych: euthymic mood, full affect  EKG:  EKG is ordered today. Personal review of the ekg ordered shows sinus rhythm, left bundle branch block  Recent Labs: 07/04/2021: Hemoglobin 14.1; Platelets 200 08/14/2021: NT-Pro  BNP 2,422 09/03/2021: ALT 51; BUN 21; Creatinine, Ser 1.33; Potassium 4.9; Sodium 139    Lipid Panel     Component Value Date/Time   CHOL 104 08/14/2021 1347   TRIG 77 08/14/2021 1347   HDL 45 08/14/2021 1347   CHOLHDL 2.3 08/14/2021 1347   CHOLHDL 5.3 07/05/2021 0200   VLDL 38 07/05/2021 0200   LDLCALC 43 08/14/2021 1347     Wt Readings from Last 3 Encounters:  11/06/21 157 lb (71.2 kg)  09/09/21 153 lb (69.4 kg)  08/02/21 154 lb 0.6 oz (69.9 kg)      Other studies Reviewed: Additional studies/ records that were reviewed today include: TTE 09/27/21  Review of the above records today demonstrates:   1. Left ventricular ejection fraction, by estimation, is 25 to 30%. The  left ventricle has severely decreased function. The left ventricle  exhibits dysynchrony. Left ventricular diastolic parameters are consistent  with Grade I diastolic dysfunction  (impaired relaxation).   2. Right ventricular systolic function is normal. The right ventricular  size is normal. There is normal pulmonary  artery systolic pressure.   3. Left atrial size was mildly dilated.   4. The mitral valve is normal in structure. No evidence of mitral valve  regurgitation. No evidence of mitral stenosis.   5. The aortic valve is normal in structure. Aortic valve regurgitation is  not visualized. No aortic stenosis is present.   6. The inferior vena cava is normal in size with greater than 50%  respiratory variability, suggesting right atrial pressure of 3 mmHg.   Left heart catheterization 07/03/2021 Acute Inferior ST Elevation MI due to subtotal 95% occlusion of the mid RCA with 80% distal RCA stenosis, and flush occlusion of SVG-RPDA; 5% ISR in ostial and proximal RCA stent.  TIMI-3 flow pre-PCI Successful 2 site PCI of the RCA:  TIMI-3 flow post PCI Prox-Mid RCA 95% reduced to 0% (Synergy DES 2.75 mm x  32mm deployed to 3.0 mm);  Mid-Distal RCA 80% reduced to 0% (Synergy DES 2.5 mm x 12 mm deployed to 2.8 mm) . Severe LCA CAD with ostial LCx 95% stenosis feeding only a small caliber AV groove LCx;  flush occluded high OM1/RI stent, CTO of 2ndOM;  proximal LAD long 40% stenosis feeding major Diag 1l branch, the LAD is occluded after Diag branch. 2 out of 4 original grafts Patent: LIMA-LAD, limb 1 ofSVG-OM1/RI with occluded Sequential limb; flush Occluded SVG-rPDA. ACUTE ON CHRONIC COMBINED SYSTOLIC AND DIASTOLIC HEART FAILURE: -- Pre-PCI and diuresis-severely elevated LVEDP of 34 million mercury reduced to 14 million mercury after diuresis  -- Severe reduced LVEF of roughly 25% with dense anterior and also now inferior hypokinesis.   ASSESSMENT AND PLAN:  1.  Chronic systolic heart failure due to ischemic cardiomyopathy: Currently on optimal medical therapy with Entresto 49/51 mg twice daily, Coreg 6.25 mg twice daily, Aldactone 25 mg twice daily, Jardiance 10 mg daily.  Unfortunately his ejection fraction remains low.  He has a left bundle branch block.  He would thus benefit from CRT-D implant.  Risk  and benefits of been discussed which were bleeding, tamponade, infection, pneumothorax, lead dislodgment, inappropriate shocks, death. I discussed this further with his family.  He does well and works on cars and thus he would be restricted from doing this which he is concerned about.  Mafalda Mcginniss discuss with his primary cardiologist the possibility of stopping Brilinta and switching to Plavix.  2.  Coronary artery disease: Status post CABG.  Has  had recent intervention to the RCA.  Currently on Brilinta and aspirin.  3.  Hyperlipidemia: Continue Zetia per primary cardiology.  4.  CKD stage IIIa: Has been stable on recent labs  Current medicines are reviewed at length with the patient today.   The patient does not have concerns regarding his medicines.  The following changes were made today:  none  Labs/ tests ordered today include:  Orders Placed This Encounter  Procedures   EKG 12-Lead     Disposition:   FU with Myles Tavella pending decision on ICD  Signed, Hadleigh Felber Jorja Loa, MD  11/06/2021 12:57 PM     [facility] [address] [place] [address] [phone] (office) [phone] (fax)

## 2021-11-06 NOTE — Patient Instructions (Addendum)
Medication Instructions:  Your physician recommends that you continue on your current medications as directed. Please refer to the Current Medication list given to you today.  *If you need a refill on your cardiac medications before your next appointment, please call your pharmacy*   Lab Work: None ordered   Testing/Procedures: None ordered   Follow-Up: At [facility], you and your health needs are our priority.  As part of our continuing mission to provide you with exceptional heart care, we have created designated Provider Care Teams.  These Care Teams include your primary Cardiologist (physician) and Advanced Practice Providers (APPs -  Physician Assistants and Nurse Practitioners) who all work together to provide you with the care you need, when you need it.   Your next appointment:   To be determined.   Please call us in January   The format for your next appointment:   In Person  Provider:   Loman Brooklyn, MD    Thank you for choosing [facility]!!   Dory Horn, RN [phone]   Other Instructions  Cardioverter Defibrillator Implantation An implantable cardioverter defibrillator (ICD) is a device that identifies and corrects abnormal heart rhythms. Cardioverter defibrillator implantation is a surgery to place an ICD under the skin in the chest or abdomen. An ICD has a battery, a small computer (pulse generator), and wires (leads) that go into the heart. The ICD detects and corrects two types of dangerous irregular heart rhythms (arrhythmias): A rapid heart rhythm in the lower chambers of the heart (ventricles). This is called ventricular tachycardia. The ventricles contracting in an uncoordinated way. This is called ventricular fibrillation. There are different types of ICDs, and the electrical signals from the ICD can be programmed differently based on the condition being treated. The electrical signals from the ICD can be low-energy pulses, high-energy  shocks, or a combination of the two. The low-energy pulses are generally used to restore the heartbeat to normal when it is either too slow (bradycardia) or too fast. These pulses are painless. The high-energy shocks are used to treat abnormal rhythms such as ventricular tachycardia or ventricular fibrillation. This shock may feel like a strong jolt in the chest. Your health care provider may recommend an ICD if you have: Had a ventricular arrhythmia in the past. A damaged heart because of a disease or heart condition. A weakened heart muscle from a heart attack or cardiac arrest. A congenital heart defect. Long QT syndrome, which is a disorder of the heart's electrical system. Brugada syndrome, which is a condition that causes a disruption of the heart's normal rhythm. Tell a health care provider about: Any allergies you have. All medicines you are taking, including vitamins, herbs, eye drops, creams, and over-the-counter medicines. Any problems you or family members have had with anesthetic medicines. Any blood disorders you have. Any surgeries you have had. Any medical conditions you have. Whether you are pregnant or may be pregnant. What are the risks? Generally, this is a safe procedure. However, problems may occur, including: Infection. Bleeding. Allergic reactions to medicines used during the procedure. Blood clots. Swelling or bruising. Damage to nearby structures or organs, such as nerves, lungs, blood vessels, or the heart where the ICD leads or pulse generator is implanted. What happens before the procedure? Staying hydrated Follow instructions from your health care provider about hydration, which may include: Up to 2 hours before the procedure - you may continue to drink clear liquids, such as water, clear fruit juice, black coffee, and  plain tea.  Eating and drinking restrictions Follow instructions from your health care provider about eating and drinking, which may  include: 8 hours before the procedure - stop eating heavy meals or foods, such as meat, fried foods, or fatty foods. 6 hours before the procedure - stop eating light meals or foods, such as toast or cereal. 6 hours before the procedure - stop drinking milk or drinks that contain milk. 2 hours before the procedure - stop drinking clear liquids. Medicines Ask your health care provider about: Changing or stopping your regular medicines. This is especially important if you are taking diabetes medicines or blood thinners. Taking medicines such as aspirin and ibuprofen. These medicines can thin your blood. Do not take these medicines unless your health care provider tells you to take them. Taking over-the-counter medicines, vitamins, herbs, and supplements. Tests You may have an exam or testing. These may include: Blood tests. A test to check the electrical signals in your heart (electrocardiogram, ECG). Imaging tests, such as a chest X-ray. Echocardiogram. This is an ultrasound of your heart to evaluate your heart structures and function. An event monitor or Holter monitor to wear at home. General instructions Do not use any products that contain nicotine or tobacco for at least 4 weeks before the procedure. These products include cigarettes, chewing tobacco, and vaping devices, such as e-cigarettes. If you need help quitting, ask your health care provider. Ask your health care provider: How your procedure site will be marked. What steps will be taken to help prevent infection. These may include: Removing hair at the surgery site. Washing skin with a germ-killing soap. Taking antibiotic medicine. You may be asked to shower with a germ-killing soap. Plan to have a responsible adult take you home from the hospital or clinic. What happens during the procedure?  Small monitors will be put on your body. They will be used to check your heart rate, blood pressure, and oxygen level. A pair of sticky  pads (defibrillator pads) may be placed on your back and chest. These pads are able to pace your heart as needed during the procedure. An IV will be inserted into one of your veins. You will be given one or more of the following: A medicine to help you relax (sedative). A medicine to numb the area (local anesthetic). A medicine to make you fall asleep(general anesthetic). A small incision will be made to create a deep pocket under the skin of your chest or abdomen. Leads will be guided through a blood vessel into your heart and attached to your heart muscles. Depending on the ICD, the leads may go into one ventricle, or they may go into both ventricles and into an upper chamber of the heart. An X-ray machine (fluoroscope) will be used to help guide the leads. The other end of the leads will be attached to the pulse generator. The pulse generator will be placed into the pocket under the skin. The ICD will be tested, and your health care provider will program the ICD for the condition being treated. The incision will be closed with stitches (sutures), skin glue, adhesive strips, or staples. A bandage (dressing) will be placed over the incision. The procedure may vary among health care providers and hospitals. What happens after the procedure? Your blood pressure, heart rate, breathing rate, and blood oxygen level will be monitored until you leave the hospital or clinic. Your health care provider will also monitor your ICD to make sure it is working properly. A  chest X-ray will be taken to check that the ICD is in the right place. Do not raise the arm on the side of your procedure higher than your shoulder for as long as told by your health care provider. This is usually at least 6 weeks. You may be given an identification card explaining that you have an ICD. You will be given a remote home monitoring device to use with your ICD to allow your device to communicate with your clinic. Summary An  implantable cardioverter defibrillator (ICD) is a device that identifies and corrects abnormal heart rhythms. Cardioverter defibrillator implantation is a surgery to place an ICD under the skin in the chest or abdomen. An ICD consists of a battery, a small computer (pulse generator), and wires (leads) that go into the heart. During the procedure, the ICD will be tested, and your health care provider will program the ICD for the condition being treated. After the procedure, a chest X-ray will be taken to check that the ICD is in the right place. This information is not intended to replace advice given to you by your health care provider. Make sure you discuss any questions you have with your health care provider. Document Revised: 05/16/2020 Document Reviewed: 05/16/2020 Elsevier Patient Education  2022 [employer]   Biventricular Pacemaker Implantation Biventricular pacemaker implantation is a procedure to place (implant) a pacemaker near the heart. A biventricular pacemaker is a type of pacemaker used in people with heart failure due to weak heart muscles. This procedure may be done to: Treat symptoms of severe heart failure, such as shortness of breath. Correct a heartbeat that may be too slow or irregular. A pacemaker is a small, battery-powered device that helps control the heartbeat. If the heart beats irregularly or too slowly, the pacemaker will pace the heart so that it beats at a normal rate or a programmed rate. A biventricular pacemaker will synchronize the lower chambers of the heart (ventricles) so they contract at the same time. This can help them pump more efficiently. The parts of a biventricular pacemaker include: The pulse generator. The pulse generator contains a small computer that is programmed to keep the heart beating at a certain rate. The pulse generator also produces the electrical signal that triggers the heart to beat. This is implanted under the skin of the upper chest,  near the collarbone. Wires (leads). There may be two or three leads placed in the heart--one to the right atrium, one to the right ventricle, and one through the coronary sinus to reach the left ventricle of the heart. The leads are connected to the pulse generator. They transmit electrical pulses from the pulse generator to the heart. Tell a health care provider about: Any allergies you have. All medicines you are taking, including vitamins, herbs, eye drops, creams, and over-the-counter medicines. Any problems you or family members have had with anesthetic medicines. Any bleeding problems you have. Any surgeries you have had. Any medical conditions you have. Whether you are pregnant or may be pregnant. What are the risks? Generally, this is a safe procedure. However, problems may occur, including: Infection. Swelling, bruising, or bleeding at the pacemaker site, especially if you take blood thinners. Allergic reactions to medicines or dyes. Damage to blood vessels or nerves near the pacemaker. Failure of the pacemaker to improve your condition. Collapsed lung. Blood clots. Lead failures. This may require more surgery. What happens before the procedure? Staying hydrated Follow instructions from your health care provider about hydration, which  may include: Up to 2 hours before the procedure - you may continue to drink clear liquids, such as water, clear fruit juice, black coffee, and plain tea.  Eating and drinking restrictions Follow instructions from your health care provider about eating and drinking, which may include: 8 hours before the procedure - stop eating heavy meals or foods, such as meat, fried foods, or fatty foods. 6 hours before the procedure - stop eating light meals or foods, such as toast or cereal. 6 hours before the procedure - stop drinking milk or drinks that contain milk. 2 hours before the procedure - stop drinking clear liquids. Medicines Ask your health care  provider about: Changing or stopping your regular medicines. This is especially important if you are taking diabetes medicines or blood thinners. Taking medicines such as aspirin and ibuprofen. These medicines can thin your blood. Do not take these medicines unless your health care provider tells you to take them. Taking over-the-counter medicines, vitamins, herbs, and supplements. General instructions Take steps to improve your health and fitness as told. Stopping smoking, eating a healthy diet, and exercising regularly can help speed up your recovery time and reduce the risk of complications. You may have tests, including: Blood tests. Chest X-rays. Echocardiogram. This is a test that uses sound waves (ultrasound) to produce an image of the heart. Ask your health care provider: How your surgery site will be marked. What steps will be taken to help prevent infection. These may include: Removing hair at the surgery site. Washing skin with a germ-killing soap. Taking antibiotic medicine. Plan to have a responsible adult take you home from the hospital or clinic. If you will be going home right after the procedure, plan to have a responsible adult care for you for the time you are told. This is important. What happens during the procedure?  An IV will be inserted into one of your veins. You will be connected to a heart monitor. Large electrode pads will be placed on the front and back of your chest. You will be given one or more of the following: A medicine to help you relax (sedative). A medicine to make you fall asleep (general anesthetic). A medicine that is injected into an area of your body to numb the area (local anesthetic). An incision will be made in your upper chest, near your heart. The leads will be guided into your incision, through your blood vessels, and into your heart. Your health care provider will use an X-ray machine (fluoroscope) to guide the leads into your heart. The  leads will be attached to your heart muscles and to the pulse generator. The leads will be tested to make sure that they work correctly. The pulse generator will be implanted under your skin, near your incision. The heart monitor will be watched to ensure that the pacemaker is working correctly. Your incision will be closed with stitches (sutures), skin glue, or adhesive tape. A bandage (dressing) will be placed over your incision. The procedure may vary among health care providers and hospitals. What happens after the procedure? Your blood pressure, heart rate, breathing rate, and blood oxygen level will be monitored until you leave the hospital or clinic. You may continue to receive fluids and medicines through an IV. You will be given pain medicine as needed. You will have a chest X-ray done. This is to make sure that your pacemaker is in the right place. You will be given a pacemaker identification card. This card lists the implant  date, device model, and manufacturer of your pacemaker. If you were given a sedative during the procedure, it can affect you for several hours. Do not drive or operate machinery until your health care provider says that it is safe. Summary A pacemaker is a small, battery-powered device that helps control the heartbeat. A biventricular pacemaker is used in people with heart failure to get the ventricles of the heart to pump more efficiently. Follow instructions from your health care provider about taking medicines and about eating and drinking before the procedure. You will be given a pacemaker identification card that lists the implant date, device model, and manufacturer of your pacemaker. This information is not intended to replace advice given to you by your health care provider. Make sure you discuss any questions you have with your health care provider. Document Revised: 03/15/2021 Document Reviewed: 03/15/2021 Elsevier Patient Education  2022 [employer].

## 2021-11-08 ENCOUNTER — Other Ambulatory Visit: Payer: Self-pay | Admitting: Cardiology

## 2021-11-10 NOTE — Progress Notes (Signed)
Cardiology Office Note:    Date:  11/11/2021   ID:  Lance Ayala, DOB 1955-12-29, MRN [id number]  PCP:  Anselmo Pickler, MD  Cardiologist:  Norman Herrlich, MD    Referring MD: Anselmo Pickler, *    ASSESSMENT:    1. Hypertensive heart disease with heart failure (HCC)   2. Cardiomyopathy, ischemic   3. Coronary artery disease involving native coronary artery of native heart with angina pectoris (HCC)   4. Mixed hyperlipidemia   5. Stage 3a chronic kidney disease (HCC)    PLAN:    In order of problems listed above:  Stable he has markedly improved with guideline directed therapy emergent uptitrate Entresto with his next prescription to optimal dose continue his other treatment including MRA beta-blocker SGLT2 inhibitor at this time does not need a loop diuretic. Unfortunately severe residual ischemic cardiomyopathy on optimal guideline directed therapy at high risk for sudden death strongly encouraged him to accept ICD. Stable CAD continue medical therapy having no angina New York Heart Association class I including his dual antiplatelet therapy for 1 year Stable continue with statin Recheck renal function proBNP   Next appointment: 3 months   Medication Adjustments/Labs and Tests Ordered: Current medicines are reviewed at length with the patient today.  Concerns regarding medicines are outlined above.  No orders of the defined types were placed in this encounter.  No orders of the defined types were placed in this encounter.   Chief Complaint  Patient presents with   Follow-up   Cardiomyopathy    He has been advised an ICD both by me and EP consultant at high risk for sudden cardiac death CAD previous revascularization severe left ventricular dysfunction on guideline directed medical therapy.   .[facility]hab  History of Present Illness:    Lance Ayala is a 65 y.o. male with a hx of CAD with CABG left bundle branch block chronic systolic heart failure  hyperlipidemia acute inferior ST segment elevation MI 07/03/2021 with PCI and stent mid right coronary artery and subsequent ejection fraction of 25 to 30%.  He was last seen 09/09/2021 heart failure was improved compensated and was on guideline directed therapy including loop diuretic MRI Entresto carvedilol and SGLT2 inhibitor.  Repeat echocardiogram 09/27/2021 showed EF of 25 to 30% with asynchronous contractility and he was referred to EP for consideration of ICD and CRT.  He was advised device therapy and was hesitant and is still considering..  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 No angina edema orthopnea chest pain or syncope. Home blood pressure runs 1 10-1 40 systolic I suspect he will agree to get an ICD he is worried about his employment.  Echo 09/27/2021:  1. Left ventricular ejection fraction, by estimation, is 25 to 30%. The  left ventricle has severely decreased function. The left ventricle  exhibits dysynchrony. Left ventricular diastolic parameters are consistent  with Grade I diastolic dysfunction  (impaired relaxation).   2. Right ventricular systolic function is normal. The right ventricular  size is normal. There is normal pulmonary artery systolic pressure.   3. Left atrial size was mildly dilated.   4. The mitral valve is normal in structure. No evidence of mitral valve  regurgitation. No evidence of mitral stenosis.   5. The aortic valve is normal in structure. Aortic valve regurgitation is  not visualized. No aortic stenosis is present.   6. The inferior vena cava is normal in size with greater than 50%  respiratory variability, suggesting right atrial pressure of 3 mmHg.  Past Medical History:  Diagnosis Date   Acute ST elevation myocardial infarction (STEMI) of inferior wall (HCC)  07/03/2021   CAD (coronary artery disease)    a. remote PCI. b. CABG x 4 01/2018.   Cardiomyopathy (HCC) 10/26/2017   Carotid artery disease (HCC)    a. s/p R CEA 12/2017 - followed by vascular.   Carotid stenosis 12/28/2017   Chest pain 10/21/2017   Chest tightness 10/21/2017   Cholelithiasis    Chronic combined systolic and diastolic heart failure (HCC) 10/26/2017   COPD (chronic obstructive pulmonary disease) (HCC) 10/21/2017   Coronary artery disease involving native coronary artery of native heart with angina pectoris (HCC) 10/21/2017   Dyslipidemia    Hypertensive heart disease with heart failure (HCC) 10/21/2017   LBBB (left bundle branch block)    Lung nodule    a. lung nodules/mediastinal/axillary lymphadenopathy by CT 10/2017.   Myocardial infarction (HCC) 10/21/2017   S/P CABG x 4 01/25/2018   ST elevation myocardial infarction (STEMI) of inferior wall, subsequent episode of care (HCC) 07/03/2021   TIA (transient ischemic attack)    " Mini stroke in 2000"   Wears dentures     Past Surgical History:  Procedure Laterality Date   CORONARY ANGIOPLASTY WITH STENT PLACEMENT     2 stents   CORONARY ARTERY BYPASS GRAFT N/A 01/25/2018   Procedure: CORONARY ARTERY BYPASS GRAFTING (CABG), ON PUMP, TIMES FOUR, USING LEFT INTERNAL MAMMARY ARTERY AND ENDOSCOPICALLY HARVESTED RIGHT GREATER SAPHENOUS VEIN;  Surgeon: Kerin Perna, MD;  Location: [facility] OR;  Service: Open Heart Surgery;  Laterality: N/A;   CORONARY STENT INTERVENTION Right 07/03/2021   Procedure: CORONARY STENT INTERVENTION;  Surgeon: Marykay Lex, MD;  Location: [facility] INVASIVE CV LAB;  Service: Cardiovascular;  Laterality: Right;   ENDARTERECTOMY Right 12/28/2017   Procedure: RIGHT CAROTID ENDARTERECTOMY;  Surgeon: [name], MD;  Location: [facility] OR;  Service: Vascular;  Laterality: Right;   LEFT HEART CATH AND CORONARY ANGIOGRAPHY N/A 10/29/2017   Procedure: LEFT HEART CATH AND CORONARY ANGIOGRAPHY;  Surgeon: Tonny Bollman,  MD;  Location: [facility] INVASIVE CV LAB;  Service: Cardiovascular;  Laterality: N/A;   LEFT HEART CATH AND CORONARY ANGIOGRAPHY N/A 07/03/2021   Procedure: LEFT HEART CATH AND CORONARY ANGIOGRAPHY;  Surgeon: Marykay Lex, MD;  Location: [facility] INVASIVE CV LAB;  Service: Cardiovascular;  Laterality: N/A;   MULTIPLE TOOTH EXTRACTIONS     PATCH ANGIOPLASTY Right 12/28/2017   Procedure: WITH  HEMASHIELD  0.3 X 3 IN  USE AS PATCH ANGIOPLASTY;  Surgeon: [name], MD;  Location: [facility] OR;  Service: Vascular;  Laterality: Right;   TEE WITHOUT CARDIOVERSION N/A 01/25/2018   Procedure: TRANSESOPHAGEAL ECHOCARDIOGRAM (TEE);  Surgeon: Donata Clay, Theron Arista, MD;  Location: [facility] OR;  Service: Open Heart Surgery;  Laterality: N/A;    Current Medications: Current Meds  Medication Sig   albuterol (VENTOLIN HFA) 108 (90 Base) MCG/ACT inhaler Inhale 2 puffs into the lungs every 6 (six) hours as needed for shortness of breath.   aspirin EC 81 MG tablet Take 1 tablet (81 mg total) by mouth daily.   BRILINTA 90 MG TABS tablet TAKE 1 TABLET BY MOUTH 2 TIMES DAILY.   carvedilol (COREG) 6.25 MG tablet Take 1 tablet (6.25 mg total) by mouth 2 (two) times daily  with a meal.   ezetimibe (ZETIA) 10 MG tablet TAKE 1 TABLET BY MOUTH EVERY DAY   JARDIANCE 10 MG TABS tablet TAKE 1 TABLET BY MOUTH EVERY DAY   sacubitril-valsartan (ENTRESTO) 49-51 MG Take 1 tablet by mouth 2 (two) times daily.   spironolactone (ALDACTONE) 25 MG tablet TAKE ONE TABLET BY MOUTH EVERY DAY   Study - SOS-AMI - selatogrel 16 mg/0.5 mL or placebo SQ injection (PI-Christopher) Inject 16 mg into the skin as needed. Inject 0.5 mL subcutaneously in the abdomen or thigh if you experience symptoms of a heart attack. Call 911 immediately and seek emergency medical support.     Allergies:   Patient has no known allergies.   Social History   Socioeconomic History   Marital status: Single    Spouse name: Not on file   Number of children: Not on file   Years of  education: Not on file   Highest education level: Not on file  Occupational History   Not on file  Tobacco Use   Smoking status: Former    Packs/day: 1.00    Years: 20.00    Pack years: 20.00    Types: Cigarettes    Quit date: 12/01/1998    Years since quitting: 22.9    Passive exposure: Never   Smokeless tobacco: Never  Vaping Use   Vaping Use: Never used  Substance and Sexual Activity   Alcohol use: No   Drug use: No   Sexual activity: Not on file  Other Topics Concern   Not on file  Social History Narrative   Not on file   Social Determinants of Health   Financial Resource Strain: Not on file  Food Insecurity: Not on file  Transportation Needs: Not on file  Physical Activity: Not on file  Stress: Not on file  Social Connections: Not on file     Family History: The patient's family history includes Heart attack in his father; Hypertension in his mother; Lung disease in his brother; Stroke in his mother. ROS:   Please see the history of present illness.    All other systems reviewed and are negative.  EKGs/Labs/Other Studies Reviewed:    The following studies were reviewed today:  EKG:  EKG ordered today and personally reviewed.  The ekg ordered today demonstrates   Recent Labs: 07/04/2021: Hemoglobin 14.1; Platelets 200 08/14/2021: NT-Pro BNP 2,422 09/03/2021: ALT 51; BUN 21; Creatinine, Ser 1.33; Potassium 4.9; Sodium 139  Recent Lipid Panel    Component Value Date/Time   CHOL 104 08/14/2021 1347   TRIG 77 08/14/2021 1347   HDL 45 08/14/2021 1347   CHOLHDL 2.3 08/14/2021 1347   CHOLHDL 5.3 07/05/2021 0200   VLDL 38 07/05/2021 0200   LDLCALC 43 08/14/2021 1347    Physical Exam:    VS:  BP 118/62   Pulse 70   Ht 5\' 5"  (1.651 m)   Wt 155 lb 12.8 oz (70.7 kg)   SpO2 95%   BMI 25.93 kg/m     Wt Readings from Last 3 Encounters:  11/11/21 155 lb 12.8 oz (70.7 kg)  11/06/21 157 lb (71.2 kg)  09/09/21 153 lb (69.4 kg)     GEN:  Well nourished, well  developed in no acute distress he does not look chronically ill HEENT: Normal NECK: No JVD; No carotid bruits LYMPHATICS: No lymphadenopathy CARDIAC: RRR, no murmurs, rubs, gallops RESPIRATORY:  Clear to auscultation without rales, wheezing or rhonchi  ABDOMEN: Soft, non-tender, non-distended MUSCULOSKELETAL:  No  edema; No deformity  SKIN: Warm and dry NEUROLOGIC:  Alert and oriented x 3 PSYCHIATRIC:  Normal affect    Signed, Norman Herrlich, MD  11/11/2021 10:25 AM    [facility]

## 2021-11-11 ENCOUNTER — Other Ambulatory Visit: Payer: Self-pay

## 2021-11-11 ENCOUNTER — Ambulatory Visit (INDEPENDENT_AMBULATORY_CARE_PROVIDER_SITE_OTHER): Payer: Medicare Other | Admitting: Cardiology

## 2021-11-11 ENCOUNTER — Institutional Professional Consult (permissible substitution): Payer: Medicare Other | Admitting: Cardiology

## 2021-11-11 ENCOUNTER — Encounter: Payer: Self-pay | Admitting: Cardiology

## 2021-11-11 VITALS — BP 118/62 | HR 70 | Ht 65.0 in | Wt 155.8 lb

## 2021-11-11 DIAGNOSIS — I25119 Atherosclerotic heart disease of native coronary artery with unspecified angina pectoris: Secondary | ICD-10-CM

## 2021-11-11 DIAGNOSIS — E782 Mixed hyperlipidemia: Secondary | ICD-10-CM

## 2021-11-11 DIAGNOSIS — I110 Hypertensive heart disease with heart failure: Secondary | ICD-10-CM

## 2021-11-11 DIAGNOSIS — N1831 Chronic kidney disease, stage 3a: Secondary | ICD-10-CM

## 2021-11-11 DIAGNOSIS — I255 Ischemic cardiomyopathy: Secondary | ICD-10-CM

## 2021-11-11 MED ORDER — SACUBITRIL-VALSARTAN 97-103 MG PO TABS: 1.0000 | ORAL_TABLET | Freq: Two times a day (BID) | ORAL | 12 refills | Status: AC

## 2021-11-11 NOTE — Patient Instructions (Signed)
Medication Instructions:  Your physician has recommended you make the following change in your medication:   Increase your Entresto to 97/103 mg twice daily.  *If you need a refill on your cardiac medications before your next appointment, please call your pharmacy*   Lab Work: Your physician recommends that you have a BMP and ProBNP.  If you have labs (blood work) drawn today and your tests are completely normal, you will receive your results only by: MyChart Message (if you have MyChart) OR A paper copy in the mail If you have any lab test that is abnormal or we need to change your treatment, we will call you to review the results.   Testing/Procedures: None ordered   Follow-Up: At [facility], you and your health needs are our priority.  As part of our continuing mission to provide you with exceptional heart care, we have created designated Provider Care Teams.  These Care Teams include your primary Cardiologist (physician) and Advanced Practice Providers (APPs -  Physician Assistants and Nurse Practitioners) who all work together to provide you with the care you need, when you need it.  We recommend signing up for the patient portal called "MyChart".  Sign up information is provided on this After Visit Summary.  MyChart is used to connect with patients for Virtual Visits (Telemedicine).  Patients are able to view lab/test results, encounter notes, upcoming appointments, etc.  Non-urgent messages can be sent to your provider as well.   To learn more about what you can do with MyChart, go to [email or web address].    Your next appointment:   3 month(s)  The format for your next appointment:   In Person  Provider:   Norman Herrlich, MD   Other Instructions NA

## 2021-11-12 LAB — BASIC METABOLIC PANEL
BUN/Creatinine Ratio: 14 (ref 10–24)
BUN: 16 mg/dL (ref 8–27)
CO2: 21 mmol/L (ref 20–29)
Calcium: 9.3 mg/dL (ref 8.6–10.2)
Chloride: 104 mmol/L (ref 96–106)
Creatinine, Ser: 1.15 mg/dL (ref 0.76–1.27)
Glucose: 89 mg/dL (ref 70–99)
Potassium: 4.6 mmol/L (ref 3.5–5.2)
Sodium: 139 mmol/L (ref 134–144)
eGFR: 71 mL/min/{1.73_m2} (ref >59)

## 2021-11-12 LAB — PRO B NATRIURETIC PEPTIDE: NT-Pro BNP: 2014 pg/mL — ABNORMAL HIGH (ref 0–376)

## 2021-11-13 ENCOUNTER — Telehealth: Payer: Self-pay

## 2021-11-13 NOTE — Telephone Encounter (Signed)
-----   Message from Baldo Daub, MD sent at 11/13/2021  7:40 AM EST ----- Stable resolved no change in medications

## 2021-11-13 NOTE — Telephone Encounter (Signed)
Spoke with patient regarding results and recommendation.  Patient verbalizes understanding and is agreeable to plan of care. Advised patient to call back with any issues or concerns.  

## 2021-11-13 NOTE — Telephone Encounter (Signed)
Patient notified of results and recommendations.

## 2021-11-14 ENCOUNTER — Encounter: Payer: Self-pay | Admitting: *Deleted

## 2021-11-14 DIAGNOSIS — Z006 Encounter for examination for normal comparison and control in clinical research program: Secondary | ICD-10-CM

## 2021-11-15 NOTE — Research (Signed)
[id number], [id number]     FOLLOW-UP PHONE CALLS  SUBJECT ID:  [id number] Trainer's name: Eulogio Ditch, RN Trainer's signature: on Delegation of Authority Log Date of the Follow up call:   14-Nov-2021  Visit Number: 4 Start time of the Follow up call:  0930   End time of call: 0945  Lance Ayala is doing well overall, no CP reported. He has seen the EP doctor, Dr. Elberta Fortis, and his cardiologist, Dr. Dulce Sellar about getting a CRT-D.  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 He knows that he is high risk for a negative outcome if he doesn't get the device. Patient states that he is taking all his medications as directed including his Brilinta and ASA. I confirmed that he has all of my contact information should he have any questions or concerns. Lance Ayala answered all study questions appropriately.   - CONTACT  Q1;  Was the follow up phone call done with the subject?  [x]   YES  []   NO           If NO, check the following:      Q2:    Was the follow up phone call done with a family member               Or caregiver?    []   YES   [x]   NO          Make note about reasons ___________________________________    - MEDICAL CONDITION      Q3:  Did any of the following occur?   []   Death       []   Hospitalization (any cause)    NONE     []   Use of autoinjector  Make note about the type of event, and when it occurred. In case of hospitalization: the Location of the hospital and/or the treating physician's contact details  ____________ ____________________________________________________________________ ____________________________________________________________________  Note: remember to report any SAE/AE which has occurred within 30 days after any  injection of the study drug on relevant eCRF forms.   Q4:  Did the subject develop any condition which is an          exclusion criterion?  []    YES  [x]   NO   Take note about the occurrence of any exclusion criterion after randomization: _________________________________________________________________ __________________________________________________________________  Q5: Was there any change in subject's antithrombotic therapy?   []   YES  [x]   NO     Take not about any change of antithrombotic treatment: _______________________ ____________________________________________________________________ ____________________________________________________________________      OF THE STUDY-SPECFIC TRAINING  Q6: Did the subject correctly reply to the following questions?       A.  What are common heart attack symptoms?      [x]   YES   []   NO      B.  What has to be done in case any of those symptoms occurs? [x]   YES  []   NO      C.  What are the main steps to perform a self-injection?  [x]   YES  []   NO             If NO, then report which step/s was/were missing:        []   Choose injection site (abdomen or thigh)       []   Twist cap off       []   Pinch skin and place the study autoinjector       []   Firmly push down and hold for 3 seconds       D. What has to be done immediately after an injection?  [x]   YES   []   NO          If NO, then report which step/s was/were missing:       []   Call  for emergency medical help       []   Show the autoinjector to the emergency medical responder       E.  Does the subject recall where s/he keeps/ stores the autoinjectors? [x]  YES  []  NO          Note the place of storage and any corrective explanation if needed below __________________________________________________________________ __________________________________________________________________  - TRAINING REFRESHER   Q7:  Is a training refresher needed?      []  YES  [x]  NO        If YES, indicate items that have to be refreshed. More than one may apply:               []   Heart attack symptoms             []    Actions to be taken following heart attack symptoms             []   Steps to perform the self-injection and follow-up actions to be taken   []   Other, Specify  _________________________________________     Form Based on [facility]ORSIA SOS-AMI_CRF_Version 5.0_27Oct2021 [id number], 62,            and eCRF  Version 5.0-  26-Sep-2020   Current Outpatient Medications:    albuterol (VENTOLIN HFA) 108 (90 Base) MCG/ACT inhaler, Inhale 2 puffs into the lungs every 6 (six) hours as needed for shortness of breath., Disp: , Rfl:    aspirin EC 81 MG tablet, Take 1 tablet (81 mg total) by mouth daily., Disp: 90 tablet, Rfl: 3   BRILINTA 90 MG TABS tablet, TAKE 1 TABLET BY MOUTH 2 TIMES DAILY., Disp: 180 tablet, Rfl: 3   carvedilol (COREG) 6.25 MG tablet, Take 1 tablet (6.25 mg total) by mouth 2 (two) times daily with a meal., Disp: 60 tablet, Rfl: 0   ezetimibe (ZETIA) 10 MG tablet, TAKE 1 TABLET BY MOUTH EVERY DAY, Disp: 30 tablet, Rfl: 0   JARDIANCE 10 MG TABS tablet, TAKE 1 TABLET BY MOUTH EVERY DAY, Disp: 30 tablet, Rfl: 0   sacubitril-valsartan (ENTRESTO) 97-103 MG, Take 1 tablet by mouth 2 (two) times daily., Disp: 60 tablet, Rfl: 12   spironolactone (ALDACTONE) 25 MG tablet, TAKE ONE TABLET BY MOUTH EVERY DAY, Disp: 90 tablet, Rfl: 3   Study - SOS-AMI - selatogrel 16 mg/0.5 mL or placebo SQ injection (PI-Christopher), Inject 16 mg into the skin as needed. Inject 0.5 mL subcutaneously in the abdomen or thigh if you experience symptoms of a heart attack. Call 911 immediately and seek emergency medical support., Disp: , Rfl:

## 2022-02-11 ENCOUNTER — Encounter: Payer: Self-pay | Admitting: *Deleted

## 2022-02-11 DIAGNOSIS — Z006 Encounter for examination for normal comparison and control in clinical research program: Secondary | ICD-10-CM

## 2022-02-17 NOTE — Research (Signed)
[id number], SOS-AMI    ? ?FOLLOW-UP PHONE CALLS ? ?SUBJECT ID:  [id number] ?Trainer's name: Eulogio Ditch, RN  ?Trainer's signature: on Delegation of Authority Log ?Date of the Follow up call:     11-Feb-2022 ? ?Visit Number: 5 ?Start time of the Follow up call: 1600      End time of call: 1615 ? ? - CONTACT ? ?Q1;  Was the follow up phone call done with the subject?  [x]   YES  []   NO ?          If NO, check the following:  ? ?   Q2:    Was the follow up phone call done with a family member  ?             Or caregiver?    []   YES   [x]   NO  ?        Make note about reasons ___________________________________   ? ?- MEDICAL CONDITION     ? ?Q3:  Did any of the following occur?   []   Death ?  none    []   Hospitalization (any cause) ?        []   Use of autoinjector ? ?Make note about the type of event, and when it occurred. In case of hospitalization: the ?Location of the hospital and/or the treating physician's contact details  ____________ ?____________________________________________________________________ ?____________________________________________________________________ ? ?Note: remember to report any SAE/AE which has occurred within 30 days after any  ?injection of the study drug on relevant eCRF forms.  ? ?Q4:  Did the subject develop any condition which is an  ?        exclusion criterion?  []   YES  []   NO  ? ?Take note about the occurrence of any exclusion criterion after randomization: ?_________________________________________________________________ ?__________________________________________________________________ ? ?Q5: Was there any change in subject's antithrombotic therapy?   []   YES  [x]   NO    ? ?Take not about any change of antithrombotic treatment: _______________________ ?____________________________________________________________________ ?____________________________________________________________________     ? ?- RECOLLECTION OF THE STUDY-SPECFIC TRAINING ? ?Q6:  Did the subject correctly reply to the following questions? ? ?     A.  What are common heart attack symptoms?      [x]   YES   []   NO ?     B.  What has to be done in case any of those symptoms occurs? [x]   YES  []   NO ?     C.  What are the main steps to perform a self-injection?  [x]   YES  []   NO ? ?           If NO, then report which step/s was/were missing:  ?      []   Choose injection site (abdomen or thigh) ?      []   Twist cap off ?      []   Pinch skin and place the study autoinjector ?      []   Firmly push down and hold for 3 seconds ? ?     D. What has to be done immediately after an injection?  [x]   YES   []   NO ? ?        If NO, then report which step/s was/were missing: ?      []   Call  for emergency medical help ?      []   Show the autoinjector to the emergency medical responder ? ?  E.  Does the subject recall where s/he keeps/ stores the autoinjectors? [x]  YES  []  NO ?         Note the place of storage and any corrective explanation if needed below ?__________________________________________________________________ ?__________________________________________________________________ ? ?- TRAINING REFRESHER  ? ?Q7:  Is a training refresher needed?      []  YES  [x]  NO ?       If YES, indicate items that have to be refreshed. More than one may apply:  ? ?            []   Heart attack symptoms ?            []   Actions to be taken following heart attack symptoms ?            []   Steps to perform the self-injection and follow-up actions to be taken ?  []   Other, Specify  _________________________________________   ? ? ?Form Based on IDORSIA SOS-AMI_CRF_Version 5.0_27Oct2021 [id number], 62,  ?          and eCRF  Version 5.0-  26-Sep-2020 ? ? ?Current Outpatient Medications:  ?  albuterol (VENTOLIN HFA) 108 (90 Base) MCG/ACT inhaler, Inhale 2 puffs into the lungs every 6 (six) hours as needed for shortness of breath., Disp: , Rfl:  ?  aspirin EC 81 MG tablet, Take 1 tablet (81 mg total) by mouth daily., Disp:  90 tablet, Rfl: 3 ?  BRILINTA 90 MG TABS tablet, TAKE 1 TABLET BY MOUTH 2 TIMES DAILY., Disp: 180 tablet, Rfl: 3 ?  carvedilol (COREG) 6.25 MG tablet, Take 1 tablet (6.25 mg total) by mouth 2 (two) times daily with a meal., Disp: 60 tablet, Rfl: 0 ?  ezetimibe (ZETIA) 10 MG tablet, TAKE 1 TABLET BY MOUTH EVERY DAY, Disp: 30 tablet, Rfl: 0 ?  JARDIANCE 10 MG TABS tablet, TAKE 1 TABLET BY MOUTH EVERY DAY, Disp: 30 tablet, Rfl: 0 ?  sacubitril-valsartan (ENTRESTO) 97-103 MG, Take 1 tablet by mouth 2 (two) times daily., Disp: 60 tablet, Rfl: 12 ?  spironolactone (ALDACTONE) 25 MG tablet, TAKE ONE TABLET BY MOUTH EVERY DAY, Disp: 90 tablet, Rfl: 3 ?  Study - SOS-AMI - selatogrel 16 mg/0.5 mL or placebo SQ injection (PI-Christopher), Inject 16 mg into the skin as needed. Inject 0.5 mL subcutaneously in the abdomen or thigh if you experience symptoms of a heart attack. Call 911 immediately and seek emergency medical support., Disp: , Rfl:    ? ? ? ? ? ? ? ? ? ?                ?

## 2022-02-18 ENCOUNTER — Ambulatory Visit: Payer: Medicare Other | Admitting: Cardiology

## 2022-04-24 ENCOUNTER — Encounter: Payer: Self-pay | Admitting: *Deleted

## 2022-04-24 DIAGNOSIS — Z006 Encounter for examination for normal comparison and control in clinical research program: Secondary | ICD-10-CM

## 2022-04-24 NOTE — Research (Signed)
[id number], [facility]     FOLLOW-UP PHONE CALLS  SUBJECT ID:  [id number] Trainer's name: Loletha Carrow Trainer's signature: on Delegation of Authority Log Date of the Follow up call:          [id number]  Visit Number: 006 Start time of the Follow up call:  10:30               End time of call: 10:40   - CONTACT   Q1;  Was the follow up phone call done with the subject?  [x]   YES  []   NO           If NO, check the following:      Q2:    Was the follow up phone call done with a family member               Or caregiver?    []   YES   [x]   NO          Make note about reasons ___________________________________    AUTOINJECTOR LABEL:  Still legible?  [x]   Yes  []   No  - MEDICAL CONDITION      Q3:  Did any of the following occur?   []   Death       []   Hospitalization (any cause) no events         []   Use of autoinjector  Make note about the type of event, and when it occurred. In case of hospitalization: the Location of the hospital and/or the treating physician's contact details  ____________ ____________________________________________________________________ ____________________________________________________________________  Note: remember to report any SAE/AE which has occurred within 30 days after any  injection of the study drug on relevant eCRF forms.   Q4:  Did the subject develop any condition which is an          exclusion criterion?  []   YES  [x]   NO   Take note about the occurrence of any exclusion criterion after randomization: _________________________________________________________________ __________________________________________________________________  Q5: Was there any change in subject's antithrombotic therapy?   []   YES  [x]   NO     Take not about any change of antithrombotic treatment:  _______________________ ____________________________________________________________________ ____________________________________________________________________      OF THE STUDY-SPECFIC TRAINING  Q6: Did the subject correctly reply to the following questions?       A.  What are common heart attack symptoms?      [x]   YES   []   NO      B.  What has to be done in case any of those symptoms occurs? [x]   YES  []   NO      C.  What are the main steps to perform a self-injection?  [x]   YES  []   NO             If NO, then report which step/s was/were missing:        []   Choose injection site (abdomen or thigh)       []   Twist cap off       []   Pinch skin and place the study autoinjector       []   Firmly push down and hold for 3 seconds       D. What has to be done immediately after an injection?  [x]   YES   []   NO  If NO, then report which step/s was/were missing:       []   Call  for emergency medical help       []   Show the autoinjector to the emergency medical responder       E.  Does the subject recall where s/he keeps/ stores the autoinjectors? [x]  YES  []  NO          Note the place of storage and any corrective explanation if needed below __________________________________________________________________ __________________________________________________________________  - TRAINING REFRESHER   Q7:  Is a training refresher needed?      []  YES  [x]  NO        If YES, indicate items that have to be refreshed. More than one may apply:               []   Heart attack symptoms             []   Actions to be taken following heart attack symptoms             []   Steps to perform the self-injection and follow-up actions to be taken   []   Other, Specify  _________________________________________     Form Based on [facility]_CRF_Version 5.0_[id number] [id number], 62,            and eCRF  Version 5.0-  26-Sep-2020     Current Outpatient Medications:    albuterol  (VENTOLIN HFA) 108 (90 Base) MCG/ACT inhaler, Inhale 2 puffs into the lungs every 6 (six) hours as needed for shortness of breath., Disp: , Rfl:    aspirin EC 81 MG tablet, Take 1 tablet (81 mg total) by mouth daily., Disp: 90 tablet, Rfl: 3   BRILINTA 90 MG TABS tablet, TAKE 1 TABLET BY MOUTH 2 TIMES DAILY., Disp: 180 tablet, Rfl: 3   carvedilol (COREG) 6.25 MG tablet, Take 1 tablet (6.25 mg total) by mouth 2 (two) times daily with a meal., Disp: 60 tablet, Rfl: 0   ezetimibe (ZETIA) 10 MG tablet, TAKE 1 TABLET BY MOUTH EVERY DAY, Disp: 30 tablet, Rfl: 0   JARDIANCE 10 MG TABS tablet, TAKE 1 TABLET BY MOUTH EVERY DAY, Disp: 30 tablet, Rfl: 0   sacubitril-valsartan (ENTRESTO) 97-103 MG, Take 1 tablet by mouth 2 (two) times daily., Disp: 60 tablet, Rfl: 12   spironolactone (ALDACTONE) 25 MG tablet, TAKE ONE TABLET BY MOUTH EVERY DAY, Disp: 90 tablet, Rfl: 3   Study - [facility] - selatogrel 16 mg/0.5 mL or placebo SQ injection (PI-Christopher), Inject 16 mg into the skin as needed. Inject 0.5 mL subcutaneously in the abdomen or thigh if you experience symptoms of a heart attack. Call 911 immediately and seek emergency medical support., Disp: , Rfl:    I spoke with patient about changes in consent related to HIPPA. We will not share research study information related to study until the study is complete. This change is made to protect study data and integrity of the study. Patient verbalized undersatnding of the consent changes.

## 2022-05-22 NOTE — Progress Notes (Unsigned)
Cardiology Office Note:    Date:  05/22/2022   ID:  Lance Ayala, DOB 05-30-1956, MRN [id number]  PCP:  Anselmo Pickler, MD  Cardiologist:  Norman Herrlich, MD    Referring MD: Anselmo Pickler, *    ASSESSMENT:    No diagnosis found. PLAN:    In order of problems listed above:  ***   Next appointment: ***   Medication Adjustments/Labs and Tests Ordered: Current medicines are reviewed at length with the patient today.  Concerns regarding medicines are outlined above.  No orders of the defined types were placed in this encounter.  No orders of the defined types were placed in this encounter.   No chief complaint on file.   History of Present Illness:    Lance Ayala is a 66 y.o. male with a hx of CAD with CABG left bundle branch block chronic systolic heart failure hyperlipidemia acute inferior ST segment elevation MI 07/03/2021 with PCI and stent mid right coronary artery and subsequent ejection fraction of 25 to 30%  last seen 11/11/2021. Compliance with diet, lifestyle and medications: *** Past Medical History:  Diagnosis Date   Acute ST elevation myocardial infarction (STEMI) of inferior wall (HCC) 07/03/2021   CAD (coronary artery disease)    a. remote PCI. b. CABG x 4 01/2018.   Cardiomyopathy (HCC) 10/26/2017   Carotid artery disease (HCC)    a. s/p R CEA 12/2017 - followed by vascular.   Carotid stenosis 12/28/2017   Chest pain 10/21/2017   Chest tightness 10/21/2017   Cholelithiasis    Chronic combined systolic and diastolic heart failure (HCC) 10/26/2017   COPD (chronic obstructive pulmonary disease) (HCC) 10/21/2017   Coronary artery disease involving native coronary artery of native heart with angina pectoris (HCC) 10/21/2017   Dyslipidemia    Hypertensive heart disease with heart failure (HCC) 10/21/2017   LBBB (left bundle branch block)    Lung nodule    a. lung nodules/mediastinal/axillary lymphadenopathy by CT 10/2017.   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   ST elevation myocardial infarction (STEMI) of inferior wall, subsequent episode of care (HCC) 07/03/2021   TIA (transient ischemic attack)    " Mini stroke in 2000"   Wears dentures     Past Surgical History:  Procedure Laterality Date   CORONARY ANGIOPLASTY WITH STENT PLACEMENT     2 stents   CORONARY ARTERY BYPASS GRAFT N/A 01/25/2018   Procedure: CORONARY ARTERY BYPASS GRAFTING (CABG), ON PUMP, TIMES FOUR, USING LEFT INTERNAL MAMMARY ARTERY AND ENDOSCOPICALLY HARVESTED RIGHT GREATER SAPHENOUS VEIN;  Surgeon: Kerin Perna, MD;  Location: [facility] OR;  Service: Open Heart Surgery;  Laterality: N/A;   CORONARY STENT INTERVENTION Right 07/03/2021   Procedure: CORONARY STENT INTERVENTION;  Surgeon: Marykay Lex, MD;  Location: [facility] INVASIVE CV LAB;  Service: Cardiovascular;  Laterality: Right;   ENDARTERECTOMY Right 12/28/2017   Procedure: RIGHT CAROTID ENDARTERECTOMY;  Surgeon: [name], MD;  Location: [facility] OR;  Service: Vascular;  Laterality: Right;   LEFT HEART CATH AND CORONARY ANGIOGRAPHY N/A 10/29/2017   Procedure: LEFT HEART CATH AND CORONARY ANGIOGRAPHY;  Surgeon: Tonny Bollman, MD;  Location: [facility] INVASIVE CV LAB;  Service: Cardiovascular;  Laterality: N/A;   LEFT HEART CATH AND CORONARY ANGIOGRAPHY N/A 07/03/2021   Procedure: LEFT HEART CATH AND CORONARY ANGIOGRAPHY;  Surgeon: Marykay Lex, MD;  Location: [facility] INVASIVE CV LAB;  Service: Cardiovascular;  Laterality: N/A;   MULTIPLE TOOTH EXTRACTIONS  PATCH ANGIOPLASTY Right 12/28/2017   Procedure: WITH  HEMASHIELD  0.3 X 3 IN  USE AS PATCH ANGIOPLASTY;  Surgeon: [name], MD;  Location: [facility] OR;  Service: Vascular;  Laterality: Right;   TEE WITHOUT CARDIOVERSION N/A 01/25/2018   Procedure: TRANSESOPHAGEAL ECHOCARDIOGRAM (TEE);  Surgeon: Donata Clay, Theron Arista, MD;  Location: [facility] OR;  Service: Open Heart Surgery;  Laterality: N/A;    Current Medications: No outpatient medications  have been marked as taking for the 05/23/22 encounter (Appointment) with Baldo Daub, MD.     Allergies:   Patient has no known allergies.   Social History   Socioeconomic History   Marital status: Single    Spouse name: Not on file   Number of children: Not on file   Years of education: Not on file   Highest education level: Not on file  Occupational History   Not on file  Tobacco Use   Smoking status: Former    Packs/day: 1.00    Years: 20.00    Total pack years: 20.00    Types: Cigarettes    Quit date: 12/01/1998    Years since quitting: 23.4    Passive exposure: Never   Smokeless tobacco: Never  Vaping Use   Vaping Use: Never used  Substance and Sexual Activity   Alcohol use: No   Drug use: No   Sexual activity: Not on file  Other Topics Concern   Not on file  Social History Narrative   Not on file   Social Determinants of Health   Financial Resource Strain: Not on file  Food Insecurity: Not on file  Transportation Needs: Not on file  Physical Activity: Not on file  Stress: Not on file  Social Connections: Not on file     Family History: The patient's ***family history includes Heart attack in his father; Hypertension in his mother; Lung disease in his brother; Stroke in his mother. ROS:   Please see the history of present illness.    All other systems reviewed and are negative.  EKGs/Labs/Other Studies Reviewed:    The following studies were reviewed today:  EKG:  EKG ordered today and personally reviewed.  The ekg ordered today demonstrates ***  Recent Labs: 07/04/2021: Hemoglobin 14.1; Platelets 200 09/03/2021: ALT 51 11/11/2021: BUN 16; Creatinine, Ser 1.15; NT-Pro BNP 2,014; Potassium 4.6; Sodium 139  Recent Lipid Panel    Component Value Date/Time   CHOL 104 08/14/2021 1347   TRIG 77 08/14/2021 1347   HDL 45 08/14/2021 1347   CHOLHDL 2.3 08/14/2021 1347   CHOLHDL 5.3 07/05/2021 0200   VLDL 38 07/05/2021 0200   LDLCALC 43 08/14/2021 1347     Physical Exam:    VS:  There were no vitals taken for this visit.    Wt Readings from Last 3 Encounters:  11/11/21 155 lb 12.8 oz (70.7 kg)  11/06/21 157 lb (71.2 kg)  09/09/21 153 lb (69.4 kg)     GEN: *** Well nourished, well developed in no acute distress HEENT: Normal NECK: No JVD; No carotid bruits LYMPHATICS: No lymphadenopathy CARDIAC: ***RRR, no murmurs, rubs, gallops RESPIRATORY:  Clear to auscultation without rales, wheezing or rhonchi  ABDOMEN: Soft, non-tender, non-distended MUSCULOSKELETAL:  No edema; No deformity  SKIN: Warm and dry NEUROLOGIC:  Alert and oriented x 3 PSYCHIATRIC:  Normal affect    Signed, Norman Herrlich, MD  05/22/2022 6:09 PM     [facility]

## 2022-05-23 ENCOUNTER — Encounter: Payer: Self-pay | Admitting: Cardiology

## 2022-05-23 ENCOUNTER — Ambulatory Visit (INDEPENDENT_AMBULATORY_CARE_PROVIDER_SITE_OTHER): Payer: Medicare Other | Admitting: Cardiology

## 2022-05-23 VITALS — BP 98/66 | HR 78 | Ht 64.0 in | Wt 150.8 lb

## 2022-05-23 DIAGNOSIS — N1831 Chronic kidney disease, stage 3a: Secondary | ICD-10-CM

## 2022-05-23 DIAGNOSIS — I110 Hypertensive heart disease with heart failure: Secondary | ICD-10-CM

## 2022-05-23 DIAGNOSIS — I255 Ischemic cardiomyopathy: Secondary | ICD-10-CM

## 2022-05-23 DIAGNOSIS — E782 Mixed hyperlipidemia: Secondary | ICD-10-CM

## 2022-05-23 DIAGNOSIS — I25119 Atherosclerotic heart disease of native coronary artery with unspecified angina pectoris: Secondary | ICD-10-CM

## 2022-05-23 MED ORDER — CLOPIDOGREL BISULFATE 75 MG PO TABS: 75.0000 mg | ORAL_TABLET | Freq: Every day | ORAL | 3 refills | Status: AC

## 2022-07-07 ENCOUNTER — Encounter: Payer: Self-pay | Admitting: Cardiology

## 2022-07-07 ENCOUNTER — Ambulatory Visit (INDEPENDENT_AMBULATORY_CARE_PROVIDER_SITE_OTHER): Payer: Medicare Other | Admitting: Cardiology

## 2022-07-07 VITALS — BP 138/78 | HR 68 | Ht 66.0 in | Wt 156.2 lb

## 2022-07-07 DIAGNOSIS — I5022 Chronic systolic (congestive) heart failure: Secondary | ICD-10-CM

## 2022-07-07 DIAGNOSIS — I429 Cardiomyopathy, unspecified: Secondary | ICD-10-CM

## 2022-07-07 NOTE — Patient Instructions (Addendum)
Medication Instructions:  Your physician recommends that you continue on your current medications as directed. Please refer to the Current Medication list given to you today.  *If you need a refill on your cardiac medications before your next appointment, please call your pharmacy*   Lab Work: BNP today  CBC and BMET to be scheduled prior to surgery  If you have labs (blood work) drawn today and your tests are completely normal, you will receive your results only by: MyChart Message (if you have MyChart) OR A paper copy in the mail If you have any lab test that is abnormal or we need to change your treatment, we will call you to review the results.   Testing/Procedures:  Your physician has requested that you have an echocardiogram. Echocardiography is a painless test that uses sound waves to create images of your heart. It provides your doctor with information about the size and shape of your heart and how well your heart's chambers and valves are working. This procedure takes approximately one hour. There are no restrictions for this procedure.  Your physician has recommended that you have a defibrillator inserted. An implantable cardioverter defibrillator (ICD) is a small device that is placed in your chest or, in rare cases, your abdomen. This device uses electrical pulses or shocks to help control life-threatening, irregular heartbeats that could lead the heart to suddenly stop beating (sudden cardiac arrest). Leads are attached to the ICD that goes into your heart. This is done in the hospital and usually requires an overnight stay. Please see the instruction sheet given to you today for more information. Cardioverter Defibrillator Implantation An implantable cardioverter defibrillator (ICD) is a device that identifies and corrects abnormal heart rhythms. Cardioverter defibrillator implantation is a surgery to place an ICD under the skin in the chest or abdomen. An ICD has a battery, a  small computer (pulse generator), and wires (leads) that go into the heart. The ICD detects and corrects two types of dangerous irregular heart rhythms (arrhythmias): A rapid heart rhythm in the lower chambers of the heart (ventricles). This is called ventricular tachycardia. The ventricles contracting in an uncoordinated way. This is called ventricular fibrillation. There are different types of ICDs, and the electrical signals from the ICD can be programmed differently based on the condition being treated. The electrical signals from the ICD can be low-energy pulses, high-energy shocks, or a combination of the two. The low-energy pulses are generally used to restore the heartbeat to normal when it is either too slow (bradycardia) or too fast. These pulses are painless. The high-energy shocks are used to treat abnormal rhythms such as ventricular tachycardia or ventricular fibrillation. This shock may feel like a strong jolt in the chest. Your health care provider may recommend an ICD if you have: Had a ventricular arrhythmia in the past. A damaged heart because of a disease or heart condition. A weakened heart muscle from a heart attack or cardiac arrest. A congenital heart defect. Long QT syndrome, which is a disorder of the heart's electrical system. Brugada syndrome, which is a condition that causes a disruption of the heart's normal rhythm. Tell a health care provider about: Any allergies you have. All medicines you are taking, including vitamins, herbs, eye drops, creams, and over-the-counter medicines. Any problems you or family members have had with anesthetic medicines. Any blood disorders you have. Any surgeries you have had. Any medical conditions you have. Whether you are pregnant or may be pregnant. What are the risks? Generally,  this is a safe procedure. However, problems may occur, including: Infection. Bleeding. Allergic reactions to medicines used during the procedure. Blood  clots. Swelling or bruising. Damage to nearby structures or organs, such as nerves, lungs, blood vessels, or the heart where the ICD leads or pulse generator is implanted. What happens before the procedure? Staying hydrated Follow instructions from your health care provider about hydration, which may include: Up to 2 hours before the procedure - you may continue to drink clear liquids, such as water, clear fruit juice, black coffee, and plain tea.  Eating and drinking restrictions Follow instructions from your health care provider about eating and drinking, which may include: 8 hours before the procedure - stop eating heavy meals or foods, such as meat, fried foods, or fatty foods. 6 hours before the procedure - stop eating light meals or foods, such as toast or cereal. 6 hours before the procedure - stop drinking milk or drinks that contain milk. 2 hours before the procedure - stop drinking clear liquids. Medicines Ask your health care provider about: Changing or stopping your regular medicines. This is especially important if you are taking diabetes medicines or blood thinners. Taking medicines such as aspirin and ibuprofen. These medicines can thin your blood. Do not take these medicines unless your health care provider tells you to take them. Taking over-the-counter medicines, vitamins, herbs, and supplements. Tests You may have an exam or testing. These may include: Blood tests. A test to check the electrical signals in your heart (electrocardiogram, ECG). Imaging tests, such as a chest X-ray. Echocardiogram. This is an ultrasound of your heart to evaluate your heart structures and function. An event monitor or Holter monitor to wear at home. General instructions Do not use any products that contain nicotine or tobacco for at least 4 weeks before the procedure. These products include cigarettes, chewing tobacco, and vaping devices, such as e-cigarettes. If you need help quitting, ask  your health care provider. Ask your health care provider: How your procedure site will be marked. What steps will be taken to help prevent infection. These may include: Removing hair at the surgery site. Washing skin with a germ-killing soap. Taking antibiotic medicine. You may be asked to shower with a germ-killing soap. Plan to have a responsible adult take you home from the hospital or clinic. What happens during the procedure?  Small monitors will be put on your body. They will be used to check your heart rate, blood pressure, and oxygen level. A pair of sticky pads (defibrillator pads) may be placed on your back and chest. These pads are able to pace your heart as needed during the procedure. An IV will be inserted into one of your veins. You will be given one or more of the following: A medicine to help you relax (sedative). A medicine to numb the area (local anesthetic). A medicine to make you fall asleep(general anesthetic). A small incision will be made to create a deep pocket under the skin of your chest or abdomen. Leads will be guided through a blood vessel into your heart and attached to your heart muscles. Depending on the ICD, the leads may go into one ventricle, or they may go into both ventricles and into an upper chamber of the heart. An X-ray machine (fluoroscope) will be used to help guide the leads. The other end of the leads will be attached to the pulse generator. The pulse generator will be placed into the pocket under the skin. The ICD will  be tested, and your health care provider will program the ICD for the condition being treated. The incision will be closed with stitches (sutures), skin glue, adhesive strips, or staples. A bandage (dressing) will be placed over the incision. The procedure may vary among health care providers and hospitals. What happens after the procedure? Your blood pressure, heart rate, breathing rate, and blood oxygen level will be monitored  until you leave the hospital or clinic. Your health care provider will also monitor your ICD to make sure it is working properly. A chest X-ray will be taken to check that the ICD is in the right place. Do not raise the arm on the side of your procedure higher than your shoulder for as long as told by your health care provider. This is usually at least 6 weeks. You may be given an identification card explaining that you have an ICD. You will be given a remote home monitoring device to use with your ICD to allow your device to communicate with your clinic. Summary An implantable cardioverter defibrillator (ICD) is a device that identifies and corrects abnormal heart rhythms. Cardioverter defibrillator implantation is a surgery to place an ICD under the skin in the chest or abdomen. An ICD consists of a battery, a small computer (pulse generator), and wires (leads) that go into the heart. During the procedure, the ICD will be tested, and your health care provider will program the ICD for the condition being treated. After the procedure, a chest X-ray will be taken to check that the ICD is in the right place. This information is not intended to replace advice given to you by your health care provider. Make sure you discuss any questions you have with your health care provider. Document Revised: 05/16/2020 Document Reviewed: 05/16/2020 Elsevier Patient Education  2023 [employer]    Follow-Up: At [facility], you and your health needs are our priority.  As part of our continuing mission to provide you with exceptional heart care, we have created designated Provider Care Teams.  These Care Teams include your primary Cardiologist (physician) and Advanced Practice Providers (APPs -  Physician Assistants and Nurse Practitioners) who all work together to provide you with the care you need, when you need it.  We recommend signing up for the patient portal called "MyChart".  Sign up information is  provided on this After Visit Summary.  MyChart is used to connect with patients for Virtual Visits (Telemedicine).  Patients are able to view lab/test results, encounter notes, upcoming appointments, etc.  Non-urgent messages can be sent to your provider as well.   To learn more about what you can do with MyChart, go to [email or web address].    Your next appointment:   Your device implantation date is to be determined for the month of December - Dr Elberta Fortis nurse, Sherri will contact you with appointment date and time.  You have been provided with your surgical scrub and Preparing for Surgery Instruction letter today    Important Information About Sugar

## 2022-07-07 NOTE — Progress Notes (Signed)
Electrophysiology Office Note   Date:  07/07/2022   ID:  Lance Ayala, DOB 20-May-1956, MRN [id number]  PCP:  Anselmo Pickler, MD  Cardiologist:  Dulce Sellar Primary Electrophysiologist:  Bennett Ram Jorja Loa, MD    Chief Complaint: CHF   History of Present Illness: Lance Ayala is a 66 y.o. male who is being seen today for the evaluation of CHF at the request of [name], [name], *. Presenting today for electrophysiology evaluation.  He has a history significant for coronary artery disease status post CABG, left bundle branch block, chronic systolic heart failure due to ischemic cardiomyopathy, hyperlipidemia.  He was mid to the hospital August 2022 with acute inferior STEMI and severe LV dysfunction.  He had an RCA stent at that time  Today, denies symptoms of palpitations, chest pain,  orthopnea, PND, lower extremity edema, claudication, dizziness, presyncope, syncope, bleeding, or neurologic sequela. The patient is tolerating medications without difficulties.  His main complaint today is shortness of breath with exertion.  He feel as well at rest.  He is short of breath when he walks, mainly up hills or up stairs.  He has no associated chest pain.   Past Medical History:  Diagnosis Date   Acute ST elevation myocardial infarction (STEMI) of inferior wall (HCC) 07/03/2021   CAD (coronary artery disease)    a. remote PCI. b. CABG x 4 01/2018.   Cardiomyopathy (HCC) 10/26/2017   Carotid artery disease (HCC)    a. s/p R CEA 12/2017 - followed by vascular.   Carotid stenosis 12/28/2017   Chest pain 10/21/2017   Chest tightness 10/21/2017   Cholelithiasis    Chronic combined systolic and diastolic heart failure (HCC) 10/26/2017   COPD (chronic obstructive pulmonary disease) (HCC) 10/21/2017   Coronary artery disease involving native coronary artery of native heart with angina pectoris (HCC) 10/21/2017   Dyslipidemia    Hypertensive heart disease with heart failure (HCC) 10/21/2017    LBBB (left bundle branch block)    Lung nodule    a. lung nodules/mediastinal/axillary lymphadenopathy by CT 10/2017.   Myocardial infarction (HCC) 10/21/2017   S/P CABG x 4 01/25/2018   ST elevation myocardial infarction (STEMI) of inferior wall, subsequent episode of care (HCC) 07/03/2021   TIA (transient ischemic attack)    " Mini stroke in 2000"   Wears dentures    Past Surgical History:  Procedure Laterality Date   CORONARY ANGIOPLASTY WITH STENT PLACEMENT     2 stents   CORONARY ARTERY BYPASS GRAFT N/A 01/25/2018   Procedure: CORONARY ARTERY BYPASS GRAFTING (CABG), ON PUMP, TIMES FOUR, USING LEFT INTERNAL MAMMARY ARTERY AND ENDOSCOPICALLY HARVESTED RIGHT GREATER SAPHENOUS VEIN;  Surgeon: Kerin Perna, MD;  Location: [facility] OR;  Service: Open Heart Surgery;  Laterality: N/A;   CORONARY STENT INTERVENTION Right 07/03/2021   Procedure: CORONARY STENT INTERVENTION;  Surgeon: Marykay Lex, MD;  Location: [facility] INVASIVE CV LAB;  Service: Cardiovascular;  Laterality: Right;   ENDARTERECTOMY Right 12/28/2017   Procedure: RIGHT CAROTID ENDARTERECTOMY;  Surgeon: [name], MD;  Location: [facility] OR;  Service: Vascular;  Laterality: Right;   LEFT HEART CATH AND CORONARY ANGIOGRAPHY N/A 10/29/2017   Procedure: LEFT HEART CATH AND CORONARY ANGIOGRAPHY;  Surgeon: Tonny Bollman, MD;  Location: [facility] INVASIVE CV LAB;  Service: Cardiovascular;  Laterality: N/A;   LEFT HEART CATH AND CORONARY ANGIOGRAPHY N/A 07/03/2021   Procedure: LEFT HEART CATH AND CORONARY ANGIOGRAPHY;  Surgeon: Marykay Lex, MD;  Location: [facility] INVASIVE CV LAB;  Service: Cardiovascular;  Laterality: N/A;   MULTIPLE TOOTH EXTRACTIONS     PATCH ANGIOPLASTY Right 12/28/2017   Procedure: WITH  HEMASHIELD  0.3 X 3 IN  USE AS PATCH ANGIOPLASTY;  Surgeon: [name], MD;  Location: [facility] OR;  Service: Vascular;  Laterality: Right;   TEE WITHOUT CARDIOVERSION N/A 01/25/2018   Procedure: TRANSESOPHAGEAL ECHOCARDIOGRAM (TEE);  Surgeon: Donata Clay, Theron Arista, MD;  Location: [facility] OR;  Service: Open Heart Surgery;  Laterality: N/A;     Current Outpatient Medications  Medication Sig Dispense Refill   albuterol (VENTOLIN HFA) 108 (90 Base) MCG/ACT inhaler Inhale 2 puffs into the lungs every 6 (six) hours as needed for shortness of breath.     aspirin EC 81 MG tablet Take 1 tablet (81 mg total) by mouth daily. 90 tablet 3   carvedilol (COREG) 6.25 MG tablet Take 1 tablet (6.25 mg total) by mouth 2 (two) times daily with a meal. 60 tablet 0   clopidogrel (PLAVIX) 75 MG tablet Take 1 tablet (75 mg total) by mouth daily. 90 tablet 3   ezetimibe (ZETIA) 10 MG tablet TAKE 1 TABLET BY MOUTH EVERY DAY (Patient taking differently: Take 10 mg by mouth daily.) 30 tablet 0   JARDIANCE 10 MG TABS tablet TAKE 1 TABLET BY MOUTH EVERY DAY (Patient taking differently: Take 10 mg by mouth daily.) 30 tablet 0   sacubitril-valsartan (ENTRESTO) 97-103 MG Take 1 tablet by mouth 2 (two) times daily. 60 tablet 12   spironolactone (ALDACTONE) 25 MG tablet TAKE ONE TABLET BY MOUTH EVERY DAY (Patient taking differently: Take 25 mg by mouth daily.) 90 tablet 3   Study - SOS-AMI - selatogrel 16 mg/0.5 mL or placebo SQ injection (PI-Christopher) Inject 16 mg into the skin as needed. Inject 0.5 mL subcutaneously in the abdomen or thigh if you experience symptoms of a heart attack. Call 911 immediately and seek emergency medical support.     No current facility-administered medications for this visit.    Allergies:   Patient has no known allergies.   Social History:  The patient  reports that he quit smoking about 23 years ago. His smoking use included cigarettes. He has a 20.00 pack-year smoking history. He has never been exposed to tobacco smoke. He has never used smokeless tobacco. He reports that he does not drink alcohol and does not use drugs.   Family History:  The patient's family history includes Heart attack in his father; Hypertension in his mother; Lung  disease in his brother; Stroke in his mother.   ROS:  Please see the history of present illness.   Otherwise, review of systems is positive for none.   All other systems are reviewed and negative.   PHYSICAL EXAM: VS:  BP 138/78   Pulse 68   Ht 5\' 6"  (1.676 m)   Wt 156 lb 3.2 oz (70.9 kg)   SpO2 95%   BMI 25.21 kg/m  , BMI Body mass index is 25.21 kg/m. GEN: Well nourished, well developed, in no acute distress  HEENT: normal  Neck: no JVD, carotid bruits, or masses Cardiac: RRR; no murmurs, rubs, or gallops,no edema  Respiratory:  clear to auscultation bilaterally, normal work of breathing GI: soft, nontender, nondistended, + BS MS: no deformity or atrophy  Skin: warm and dry Neuro:  Strength and sensation are intact Psych: euthymic mood, full affect  EKG:  EKG is ordered today. Personal review of the ekg ordered shows Ennis rhythm, IVCD  Recent Labs: 09/03/2021:  ALT 51 11/11/2021: BUN 16; Creatinine, Ser 1.15; NT-Pro BNP 2,014; Potassium 4.6; Sodium 139    Lipid Panel     Component Value Date/Time   CHOL 104 08/14/2021 1347   TRIG 77 08/14/2021 1347   HDL 45 08/14/2021 1347   CHOLHDL 2.3 08/14/2021 1347   CHOLHDL 5.3 07/05/2021 0200   VLDL 38 07/05/2021 0200   LDLCALC 43 08/14/2021 1347     Wt Readings from Last 3 Encounters:  07/07/22 156 lb 3.2 oz (70.9 kg)  05/23/22 150 lb 12.8 oz (68.4 kg)  11/11/21 155 lb 12.8 oz (70.7 kg)      Other studies Reviewed: Additional studies/ records that were reviewed today include: TTE 09/27/21  Review of the above records today demonstrates:   1. Left ventricular ejection fraction, by estimation, is 25 to 30%. The  left ventricle has severely decreased function. The left ventricle  exhibits dysynchrony. Left ventricular diastolic parameters are consistent  with Grade I diastolic dysfunction  (impaired relaxation).   2. Right ventricular systolic function is normal. The right ventricular  size is normal. There is normal  pulmonary artery systolic pressure.   3. Left atrial size was mildly dilated.   4. The mitral valve is normal in structure. No evidence of mitral valve  regurgitation. No evidence of mitral stenosis.   5. The aortic valve is normal in structure. Aortic valve regurgitation is  not visualized. No aortic stenosis is present.   6. The inferior vena cava is normal in size with greater than 50%  respiratory variability, suggesting right atrial pressure of 3 mmHg.   Left heart catheterization 07/03/2021 Acute Inferior ST Elevation MI due to subtotal 95% occlusion of the mid RCA with 80% distal RCA stenosis, and flush occlusion of SVG-RPDA; 5% ISR in ostial and proximal RCA stent.  TIMI-3 flow pre-PCI Successful 2 site PCI of the RCA:  TIMI-3 flow post PCI Prox-Mid RCA 95% reduced to 0% (Synergy DES 2.75 mm x  70mm deployed to 3.0 mm);  Mid-Distal RCA 80% reduced to 0% (Synergy DES 2.5 mm x 12 mm deployed to 2.8 mm) . Severe LCA CAD with ostial LCx 95% stenosis feeding only a small caliber AV groove LCx;  flush occluded high OM1/RI stent, CTO of 2ndOM;  proximal LAD long 40% stenosis feeding major Diag 1l branch, the LAD is occluded after Diag branch. 2 out of 4 original grafts Patent: LIMA-LAD, limb 1 ofSVG-OM1/RI with occluded Sequential limb; flush Occluded SVG-rPDA. ACUTE ON CHRONIC COMBINED SYSTOLIC AND DIASTOLIC HEART FAILURE: -- Pre-PCI and diuresis-severely elevated LVEDP of 34 million mercury reduced to 14 million mercury after diuresis  -- Severe reduced LVEF of roughly 25% with dense anterior and also now inferior hypokinesis.   ASSESSMENT AND PLAN:  1.  Chronic Solik heart failure due to ischemic cardiomyopathy: Currently on optimal medical therapy.  His ejection fraction remains low.  IVCD with a left bundle branch block morphology.  He would benefit from CRT-D implant.  Risks and benefits of discussed risk of bleeding, tamponade, infection, pneumothorax, lead dislodgment, inappropriate  shocks.  He understands these risks and is agreed to the procedure.  I Victorina Kable discuss with his primary cardiologist whether or not we can hold his Plavix prior to implantation.  Sherrin Stahle also plan to update his echo to ensure that he has not had an improvement in his ejection fraction due to optimal medical therapy.  2.  Coronary artery disease: Status post CABG.  Has had a recent RCA intervention.  Currently  on Brilinta and aspirin.  3.  Hyperlipidemia: Continue statin per primary cardiology  4.  CKD stage IIIa: Stable on recent labs.   Current medicines are reviewed at length with the patient today.   The patient does not have concerns regarding his medicines.  The following changes were made today: None  Labs/ tests ordered today include:  Orders Placed This Encounter  Procedures   Pro b natriuretic peptide (BNP)   EKG 12-Lead   ECHOCARDIOGRAM COMPLETE     Disposition:   FU with Elden Brucato 3 months  Signed, Jayley Hustead Jorja Loa, MD  07/07/2022 10:23 AM     [facility] [address] [place] [address] [phone] (office) [phone] (fax)

## 2022-07-08 ENCOUNTER — Telehealth: Payer: Self-pay

## 2022-07-08 DIAGNOSIS — I429 Cardiomyopathy, unspecified: Secondary | ICD-10-CM

## 2022-07-08 LAB — PRO B NATRIURETIC PEPTIDE: NT-Pro BNP: 1871 pg/mL — ABNORMAL HIGH (ref 0–376)

## 2022-07-08 MED ORDER — POTASSIUM CHLORIDE CRYS ER 20 MEQ PO TBCR: EXTENDED_RELEASE_TABLET | ORAL | 0 refills | Status: AC

## 2022-07-08 MED ORDER — FUROSEMIDE 40 MG PO TABS: ORAL_TABLET | ORAL | 0 refills | Status: AC

## 2022-07-08 NOTE — Telephone Encounter (Signed)
Outreach made to schedule Pt's BIV ICD.  Pt scheduled for November 05, 2022 at 2:30 pm.  He will get lab work at the [facility] office November 03, 2022.  Work up [employer] mailed to Pt.  Pt asking about lab results from OV yesterday.  He states he was going to be prescribed medication based on results.  Results reviewed by Dr. Elberta Fortis.  Advises Pt take lasix 40 mg one tablet by mouth x 5 days along with potassium 20 meq one tablet by mouth mouth x 5 days.  Pt aware.  Further medication management per primary cardiology team.

## 2022-07-14 NOTE — Telephone Encounter (Signed)
Lab results from office visit reviewed by Dr. Elberta Fortis and medications were prescribed - see note from Roney Mans below below. Patient had no further questions at this time.

## 2022-07-22 ENCOUNTER — Encounter: Payer: Self-pay | Admitting: *Deleted

## 2022-07-22 DIAGNOSIS — Z006 Encounter for examination for normal comparison and control in clinical research program: Secondary | ICD-10-CM

## 2022-07-22 NOTE — Research (Signed)
[id number], SOS-AMI     FOLLOW-UP PHONE CALLS  SUBJECT ID:  [id number] Trainer's name: Loletha Carrow RN Trainer's signature: on Delegation of Authority Log Date of the Follow up call:           [id number]  Visit Number: [id number] Start time of the Follow up call: 09:05                End time of call: 09:15   - CONTACT   Q1;  Was the follow up phone call done with the subject?  [x]   YES  []   NO           If NO, check the following:      Q2:    Was the follow up phone call done with a family member               Or caregiver?    []   YES   [x]   NO          Make note about reasons ___________________________________    AUTOINJECTOR LABEL:  Still legible?  [x]   Yes  []   No  - MEDICAL CONDITION      Q3:  Did any of the following occur?   []   Death       []   Hospitalization (any cause)     No events noted.    []   Use of autoinjector  Make note about the type of event, and when it occurred. In case of hospitalization: the Location of the hospital and/or the treating physician's contact details  ____________ ____________________________________________________________________ ____________________________________________________________________  Note: remember to report any SAE/AE which has occurred within 30 days after any  injection of the study drug on relevant eCRF forms.   Q4:  Did the subject develop any condition which is an          exclusion criterion?  []   YES  [x]   NO   Take note about the occurrence of any exclusion criterion after randomization: _________________________________________________________________ __________________________________________________________________  Q5: Was there any change in subject's antithrombotic therapy?   []   YES  [x]   NO     Take not about any change of antithrombotic treatment:  _______________________ ____________________________________________________________________ ____________________________________________________________________      OF THE STUDY-SPECFIC TRAINING  Q6: Did the subject correctly reply to the following questions?       A.  What are common heart attack symptoms?      [x]   YES   []   NO      B.  What has to be done in case any of those symptoms occurs? [x]   YES  []   NO      C.  What are the main steps to perform a self-injection?  [x]   YES  []   NO             If NO, then report which step/s was/were missing:        []   Choose injection site (abdomen or thigh)       []   Twist cap off       []   Pinch skin and place the study autoinjector       []   Firmly push down and hold for 3 seconds       D. What has to be done immediately after an injection?  [x]   YES   []   NO          If NO, then report which step/s was/were missing:       []   Call  for emergency medical help       []   Show the autoinjector to the emergency medical responder       E.  Does the subject recall where s/he keeps/ stores the autoinjectors? [x]  YES  []  NO          Note the place of storage and any corrective explanation if needed below __________________________________________________________________ __________________________________________________________________  - TRAINING REFRESHER   Q7:  Is a training refresher needed?      []  YES  []  NO        If YES, indicate items that have to be refreshed. More than one may apply:               []   Heart attack symptoms             []   Actions to be taken following heart attack symptoms             []   Steps to perform the self-injection and follow-up actions to be taken   []   Other, Specify  _________________________________________      Current Outpatient Medications:    albuterol (VENTOLIN HFA) 108 (90 Base) MCG/ACT inhaler, Inhale 2 puffs into the lungs every 6 (six) hours as needed for shortness of  breath., Disp: , Rfl:    aspirin EC 81 MG tablet, Take 1 tablet (81 mg total) by mouth daily., Disp: 90 tablet, Rfl: 3   carvedilol (COREG) 6.25 MG tablet, Take 1 tablet (6.25 mg total) by mouth 2 (two) times daily with a meal., Disp: 60 tablet, Rfl: 0   clopidogrel (PLAVIX) 75 MG tablet, Take 1 tablet (75 mg total) by mouth daily., Disp: 90 tablet, Rfl: 3   ezetimibe (ZETIA) 10 MG tablet, TAKE 1 TABLET BY MOUTH EVERY DAY (Patient taking differently: Take 10 mg by mouth daily.), Disp: 30 tablet, Rfl: 0   furosemide (LASIX) 40 MG tablet, Take one tablet by mouth daily x 5 days, Disp: 5 tablet, Rfl: 0   JARDIANCE 10 MG TABS tablet, TAKE 1 TABLET BY MOUTH EVERY DAY (Patient taking differently: Take 10 mg by mouth daily.), Disp: 30 tablet, Rfl: 0   potassium chloride SA (KLOR-CON M20) 20 MEQ tablet, Take one tablet by mouth daily x 5 days, Disp: 5 tablet, Rfl: 0   sacubitril-valsartan (ENTRESTO) 97-103 MG, Take 1 tablet by mouth 2 (two) times daily., Disp: 60 tablet, Rfl: 12   spironolactone (ALDACTONE) 25 MG tablet, TAKE ONE TABLET BY MOUTH EVERY DAY (Patient taking differently: Take 25 mg by mouth daily.), Disp: 90 tablet, Rfl: 3   Study - SOS-AMI - selatogrel 16 mg/0.5 mL or placebo SQ injection (PI-Christopher), Inject 16 mg into the skin as needed. Inject 0.5 mL subcutaneously in the abdomen or thigh if you experience symptoms of a heart attack. Call 911 immediately and seek emergency medical support., Disp: , Rfl:

## 2022-09-08 ENCOUNTER — Ambulatory Visit: Payer: Medicare Other | Attending: Cardiology

## 2022-09-08 DIAGNOSIS — I5022 Chronic systolic (congestive) heart failure: Secondary | ICD-10-CM | POA: Diagnosis not present

## 2022-09-08 DIAGNOSIS — I429 Cardiomyopathy, unspecified: Secondary | ICD-10-CM | POA: Diagnosis not present

## 2022-09-08 LAB — ECHOCARDIOGRAM COMPLETE
Area-P 1/2: 4.21 cm2
Calc EF: 24.3 %
S' Lateral: 4.8 cm
Single Plane A2C EF: 17.8 %
Single Plane A4C EF: 26.5 %

## 2022-09-16 ENCOUNTER — Telehealth: Payer: Self-pay | Admitting: *Deleted

## 2022-09-16 DIAGNOSIS — Z006 Encounter for examination for normal comparison and control in clinical research program: Secondary | ICD-10-CM

## 2022-09-16 NOTE — Telephone Encounter (Addendum)
I left message for patient to schedule his SOS AMI-Study starting Nov. 20. We need to switch his study medications out. Patient called back and scheduled patient Nov. 20 at 11 am.

## 2022-10-21 DIAGNOSIS — E785 Hyperlipidemia, unspecified: Secondary | ICD-10-CM

## 2022-10-21 DIAGNOSIS — I1310 Hypertensive heart and chronic kidney disease without heart failure, with stage 1 through stage 4 chronic kidney disease, or unspecified chronic kidney disease: Secondary | ICD-10-CM

## 2022-10-21 DIAGNOSIS — I2510 Atherosclerotic heart disease of native coronary artery without angina pectoris: Secondary | ICD-10-CM

## 2022-10-21 DIAGNOSIS — I255 Ischemic cardiomyopathy: Secondary | ICD-10-CM

## 2022-10-21 DIAGNOSIS — I361 Nonrheumatic tricuspid (valve) insufficiency: Secondary | ICD-10-CM

## 2022-10-21 DIAGNOSIS — I5023 Acute on chronic systolic (congestive) heart failure: Secondary | ICD-10-CM

## 2022-10-21 DIAGNOSIS — D649 Anemia, unspecified: Secondary | ICD-10-CM

## 2022-10-21 DIAGNOSIS — I340 Nonrheumatic mitral (valve) insufficiency: Secondary | ICD-10-CM

## 2022-10-22 DIAGNOSIS — I2510 Atherosclerotic heart disease of native coronary artery without angina pectoris: Secondary | ICD-10-CM | POA: Diagnosis not present

## 2022-10-22 DIAGNOSIS — I255 Ischemic cardiomyopathy: Secondary | ICD-10-CM | POA: Diagnosis not present

## 2022-10-22 DIAGNOSIS — I5023 Acute on chronic systolic (congestive) heart failure: Secondary | ICD-10-CM | POA: Diagnosis not present

## 2022-10-22 DIAGNOSIS — I1310 Hypertensive heart and chronic kidney disease without heart failure, with stage 1 through stage 4 chronic kidney disease, or unspecified chronic kidney disease: Secondary | ICD-10-CM | POA: Diagnosis not present

## 2022-10-28 ENCOUNTER — Telehealth: Payer: Self-pay | Admitting: *Deleted

## 2022-10-28 DIAGNOSIS — Z006 Encounter for examination for normal comparison and control in clinical research program: Secondary | ICD-10-CM

## 2022-10-28 NOTE — Telephone Encounter (Signed)
I returned patient 's telephone call from yesterday. I left message for patient to call me back.

## 2022-10-30 ENCOUNTER — Telehealth: Payer: Self-pay | Admitting: *Deleted

## 2022-10-30 DIAGNOSIS — Z006 Encounter for examination for normal comparison and control in clinical research program: Secondary | ICD-10-CM

## 2022-10-30 NOTE — Telephone Encounter (Signed)
I called patient about SOS-AMI study visit. Patient wants to drop out of the study. He has been turned over to hospice care. We will need to get his study medication back from him. I will try to arrange a courier to pick up medication from patient's home. I will call patient when we have courier arranged to let him know then details with the courier.

## 2022-11-03 ENCOUNTER — Telehealth: Payer: Self-pay | Admitting: *Deleted

## 2022-11-03 MED ORDER — VERAPAMIL HCL 2.5 MG/ML IV SOLN
INTRAVENOUS | Status: AC
  Filled 2022-11-03: qty 2

## 2022-11-03 MED ORDER — LIDOCAINE HCL (PF) 1 % IJ SOLN
INTRAMUSCULAR | Status: AC
  Filled 2022-11-03: qty 30

## 2022-11-03 MED ORDER — FENTANYL CITRATE (PF) 100 MCG/2ML IJ SOLN
INTRAMUSCULAR | Status: AC
  Filled 2022-11-03: qty 2

## 2022-11-03 MED ORDER — MIDAZOLAM HCL 2 MG/2ML IJ SOLN
INTRAMUSCULAR | Status: AC
  Filled 2022-11-03: qty 2

## 2022-11-03 MED ORDER — NITROGLYCERIN 1 MG/10 ML FOR IR/CATH LAB
INTRA_ARTERIAL | Status: AC
  Filled 2022-11-03: qty 10

## 2022-11-03 MED ORDER — HEPARIN SODIUM (PORCINE) 1000 UNIT/ML IJ SOLN
INTRAMUSCULAR | Status: AC
Start: 2022-11-03 — End: ?
  Filled 2022-11-03: qty 10

## 2022-11-03 MED ORDER — HEPARIN (PORCINE) IN NACL 1000-0.9 UT/500ML-% IV SOLN
INTRAVENOUS | Status: AC
  Filled 2022-11-03: qty 1000

## 2022-11-03 NOTE — Telephone Encounter (Signed)
Called pt to see why pre procedure lab work did not get completed today. Pt reports that he thought we knew -- but he is under hospice care now. Apologized that we were un aware, informed that I would cancel CRTD implant scheduled for 2 days from now. Pt appreciates my call.  Asked him to call us if there is anything we can do.   Munley aware of pt status.

## 2022-11-05 ENCOUNTER — Encounter (HOSPITAL_COMMUNITY): Admission: RE | Payer: Self-pay | Source: Home / Self Care

## 2022-11-05 ENCOUNTER — Ambulatory Visit (HOSPITAL_COMMUNITY): Admission: RE | Admit: 2022-11-05 | Payer: Medicare Other | Source: Home / Self Care | Admitting: Cardiology

## 2022-11-05 SURGERY — BIV ICD INSERTION CRT-D

## 2022-11-14 ENCOUNTER — Telehealth: Payer: Self-pay | Admitting: *Deleted

## 2022-11-14 DIAGNOSIS — Z006 Encounter for examination for normal comparison and control in clinical research program: Secondary | ICD-10-CM

## 2022-11-14 NOTE — Telephone Encounter (Cosign Needed)
I called patient to say we have arranged for courier to pick up study medication. I spoke to [place] the patient's son. He told me his Dad passed away 03/19/2023. I extended my sympathy to family and thanked him for participating in the study. The courier will pick up study medication on Monday, Dec. !8 at 10 am.

## 2022-11-17 ENCOUNTER — Telehealth: Payer: Self-pay | Admitting: *Deleted

## 2022-11-17 ENCOUNTER — Ambulatory Visit: Payer: Medicare Other | Admitting: Cardiology

## 2022-11-17 DIAGNOSIS — Z006 Encounter for examination for normal comparison and control in clinical research program: Secondary | ICD-10-CM

## 2022-11-17 NOTE — Telephone Encounter (Cosign Needed)
The courier returned SOS-AMI study medication from son. I received medicine from [facility] courier.at 11:00 am. Kits: [phone] Returned kits to pharmacy.

## 2022-11-19 ENCOUNTER — Ambulatory Visit: Payer: Medicare Other

## 2022-12-01 DEATH — deceased

## 2023-02-16 ENCOUNTER — Encounter: Payer: Medicare Other | Admitting: Cardiology
